# Patient Record
Sex: Female | Born: 1935 | Race: White | Hispanic: No | Smoking: Never smoker
Health system: Southern US, Community
[De-identification: ages and names within clinical notes are randomized; demographics above are authoritative.]

## PROBLEM LIST (undated history)

## (undated) DIAGNOSIS — K5792 Diverticulitis of intestine, part unspecified, without perforation or abscess without bleeding: Secondary | ICD-10-CM

## (undated) DIAGNOSIS — M1990 Unspecified osteoarthritis, unspecified site: Secondary | ICD-10-CM

## (undated) DIAGNOSIS — I447 Left bundle-branch block, unspecified: Secondary | ICD-10-CM

## (undated) DIAGNOSIS — J3089 Other allergic rhinitis: Secondary | ICD-10-CM

## (undated) DIAGNOSIS — I509 Heart failure, unspecified: Secondary | ICD-10-CM

## (undated) DIAGNOSIS — R569 Unspecified convulsions: Secondary | ICD-10-CM

## (undated) DIAGNOSIS — T4145XA Adverse effect of unspecified anesthetic, initial encounter: Secondary | ICD-10-CM

## (undated) DIAGNOSIS — R29898 Other symptoms and signs involving the musculoskeletal system: Secondary | ICD-10-CM

## (undated) DIAGNOSIS — R55 Syncope and collapse: Secondary | ICD-10-CM

## (undated) DIAGNOSIS — I10 Essential (primary) hypertension: Secondary | ICD-10-CM

## (undated) DIAGNOSIS — E7800 Pure hypercholesterolemia, unspecified: Secondary | ICD-10-CM

## (undated) DIAGNOSIS — T753XXA Motion sickness, initial encounter: Secondary | ICD-10-CM

## (undated) DIAGNOSIS — C801 Malignant (primary) neoplasm, unspecified: Secondary | ICD-10-CM

## (undated) DIAGNOSIS — T8859XA Other complications of anesthesia, initial encounter: Secondary | ICD-10-CM

## (undated) HISTORY — PX: BACK SURGERY: SHX140

## (undated) HISTORY — PX: ROTATOR CUFF REPAIR: SHX139

## (undated) HISTORY — PX: OTHER SURGICAL HISTORY: SHX169

## (undated) HISTORY — PX: CATARACT EXTRACTION W/ INTRAOCULAR LENS  IMPLANT, BILATERAL: SHX1307

---

## 2006-08-29 HISTORY — PX: HAMMERTOE RECONSTRUCTION WITH WEIL OSTEOTOMY: SHX5631

## 2008-04-26 HISTORY — PX: KNEE ARTHROCENTESIS: SUR44

## 2008-07-03 ENCOUNTER — Emergency Department (HOSPITAL_COMMUNITY): Admission: EM | Admit: 2008-07-03 | Discharge: 2008-07-03 | Payer: Self-pay | Admitting: Emergency Medicine

## 2013-06-24 DIAGNOSIS — R569 Unspecified convulsions: Secondary | ICD-10-CM

## 2013-06-24 HISTORY — DX: Unspecified convulsions: R56.9

## 2014-04-30 DIAGNOSIS — J3081 Allergic rhinitis due to animal (cat) (dog) hair and dander: Secondary | ICD-10-CM | POA: Insufficient documentation

## 2014-06-08 DIAGNOSIS — K5730 Diverticulosis of large intestine without perforation or abscess without bleeding: Secondary | ICD-10-CM | POA: Insufficient documentation

## 2014-08-27 DIAGNOSIS — Z96651 Presence of right artificial knee joint: Secondary | ICD-10-CM | POA: Insufficient documentation

## 2014-08-27 HISTORY — PX: JOINT REPLACEMENT: SHX530

## 2014-09-01 DIAGNOSIS — I447 Left bundle-branch block, unspecified: Secondary | ICD-10-CM | POA: Insufficient documentation

## 2014-12-10 ENCOUNTER — Encounter: Payer: Self-pay | Admitting: *Deleted

## 2014-12-13 ENCOUNTER — Ambulatory Visit: Payer: Medicare Other | Admitting: Anesthesiology

## 2014-12-15 ENCOUNTER — Encounter
Admission: RE | Disposition: A | Discharge status: Home / Self Care | Payer: Self-pay | Source: Ambulatory Visit | Attending: Unknown Physician Specialty

## 2014-12-15 ENCOUNTER — Ambulatory Visit
Admission: RE | Admit: 2014-12-15 | Discharge: 2014-12-15 | Disposition: A | Discharge status: Home / Self Care | Payer: Medicare Other | Source: Ambulatory Visit | Attending: Unknown Physician Specialty | Admitting: Unknown Physician Specialty

## 2014-12-15 DIAGNOSIS — M1990 Unspecified osteoarthritis, unspecified site: Secondary | ICD-10-CM | POA: Insufficient documentation

## 2014-12-15 DIAGNOSIS — M810 Age-related osteoporosis without current pathological fracture: Secondary | ICD-10-CM | POA: Insufficient documentation

## 2014-12-15 DIAGNOSIS — Z880 Allergy status to penicillin: Secondary | ICD-10-CM | POA: Insufficient documentation

## 2014-12-15 DIAGNOSIS — Z888 Allergy status to other drugs, medicaments and biological substances status: Secondary | ICD-10-CM | POA: Insufficient documentation

## 2014-12-15 DIAGNOSIS — Z91018 Allergy to other foods: Secondary | ICD-10-CM | POA: Diagnosis not present

## 2014-12-15 DIAGNOSIS — Z85820 Personal history of malignant melanoma of skin: Secondary | ICD-10-CM | POA: Diagnosis not present

## 2014-12-15 DIAGNOSIS — I10 Essential (primary) hypertension: Secondary | ICD-10-CM | POA: Insufficient documentation

## 2014-12-15 DIAGNOSIS — E785 Hyperlipidemia, unspecified: Secondary | ICD-10-CM | POA: Insufficient documentation

## 2014-12-15 DIAGNOSIS — Z881 Allergy status to other antibiotic agents status: Secondary | ICD-10-CM | POA: Diagnosis not present

## 2014-12-15 DIAGNOSIS — N830 Follicular cyst of ovary: Secondary | ICD-10-CM | POA: Diagnosis not present

## 2014-12-15 DIAGNOSIS — H409 Unspecified glaucoma: Secondary | ICD-10-CM | POA: Diagnosis not present

## 2014-12-15 DIAGNOSIS — L720 Epidermal cyst: Secondary | ICD-10-CM | POA: Diagnosis not present

## 2014-12-15 HISTORY — DX: Pure hypercholesterolemia, unspecified: E78.00

## 2014-12-15 HISTORY — DX: Other allergic rhinitis: J30.89

## 2014-12-15 HISTORY — DX: Left bundle-branch block, unspecified: I44.7

## 2014-12-15 HISTORY — DX: Syncope and collapse: R55

## 2014-12-15 HISTORY — DX: Adverse effect of unspecified anesthetic, initial encounter: T41.45XA

## 2014-12-15 HISTORY — DX: Motion sickness, initial encounter: T75.3XXA

## 2014-12-15 HISTORY — DX: Other complications of anesthesia, initial encounter: T88.59XA

## 2014-12-15 HISTORY — DX: Other symptoms and signs involving the musculoskeletal system: R29.898

## 2014-12-15 HISTORY — DX: Essential (primary) hypertension: I10

## 2014-12-15 HISTORY — DX: Unspecified convulsions: R56.9

## 2014-12-15 HISTORY — PX: MASS EXCISION: SHX2000

## 2014-12-15 HISTORY — DX: Unspecified osteoarthritis, unspecified site: M19.90

## 2014-12-15 SURGERY — MINOR EXCISION OF MASS
Anesthesia: LOCAL | Laterality: Left | Wound class: Clean

## 2014-12-15 MED ORDER — OXYCODONE HCL 5 MG PO TABS: 5.0000 mg | ORAL_TABLET | Freq: Once | ORAL | Status: DC | PRN

## 2014-12-15 MED ORDER — LACTATED RINGERS IV SOLN: 500.0000 mL | INTRAVENOUS | Status: DC

## 2014-12-15 MED ORDER — OXYCODONE-ACETAMINOPHEN 5-325 MG PO TABS: 1.0000 | ORAL_TABLET | ORAL | Status: DC | PRN

## 2014-12-15 MED ORDER — FENTANYL CITRATE (PF) 100 MCG/2ML IJ SOLN: 25.0000 ug | INTRAMUSCULAR | Status: DC | PRN

## 2014-12-15 MED ORDER — BACITRACIN 500 UNIT/GM EX OINT
TOPICAL_OINTMENT | CUTANEOUS | Status: DC | PRN
  Administered 2014-12-15: 1 via TOPICAL

## 2014-12-15 MED ORDER — OXYCODONE HCL 5 MG/5ML PO SOLN: 5.0000 mg | Freq: Once | ORAL | Status: DC | PRN

## 2014-12-15 MED ORDER — DEXAMETHASONE SODIUM PHOSPHATE 4 MG/ML IJ SOLN: 8.0000 mg | Freq: Once | INTRAMUSCULAR | Status: DC | PRN

## 2014-12-15 MED ORDER — LIDOCAINE-EPINEPHRINE 1 %-1:100000 IJ SOLN
INTRAMUSCULAR | Status: DC | PRN
  Administered 2014-12-15: 1 mL

## 2014-12-15 MED ORDER — LACTATED RINGERS IV SOLN: INTRAVENOUS | Status: DC

## 2014-12-15 MED ORDER — ACETAMINOPHEN 160 MG/5ML PO SOLN: 325.0000 mg | ORAL | Status: DC | PRN

## 2014-12-15 MED ORDER — ACETAMINOPHEN 325 MG PO TABS: 325.0000 mg | ORAL_TABLET | ORAL | Status: DC | PRN

## 2014-12-15 SURGICAL SUPPLY — 28 items
APPLICATOR COTTON TIP WD 3 STR (MISCELLANEOUS) IMPLANT
BLADE SURG 15 STRL LF DISP TIS (BLADE) IMPLANT
BLADE SURG 15 STRL SS (BLADE)
CANISTER SUCT 1200ML W/VALVE (MISCELLANEOUS) IMPLANT
CORD BIP STRL DISP 12FT (MISCELLANEOUS) ×2 IMPLANT
DRAPE HEAD BAR (DRAPES) ×2 IMPLANT
DRESSING TELFA 4X3 1S ST N-ADH (GAUZE/BANDAGES/DRESSINGS) IMPLANT
ELECT CAUTERY BLADE TIP 2.5 (TIP)
ELECT CAUTERY NEEDLE 2.0 MIC (NEEDLE) IMPLANT
ELECTRODE CAUTERY BLDE TIP 2.5 (TIP) IMPLANT
GAUZE SPONGE 4X4 12PLY STRL (GAUZE/BANDAGES/DRESSINGS) ×2 IMPLANT
GLOVE BIO SURGEON STRL SZ7.5 (GLOVE) ×4 IMPLANT
LIQUID BAND (GAUZE/BANDAGES/DRESSINGS) IMPLANT
NEEDLE HYPO 25GX1X1/2 BEV (NEEDLE) ×2 IMPLANT
NS IRRIG 500ML POUR BTL (IV SOLUTION) ×2 IMPLANT
PACK DRAPE NASAL/ENT (PACKS) ×2 IMPLANT
PAD GROUND ADULT SPLIT (MISCELLANEOUS) IMPLANT
PENCIL ELECTRO HAND CTR (MISCELLANEOUS) IMPLANT
SOL PREP PVP 2OZ (MISCELLANEOUS) ×2
SOLUTION PREP PVP 2OZ (MISCELLANEOUS) ×1 IMPLANT
STRAP BODY AND KNEE 60X3 (MISCELLANEOUS) ×2 IMPLANT
SUCTION FRAZIER TIP 10 FR DISP (SUCTIONS) IMPLANT
SUT PROLENE 5 0 P 3 (SUTURE) ×2 IMPLANT
SUT VIC AB 4-0 RB1 27 (SUTURE)
SUT VIC AB 4-0 RB1 27X BRD (SUTURE) IMPLANT
SUT VICRYL UND BR 6 0 P 3 (SUTURE) ×2 IMPLANT
SYRINGE 10CC LL (SYRINGE) ×2 IMPLANT
TOWEL OR 17X26 4PK STRL BLUE (TOWEL DISPOSABLE) ×2 IMPLANT

## 2014-12-15 NOTE — Progress Notes (Signed)
Wrote in dressing care instructions on AVS. Dressing may be removed in 1-2 days & then leave open to air. Keep clean & dry, no antibiotic ointment is required. Stitches will be removed at 1 week follow up appt. Follow up appt made for Wednesday 9/28 @ 1045. This all was relayed to pt & spouse, who both verbalize understanding.

## 2014-12-15 NOTE — Op Note (Signed)
12/15/2014  9:45 AM    [name], Maria Go  [id number]   Pre-Op Dx: NEOPLASM UNCERTAIN BEHAVIOR FACE  Post-op Dx: SAME  Proc: Excision of epidermal inclusion cyst left   Surg:  Beverly Gust T  Anes: Local  EBL:  Less than 5 cc  Comp:  None  Findings:  Approximately 1 x 1 cm inclusion cyst  Procedure: Joint was then found 40 area taken to the operating room placed supine position. A cystic mass was identified just lateral and overlying the zygoma on the left there was prepped sterilely a local anesthetic of 1% lidocaine with 1 100,000 units of epinephrine was used to inject around the cyst and incision line was marked in a natural skin crease with the area prepped and draped sterilely 15 blade was used to incise down to the subcutaneous tissues. Short sharp scissors were then used to excise around the cyst removing its entirety. 2 small bleeding points were cauterized using the microbipolar. The wounds and copious irrigated with saline with no active bleeding the incision was closed using a subcutaneous 6-0 Vicryl the skin was closed using 5-0 Prolene patient is a return anesthesia where she was awakened and taken recovery room stable condition.  Specimen: Inclusion cyst  Dispo:   Good  Plan:  Patient will be discharged home for follow-up in one week  MCQUEEN,CHAPMAN T  12/15/2014 9:45 AM

## 2014-12-15 NOTE — Anesthesia Preprocedure Evaluation (Deleted)
Anesthesia Evaluation  Patient identified by MRN, date of birth, ID band Patient awake    Reviewed: Allergy & Precautions, H&P , NPO status , Patient's Chart, lab work & pertinent test results, reviewed documented beta blocker date and time   History of Anesthesia Complications (+) history of anesthetic complications  Airway Mallampati: II  TM Distance: >3 FB Neck ROM: full    Dental no notable dental hx.    Pulmonary neg pulmonary ROS,    Pulmonary exam normal breath sounds clear to auscultation       Cardiovascular Exercise Tolerance: Good hypertension, negative cardio ROS   Rhythm:regular Rate:Normal     Neuro/Psych negative neurological ROS  negative psych ROS   GI/Hepatic negative GI ROS, Neg liver ROS,   Endo/Other  negative endocrine ROS  Renal/GU negative Renal ROS  negative genitourinary   Musculoskeletal   Abdominal   Peds  Hematology negative hematology ROS (+)   Anesthesia Other Findings   Reproductive/Obstetrics negative OB ROS                             Anesthesia Physical Anesthesia Plan  ASA: III  Anesthesia Plan:    Post-op Pain Management:    Induction:   Airway Management Planned:   Additional Equipment:   Intra-op Plan:   Post-operative Plan:   Informed Consent: I have reviewed the patients History and Physical, chart, labs and discussed the procedure including the risks, benefits and alternatives for the proposed anesthesia with the patient or authorized representative who has indicated his/her understanding and acceptance.     Plan Discussed with: CRNA  Anesthesia Plan Comments:         Anesthesia Quick Evaluation

## 2014-12-15 NOTE — H&P (Signed)
  H+P  Reviewed and will be scanned in later. No changes noted. 

## 2014-12-15 NOTE — OR Nursing (Signed)
Vitals at case start 0932 BP 176/83 RR 20 Temp 21C HR 73 Vitals at case end 0945 BP 163/38 RR 20 Temp 21C HR 71

## 2014-12-16 ENCOUNTER — Encounter: Payer: Self-pay | Admitting: Unknown Physician Specialty

## 2014-12-17 LAB — SURGICAL PATHOLOGY

## 2015-03-16 DIAGNOSIS — E782 Mixed hyperlipidemia: Secondary | ICD-10-CM | POA: Insufficient documentation

## 2015-03-16 DIAGNOSIS — I10 Essential (primary) hypertension: Secondary | ICD-10-CM | POA: Insufficient documentation

## 2015-03-24 DIAGNOSIS — I5022 Chronic systolic (congestive) heart failure: Secondary | ICD-10-CM | POA: Insufficient documentation

## 2015-06-25 HISTORY — PX: LUMBAR LAMINECTOMY: SHX95

## 2015-10-11 ENCOUNTER — Encounter: Payer: Self-pay | Admitting: Emergency Medicine

## 2015-10-11 ENCOUNTER — Emergency Department
Admission: EM | Admit: 2015-10-11 | Discharge: 2015-10-12 | Disposition: A | Discharge status: Home / Self Care | Payer: Medicare Other | Attending: Emergency Medicine | Admitting: Emergency Medicine

## 2015-10-11 ENCOUNTER — Emergency Department: Payer: Medicare Other

## 2015-10-11 DIAGNOSIS — M179 Osteoarthritis of knee, unspecified: Secondary | ICD-10-CM | POA: Diagnosis not present

## 2015-10-11 DIAGNOSIS — Z792 Long term (current) use of antibiotics: Secondary | ICD-10-CM | POA: Insufficient documentation

## 2015-10-11 DIAGNOSIS — Z8679 Personal history of other diseases of the circulatory system: Secondary | ICD-10-CM | POA: Diagnosis not present

## 2015-10-11 DIAGNOSIS — I110 Hypertensive heart disease with heart failure: Secondary | ICD-10-CM | POA: Insufficient documentation

## 2015-10-11 DIAGNOSIS — I509 Heart failure, unspecified: Secondary | ICD-10-CM | POA: Insufficient documentation

## 2015-10-11 DIAGNOSIS — Z79899 Other long term (current) drug therapy: Secondary | ICD-10-CM | POA: Diagnosis not present

## 2015-10-11 DIAGNOSIS — T3695XA Adverse effect of unspecified systemic antibiotic, initial encounter: Secondary | ICD-10-CM | POA: Insufficient documentation

## 2015-10-11 DIAGNOSIS — K5792 Diverticulitis of intestine, part unspecified, without perforation or abscess without bleeding: Secondary | ICD-10-CM | POA: Diagnosis not present

## 2015-10-11 DIAGNOSIS — R1032 Left lower quadrant pain: Secondary | ICD-10-CM | POA: Diagnosis present

## 2015-10-11 DIAGNOSIS — T7840XA Allergy, unspecified, initial encounter: Secondary | ICD-10-CM | POA: Diagnosis not present

## 2015-10-11 DIAGNOSIS — T50905A Adverse effect of unspecified drugs, medicaments and biological substances, initial encounter: Secondary | ICD-10-CM

## 2015-10-11 HISTORY — DX: Heart failure, unspecified: I50.9

## 2015-10-11 LAB — BASIC METABOLIC PANEL
ANION GAP: 10 (ref 5–15)
BUN: 11 mg/dL (ref 6–20)
CALCIUM: 9.2 mg/dL (ref 8.9–10.3)
CHLORIDE: 95 mmol/L — AB (ref 101–111)
CO2: 26 mmol/L (ref 22–32)
CREATININE: 0.57 mg/dL (ref 0.44–1.00)
GFR calc Af Amer: >60 mL/min (ref >60)
GFR calc non Af Amer: >60 mL/min (ref >60)
Glucose, Bld: 131 mg/dL — ABNORMAL HIGH (ref 65–99)
POTASSIUM: 3.3 mmol/L — AB (ref 3.5–5.1)
Sodium: 131 mmol/L — ABNORMAL LOW (ref 135–145)

## 2015-10-11 LAB — CBC
HEMATOCRIT: 37.7 % (ref 35.0–47.0)
HEMOGLOBIN: 13.1 g/dL (ref 12.0–16.0)
MCH: 31.3 pg (ref 26.0–34.0)
MCHC: 34.7 g/dL (ref 32.0–36.0)
MCV: 90 fL (ref 80.0–100.0)
Platelets: 302 10*3/uL (ref 150–440)
RBC: 4.19 MIL/uL (ref 3.80–5.20)
RDW: 13.1 % (ref 11.5–14.5)
WBC: 6.5 10*3/uL (ref 3.6–11.0)

## 2015-10-11 MED ORDER — DIATRIZOATE MEGLUMINE & SODIUM 66-10 % PO SOLN
15.0000 mL | Freq: Once | ORAL | Status: AC
  Administered 2015-10-11: 15 mL via ORAL

## 2015-10-11 MED ORDER — IOPAMIDOL (ISOVUE-300) INJECTION 61%
100.0000 mL | Freq: Once | INTRAVENOUS | Status: AC | PRN
  Administered 2015-10-11: 100 mL via INTRAVENOUS

## 2015-10-11 NOTE — Discharge Instructions (Signed)
We believe your symptoms are caused by diverticulitis.  Most of the time this condition (please read through the included information) can be cured with outpatient antibiotics.  Unfortunately you have allergies listed to all of the antibiotics typically used.  As we discussed, rather than causing more potential issues by giving U another medication, we feel you should follow-up with Ms. Jerelene Redden to determine additional treatment options.  We believe he should continue taking the metronidazole (Flagyl) at this time.  Return to the ED if your abdominal pain worsens or fails to improve, you develop bloody vomiting, bloody diarrhea, you are unable to tolerate fluids due to vomiting, fever greater than 101, or other symptoms that concern you.    Diverticulitis Diverticulitis is inflammation or infection of small pouches in your colon that form when you have a condition called diverticulosis. The pouches in your colon are called diverticula. Your colon, or large intestine, is where water is absorbed and stool is formed. Complications of diverticulitis can include:  Bleeding.  Severe infection.  Severe pain.  Perforation of your colon.  Obstruction of your colon. CAUSES  Diverticulitis is caused by bacteria. Diverticulitis happens when stool becomes trapped in diverticula. This allows bacteria to grow in the diverticula, which can lead to inflammation and infection. RISK FACTORS People with diverticulosis are at risk for diverticulitis. Eating a diet that does not include enough fiber from fruits and vegetables may make diverticulitis more likely to develop. SYMPTOMS  Symptoms of diverticulitis may include:  Abdominal pain and tenderness. The pain is normally located on the left side of the abdomen, but may occur in other areas.  Fever and chills.  Bloating.  Cramping.  Nausea.  Vomiting.  Constipation.  Diarrhea.  Blood in your stool. DIAGNOSIS  Your health care provider will ask  you about your medical history and do a physical exam. You may need to have tests done because many medical conditions can cause the same symptoms as diverticulitis. Tests may include:  Blood tests.  Urine tests.  Imaging tests of the abdomen, including X-rays and CT scans. When your condition is under control, your health care provider may recommend that you have a colonoscopy. A colonoscopy can show how severe your diverticula are and whether something else is causing your symptoms. TREATMENT  Most cases of diverticulitis are mild and can be treated at home. Treatment may include:  Taking over-the-counter pain medicines.  Following a clear liquid diet.  Taking antibiotic medicines by mouth for 7-10 days. More severe cases may be treated at a hospital. Treatment may include:  Not eating or drinking.  Taking prescription pain medicine.  Receiving antibiotic medicines through an IV tube.  Receiving fluids and nutrition through an IV tube.  Surgery. HOME CARE INSTRUCTIONS   Follow your health care provider's instructions carefully.  Follow a full liquid diet or other diet as directed by your health care provider. After your symptoms improve, your health care provider may tell you to change your diet. He or she may recommend you eat a high-fiber diet. Fruits and vegetables are good sources of fiber. Fiber makes it easier to pass stool.  Take fiber supplements or probiotics as directed by your health care provider.  Only take medicines as directed by your health care provider.  Keep all your follow-up appointments. SEEK MEDICAL CARE IF:   Your pain does not improve.  You have a hard time eating food.  Your bowel movements do not return to normal. SEEK IMMEDIATE MEDICAL CARE IF:  Your pain becomes worse.  Your symptoms do not get better.  Your symptoms suddenly get worse.  You have a fever.  You have repeated vomiting.  You have bloody or black, tarry  stools. MAKE SURE YOU:   Understand these instructions.  Will watch your condition.  Will get help right away if you are not doing well or get worse. Document Released: 12/20/2004 Document Revised: 03/17/2013 Document Reviewed: 02/04/2013 [facility] Patient Information 2015 [place], [place]. This information is not intended to replace advice given to you by your health care provider. Make sure you discuss any questions you have with your health care provider.

## 2015-10-11 NOTE — ED Notes (Signed)
Pt arrived to the ED accompanied by her daughter for an allergic reaction. Pt states that she just started a new medication and started to feel itchy all over with tingling on her lips. Pt is AOx4 anxious. Pt taken to a room.

## 2015-10-11 NOTE — ED Provider Notes (Signed)
[facility] Emergency Department Provider Note  ____________________________________________  Time seen: Approximately 8:14 PM  I have reviewed the triage vital signs and the nursing notes.   HISTORY  Chief Complaint Allergic Reaction    HPI Maria [name] is a 82 y.o. female with an extensive history of drug allergies who presents for evaluation of what is apparently acute onset drug reaction.She reports that she has a number of drug allergies but she has never had Bactrim/Septra in the past.  She has had a history of diverticulitis and has been having left lower quadrant abdominal pain for some time now.  She was being treated empirically for diverticulitis without any imaging being available.  She was originally on Levaquin and Flagyl, but reportedly her nurse practitioner at the GI clinic today and recommended that she try Bactrim to see if that would work.  She reports that about 15 minutes after taking Bactrim she felt flushing to her face, tingling on her lips, itching all over, all of which she associates with allergic reaction to taking medications.  She has had no difficulty breathing, no difficulty swallowing, no change in voice, and denies chest pain, shortness of breath, abdominal pain, nausea/vomiting/diarrhea.  She describes the symptoms as moderate.    Abd pain is currently mild, worse with palpation.  Has been steady for "quick awhile".  Nothing makes better, nothing makes worse.   Past Medical History  Diagnosis Date  . Complication of anesthesia     Pt reports "coded" during spinal injection during back surgery - 8 yrs ago - [place], [place]  . Vasovagal episode   . Seizures ([place]) 06/2013    reaction to exposure to allergan (dogs and cats)  . Left bundle branch block (LBBB)   . Hypertension   . Environmental and seasonal allergies   . Arthritis     fingers, knees  . Motion sickness     seasick  . Left arm weakness     positional  .  Hypercholesteremia   . CHF (congestive heart failure) ([place])     There are no active problems to display for this patient.   Past Surgical History  Procedure Laterality Date  . Rotator cuff repair Bilateral   . Cataract extraction w/ intraocular lens  implant, bilateral    . Back surgery      multiple  . Joint replacement      knee  . Mass excision Left 12/15/2014    Procedure: MINOR EXCISION OF MASS LEFT INCLUSION CYST;  Surgeon: Beverly Gust, MD;  Location: [place];  Service: ENT;  Laterality: Left;  NEEDS 2 NURSE    Current Outpatient Rx  Name  Route  Sig  Dispense  Refill  . acetaminophen (TYLENOL) 500 MG tablet   Oral   Take 500-1,000 mg by mouth 2 (two) times daily. 1000 mg every morning and 500 mg at bedtime         . clindamycin (CLEOCIN) 300 MG capsule   Oral   Take 600 mg by mouth once. 60 minutes prior to dental appointment         . latanoprost (XALATAN) 0.005 % ophthalmic solution   Both Eyes   Place 1 drop into both eyes at bedtime.          Marland Kitchen levofloxacin (LEVAQUIN) 750 MG tablet   Oral   Take 750 mg by mouth daily. For 7 days         . metroNIDAZOLE (FLAGYL) 500 MG tablet  Oral   Take 500 mg by mouth 3 (three) times daily. For 7 days         . ramipril (ALTACE) 10 MG capsule   Oral   Take 10 mg by mouth daily.         Marland Kitchen sulfamethoxazole-trimethoprim (BACTRIM DS,SEPTRA DS) 800-160 MG tablet   Oral   Take 1 tablet by mouth 2 (two) times daily. For 7 days         . timolol (TIMOPTIC) 0.5 % ophthalmic solution   Both Eyes   Place 1 drop into both eyes every morning.           Allergies Darvon; Metronidazole; Other; Penicillins; Erythromycin base; Macrobid; Spinach; Sulfamethoxazole-trimethoprim; Tape; and Tetracyclines & related  History reviewed. No pertinent family history.  Social History Social History  Substance Use Topics  . Smoking status: Never Smoker   . Smokeless tobacco: None  . Alcohol Use: No     Review of Systems Constitutional: No fever/chills Eyes: No visual changes. ENT: No sore throat. Cardiovascular: Denies chest pain. Respiratory: Denies shortness of breath. Gastrointestinal: +abdominal pain (essentially chronic).  No nausea, no vomiting.  No diarrhea.  No constipation. Genitourinary: Negative for dysuria. Musculoskeletal: Negative for back pain. Skin: Itching all over, some erythema,  Neurological: Tingling around mouth.  No focal weakness nor numbness.  10-point ROS otherwise negative.  ____________________________________________   PHYSICAL EXAM:  VITAL SIGNS: ED Triage Vitals  Enc Vitals Group     BP 10/11/15 1949 171/72 mmHg     Pulse Rate 10/11/15 1949 73     Resp 10/11/15 1949 18     Temp 10/11/15 1949 98.3 F (36.8 C)     Temp Source 10/11/15 1949 Oral     SpO2 10/11/15 1949 100 %     Weight 10/11/15 1949 128 lb (58.06 kg)     Height 10/11/15 1949 5\' 4"  (1.626 m)     Head Cir --      Peak Flow --      Pain Score 10/11/15 1952 0     Pain Loc --      Pain Edu? --      Excl. in [place]? --     Constitutional: Alert and oriented. Well appearing and in no acute distress. Eyes: Conjunctivae are normal. PERRL. EOMI. Head: Atraumatic. Nose: No congestion/rhinnorhea. Mouth/Throat: Mucous membranes are moist.  Oropharynx non-erythematous. Neck: No stridor.  No meningeal signs.   Cardiovascular: Normal rate, regular rhythm. Good peripheral circulation. Grossly normal heart sounds.   Respiratory: Normal respiratory effort.  No retractions. Lungs CTAB. Gastrointestinal: Soft and nontender. No distention.  Musculoskeletal: No lower extremity tenderness nor edema. No gross deformities of extremities. Neurologic:  Normal speech and language. No gross focal neurologic deficits are appreciated.  Skin:  Skin is warm, dry and intact. No rash noted.   ____________________________________________   LABS (all labs ordered are listed, but only abnormal results  are displayed)  Labs Reviewed  BASIC METABOLIC PANEL - Abnormal; Notable for the following:    Sodium 131 (*)    Potassium 3.3 (*)    Chloride 95 (*)    Glucose, Bld 131 (*)    All other components within normal limits  CBC   ____________________________________________  EKG  None ____________________________________________  RADIOLOGY   Ct Abdomen Pelvis W Contrast  10/11/2015  CLINICAL DATA:  Left lower quadrant abdominal pain EXAM: CT ABDOMEN AND PELVIS WITH CONTRAST TECHNIQUE: Multidetector CT imaging of the abdomen and pelvis was performed using  the standard protocol following bolus administration of intravenous contrast. CONTRAST:  178mL ISOVUE-300 IOPAMIDOL (ISOVUE-300) INJECTION 61% COMPARISON:  None. FINDINGS: Lower chest: No pulmonary nodules or masses identified within the lung bases. No pleural effusion or pneumothorax. Hepatobiliary: No focal liver lesions. No biliary dilatation. The gallbladder is normal. Pancreas: Normal.  No pancreatic ductal dilatation. Spleen: Small calcification at the splenic hilum.  Otherwise normal. Adrenals/Urinary Tract: The adrenal glands are normal. Large left renal cyst measures up to 3.7 cm. The kidneys are otherwise normal. No hydronephrosis or hydroureter. Stomach/Bowel: There is extensive rectosigmoid colonic diverticulosis with focal colonic wall thickening of the sigmoid colon along the left pelvic side wall (series 2, image 53) with surrounding inflammatory infiltration of the adjacent fat. There is trace pelvic fluid. No evidence of abscess formation. No free intraperitoneal air. No dilated small bowel. The remainder of the colon is normal. Stomach is unremarkable. The appendix is normal. Vascular/Lymphatic: There is aortic atherosclerosis. The inferior vena cava, portal vein, splenic vein and superior mesenteric vein are patent. The aorta, bilateral renal arteries, celiac axis and superior mesenteric artery are patent. The visualized iliac  vessels are normal. There are multiple subcentimeter retroperitoneal lymph nodes measuring up to 6 mm. Genitourinary:  The uterus is normal.  The ovaries are unremarkable. Other: Unremarkable extraperitoneal soft tissues. Musculoskeletal: Lumbar spondylosis greatest at L4-L5. No lytic or blastic osseous lesions. Left laminectomies at L3 and L4. IMPRESSION: 1. Sigmoid colonic wall thickening with associated surrounding inflammatory stranding, consistent with acute diverticulitis. No evidence of perforation or abscess formation. 2. Aortic atherosclerosis. Electronically Signed   By: Ulyses Jarred M.D.   On: 10/11/2015 21:52    ____________________________________________   PROCEDURES  Procedure(s) performed:   Procedures   ____________________________________________   INITIAL IMPRESSION / ASSESSMENT AND PLAN / ED COURSE  Pertinent labs & imaging results that were available during my care of the patient were reviewed by me and considered in my medical decision making (see chart for details).  The patient's CT scan confirmed acute diverticulitis.  I looked up with online resources and also spoke in person with Dr. Burt Knack and another one of the emergency medicine physician's about alternative treatment regimens, and she has listed allergies to all of them. (Specifically this includes ciprofloxacin, levofloxacin, penicillins which eliminates the possibility of Augmentin, and moxifloxacin.)  As a result, rather than cause more problems, I encouraged her to continue taking metronidazole and follow up with her nurse practitioner with gastroenterology first thing in the morning to determine the appropriate outpatient regimen.  She and her daughter agree with this plan.  I gave my usual and customary return precautions.      ____________________________________________  FINAL CLINICAL IMPRESSION(S) / ED DIAGNOSES  Final diagnoses:  Acute diverticulitis  Drug reaction, initial encounter      MEDICATIONS GIVEN DURING THIS VISIT:  Medications  diatrizoate meglumine-sodium (GASTROGRAFIN) 66-10 % solution 15 mL (15 mLs Oral Given 10/11/15 2101)  iopamidol (ISOVUE-300) 61 % injection 100 mL (100 mLs Intravenous Contrast Given 10/11/15 2130)     NEW OUTPATIENT MEDICATIONS STARTED DURING THIS VISIT:  Discharge Medication List as of 10/11/2015 11:58 PM        Note:  This document was prepared using Dragon voice recognition software and may include unintentional dictation errors.   Hinda Kehr, MD 10/12/15 [phone]

## 2015-10-17 DIAGNOSIS — K5732 Diverticulitis of large intestine without perforation or abscess without bleeding: Secondary | ICD-10-CM | POA: Insufficient documentation

## 2015-11-06 NOTE — Progress Notes (Signed)
11/07/2015 4:59 PM   Maria [name] [birth year] [id number]  Referring provider: Kirk Ruths, MD [address] [place], [place] [address]  Chief Complaint  Patient presents with  . Recurrent UTI    new patient referred by Denice Paradise NP    HPI: Patient is a 82 -year-old Caucasian female who presents today as a referral from Denice Paradise, NP, for gross hematuria.  Patient was found to have microscopic hematuria over the last year with 4-10 RBC's/hpf.  Patient does have a prior history of microscopic hematuria.    She does has a prior history of recurrent urinary tract infections, but she denies nephrolithiasis, trauma to the genitourinary tract and malignancies of the genitourinary tract.   She does not have a family medical history of nephrolithiasis (mother).  She does not have a family medical history malignancies of the genitourinary tract or hematuria.   Today, she is having symptoms of urgency and nocturia (intermittent) She denies frequency, dysuria, incontinence, hesitancy, intermittency, straining to urinate or a weak urinary stream.  Her UA today demonstrates 3-11 RBC's/hpf.  She not experiencing any suprapubic pain, abdominal pain or flank pain.  She denies any recent fevers, chills, nausea or vomiting.   She did have a CT abd/pelvis with contrast in 09/2015 for LLQ and was found to have diverticulitis and a large left renal cyst (3.7 cm)  She is not a smoker.   She is not exposed to secondhand smoke.  She has not worked with [employer].   Patient had pressured speech and had a difficult time staying on topic during the history taking.     PMH: Past Medical History:  Diagnosis Date  . Arthritis    fingers, knees  . CHF (congestive heart failure) ([place])   . Complication of anesthesia    Pt reports "coded" during spinal injection during back surgery - 8 yrs ago - [place], [place]  . Environmental and seasonal allergies     . Hypercholesteremia   . Hypertension   . Left arm weakness    positional  . Left bundle branch block (LBBB)   . Motion sickness    seasick  . Seizures ([place]) 06/2013   reaction to exposure to allergan (dogs and cats)  . Vasovagal episode     Surgical History: Past Surgical History:  Procedure Laterality Date  . BACK SURGERY     multiple  . CATARACT EXTRACTION W/ INTRAOCULAR LENS  IMPLANT, BILATERAL    . JOINT REPLACEMENT     knee  . MASS EXCISION Left 12/15/2014   Procedure: MINOR EXCISION OF MASS LEFT INCLUSION CYST;  Surgeon: Beverly Gust, MD;  Location: [facility];  Service: ENT;  Laterality: Left;  NEEDS 2 NURSE  . ROTATOR CUFF REPAIR Bilateral     Home Medications:    Medication List       Accurate as of 11/07/15 11:59 PM. Always use your most recent med list.          acetaminophen 500 MG tablet Commonly known as:  TYLENOL Take 500-1,000 mg by mouth 2 (two) times daily. 1000 mg every morning and 500 mg at bedtime   clindamycin 300 MG capsule Commonly known as:  CLEOCIN Take 600 mg by mouth once. 60 minutes prior to dental appointment   latanoprost 0.005 % ophthalmic solution Commonly known as:  XALATAN Place 1 drop into both eyes at bedtime.   levofloxacin 750 MG tablet Commonly known as:  LEVAQUIN  Take 750 mg by mouth daily. For 7 days   metroNIDAZOLE 500 MG tablet Commonly known as:  FLAGYL Take 500 mg by mouth 3 (three) times daily. For 7 days   PROBIOTIC-10 PO Take by mouth.   ramipril 10 MG capsule Commonly known as:  ALTACE Take 10 mg by mouth daily.   sulfamethoxazole-trimethoprim 800-160 MG tablet Commonly known as:  BACTRIM DS,SEPTRA DS Take 1 tablet by mouth 2 (two) times daily. For 7 days   timolol 0.5 % ophthalmic solution Commonly known as:  TIMOPTIC Place 1 drop into both eyes every morning.       Allergies:  Allergies  Allergen Reactions  . Penicillin G Hives    AGE 22 yo PCN allergy  . Sulfa Antibiotics  Other (See Comments) and Anaphylaxis  . Sulfamethoxazole-Trimethoprim Itching, Palpitations, Other (See Comments), Rash and Swelling    Shaking, redness, sneezing  . Articaine-Epinephrine Other (See Comments)  . Darvon [Propoxyphene] Other (See Comments)    Altered mental status  . Erythromycin Other (See Comments)  . Guaifenesin Other (See Comments)    Insomnia and anxiety  . Levofloxacin Other (See Comments)    750mg  dosage after 24 hours caused confusion, off-balanced CNS  . Metronidazole Nausea Only    Gi upset  . Other Other (See Comments)    septocaine caused numbness  . Penicillins Swelling  . Statins Other (See Comments)  . Tetracycline Other (See Comments)  . Erythromycin Base Rash    GI upset  . Macrobid [Nitrofurantoin] Rash  . Spinach Palpitations    Irregular heartbeat  . Tape Rash  . Tetracyclines & Related Rash    Family History: Family History  Problem Relation Age of Onset  . Kidney Stones Mother   . Kidney disease Neg Hx     Social History:  reports that she has never smoked. She does not have any smokeless tobacco history on file. She reports that she does not drink alcohol. Her drug history is not on file.  ROS: UROLOGY Frequent Urination?: No Hard to postpone urination?: Yes Burning/pain with urination?: No Get up at night to urinate?: Yes Leakage of urine?: No Urine stream starts and stops?: No Trouble starting stream?: No Do you have to strain to urinate?: No Blood in urine?: Yes Urinary tract infection?: No Sexually transmitted disease?: No Injury to kidneys or bladder?: No Painful intercourse?: No Weak stream?: No Currently pregnant?: No Vaginal bleeding?: No Last menstrual period?: n  Gastrointestinal Nausea?: Yes Vomiting?: No Indigestion/heartburn?: Yes Diarrhea?: Yes Constipation?: Yes  Constitutional Fever: Yes Night sweats?: No Weight loss?: No Fatigue?: Yes  Skin Skin rash/lesions?: No Itching?: No  Eyes Blurred  vision?: Yes Double vision?: No  Ears/Nose/Throat Sore throat?: No Sinus problems?: No  Hematologic/Lymphatic Swollen glands?: No Easy bruising?: Yes  Cardiovascular Leg swelling?: No Chest pain?: No  Respiratory Cough?: Yes Shortness of breath?: Yes  Endocrine Excessive thirst?: No  Musculoskeletal Back pain?: No Joint pain?: Yes  Neurological Headaches?: No Dizziness?: Yes  Psychologic Depression?: No Anxiety?: No  Physical Exam: BP (!) 166/81   Pulse 64   Temp 97.9 F (36.6 C) (Oral)   Ht 5' 2.5" (1.588 m)   Wt 123 lb 6.4 oz (56 kg)   BMI 22.21 kg/m   Constitutional: Well nourished. Alert and oriented, No acute distress. HEENT: [place] AT, moist mucus membranes. Trachea midline, no masses. Cardiovascular: No clubbing, cyanosis, or edema. Respiratory: Normal respiratory effort, no increased work of breathing. GI: Abdomen is soft, non tender, non  distended, no abdominal masses. Liver and spleen not palpable.  No hernias appreciated.  Stool sample for occult testing is not indicated.   GU: No CVA tenderness.  No bladder fullness or masses.   Skin: No rashes, bruises or suspicious lesions. Lymph: No cervical or inguinal adenopathy. Neurologic: Grossly intact, no focal deficits, moving all 4 extremities. Psychiatric: Normal mood and affect.  Laboratory Data: Lab Results  Component Value Date   WBC 6.5 10/11/2015   HGB 13.1 10/11/2015   HCT 37.7 10/11/2015   MCV 90.0 10/11/2015   PLT 302 10/11/2015    Lab Results  Component Value Date   CREATININE 0.57 10/11/2015    Urinalysis Significant for 3-11 RBC's/hpf.  See EPIC.  Pertinent Imaging: CLINICAL DATA:  Left lower quadrant abdominal pain  EXAM: CT ABDOMEN AND PELVIS WITH CONTRAST  TECHNIQUE: Multidetector CT imaging of the abdomen and pelvis was performed using the standard protocol following bolus administration of intravenous contrast.  CONTRAST:  125mL ISOVUE-300 IOPAMIDOL  (ISOVUE-300) INJECTION 61%  COMPARISON:  None.  FINDINGS: Lower chest: No pulmonary nodules or masses identified within the lung bases. No pleural effusion or pneumothorax.  Hepatobiliary: No focal liver lesions. No biliary dilatation. The gallbladder is normal.  Pancreas: Normal.  No pancreatic ductal dilatation.  Spleen: Small calcification at the splenic hilum.  Otherwise normal.  Adrenals/Urinary Tract: The adrenal glands are normal. Large left renal cyst measures up to 3.7 cm. The kidneys are otherwise normal. No hydronephrosis or hydroureter.  Stomach/Bowel: There is extensive rectosigmoid colonic diverticulosis with focal colonic wall thickening of the sigmoid colon along the left pelvic side wall (series 2, image 53) with surrounding inflammatory infiltration of the adjacent fat. There is trace pelvic fluid. No evidence of abscess formation. No free intraperitoneal air. No dilated small bowel. The remainder of the colon is normal. Stomach is unremarkable. The appendix is normal.  Vascular/Lymphatic: There is aortic atherosclerosis. The inferior vena cava, portal vein, splenic vein and superior mesenteric vein are patent. The aorta, bilateral renal arteries, celiac axis and superior mesenteric artery are patent. The visualized iliac vessels are normal. There are multiple subcentimeter retroperitoneal lymph nodes measuring up to 6 mm.  Genitourinary:  The uterus is normal.  The ovaries are unremarkable.  Other: Unremarkable extraperitoneal soft tissues.  Musculoskeletal: Lumbar spondylosis greatest at L4-L5. No lytic or blastic osseous lesions. Left laminectomies at L3 and L4.  IMPRESSION: 1. Sigmoid colonic wall thickening with associated surrounding inflammatory stranding, consistent with acute diverticulitis. No evidence of perforation or abscess formation. 2. Aortic atherosclerosis.   Electronically Signed   By: Ulyses Jarred M.D.   On:  10/11/2015 21:52   Assessment & Plan:    1. Gross hematuria  - explained to the patient that hematuria may be the result of urinary stones, infection and/or urological tumors  - patient has had a CT abd/pelvis w contrast which noted large left renal cyst measures up to 3.7 cm  - discussed with the possibility of missing renal/ureteral cancer without performing a CT Urogram  - offered cystoscopy with retrogrades or CT Urogram, but she would like to hold on CT Urogram or cystoscopy in the OR with retrogrades at this time  - schedule cystoscopy in the office  - UA  - urine culture  Return for cystoscopy for hematuria.  These notes generated with voice recognition software. I apologize for typographical errors.  Zara Council, [facility] [address], [place], [place] [address] [phone]

## 2015-11-07 ENCOUNTER — Encounter: Payer: Self-pay | Admitting: Urology

## 2015-11-07 ENCOUNTER — Ambulatory Visit (INDEPENDENT_AMBULATORY_CARE_PROVIDER_SITE_OTHER): Payer: Medicare Other | Admitting: Urology

## 2015-11-07 VITALS — BP 166/81 | HR 64 | Temp 97.9°F | Ht 62.5 in | Wt 123.4 lb

## 2015-11-07 DIAGNOSIS — R310 Gross hematuria: Secondary | ICD-10-CM

## 2015-11-07 LAB — URINALYSIS, COMPLETE
Bilirubin, UA: NEGATIVE
Glucose, UA: NEGATIVE
Ketones, UA: NEGATIVE
LEUKOCYTES UA: NEGATIVE
Nitrite, UA: NEGATIVE
PH UA: 7.5 (ref 5.0–7.5)
PROTEIN UA: NEGATIVE
Specific Gravity, UA: 1.01 (ref 1.005–1.030)
Urobilinogen, Ur: 0.2 mg/dL (ref 0.2–1.0)

## 2015-11-07 LAB — MICROSCOPIC EXAMINATION
BACTERIA UA: NONE SEEN
WBC, UA: NONE SEEN /hpf (ref 0–?)

## 2015-11-10 LAB — CULTURE, URINE COMPREHENSIVE

## 2015-12-08 ENCOUNTER — Ambulatory Visit: Payer: Medicare Other | Admitting: Family Medicine

## 2015-12-08 VITALS — BP 151/80 | HR 60 | Ht 62.5 in | Wt 125.3 lb

## 2015-12-08 DIAGNOSIS — R310 Gross hematuria: Secondary | ICD-10-CM

## 2015-12-08 LAB — URINALYSIS, COMPLETE
Bilirubin, UA: NEGATIVE
Glucose, UA: NEGATIVE
KETONES UA: NEGATIVE
Nitrite, UA: POSITIVE — AB
SPEC GRAV UA: 1.015 (ref 1.005–1.030)
Urobilinogen, Ur: 0.2 mg/dL (ref 0.2–1.0)
pH, UA: 6.5 (ref 5.0–7.5)

## 2015-12-08 LAB — MICROSCOPIC EXAMINATION: WBC, UA: >30 /hpf — AB (ref 0–?)

## 2015-12-08 NOTE — Progress Notes (Signed)
Patient present today with urinary frequency, burning, blood in urine. She complains of fever chills and urinay urgency. She has been on antibiotics in the last 30 days. She is not currently on abx, no Urological surgeries in the last 30days.

## 2015-12-10 ENCOUNTER — Encounter: Payer: Self-pay | Admitting: Emergency Medicine

## 2015-12-10 ENCOUNTER — Inpatient Hospital Stay
Admission: EM | Admit: 2015-12-10 | Discharge: 2015-12-13 | DRG: 872 | Disposition: A | Discharge status: Skilled Nursing Facility | Payer: Medicare Other | Attending: Internal Medicine | Admitting: Internal Medicine

## 2015-12-10 DIAGNOSIS — D649 Anemia, unspecified: Secondary | ICD-10-CM | POA: Diagnosis present

## 2015-12-10 DIAGNOSIS — N183 Chronic kidney disease, stage 3 (moderate): Secondary | ICD-10-CM | POA: Diagnosis present

## 2015-12-10 DIAGNOSIS — M1990 Unspecified osteoarthritis, unspecified site: Secondary | ICD-10-CM | POA: Diagnosis present

## 2015-12-10 DIAGNOSIS — N12 Tubulo-interstitial nephritis, not specified as acute or chronic: Secondary | ICD-10-CM | POA: Diagnosis present

## 2015-12-10 DIAGNOSIS — E871 Hypo-osmolality and hyponatremia: Secondary | ICD-10-CM | POA: Diagnosis present

## 2015-12-10 DIAGNOSIS — Z9842 Cataract extraction status, left eye: Secondary | ICD-10-CM | POA: Diagnosis not present

## 2015-12-10 DIAGNOSIS — H409 Unspecified glaucoma: Secondary | ICD-10-CM | POA: Diagnosis present

## 2015-12-10 DIAGNOSIS — I509 Heart failure, unspecified: Secondary | ICD-10-CM | POA: Diagnosis present

## 2015-12-10 DIAGNOSIS — Z79899 Other long term (current) drug therapy: Secondary | ICD-10-CM | POA: Diagnosis not present

## 2015-12-10 DIAGNOSIS — Z9889 Other specified postprocedural states: Secondary | ICD-10-CM

## 2015-12-10 DIAGNOSIS — Z888 Allergy status to other drugs, medicaments and biological substances status: Secondary | ICD-10-CM

## 2015-12-10 DIAGNOSIS — Z961 Presence of intraocular lens: Secondary | ICD-10-CM | POA: Diagnosis present

## 2015-12-10 DIAGNOSIS — E860 Dehydration: Secondary | ICD-10-CM | POA: Diagnosis present

## 2015-12-10 DIAGNOSIS — N179 Acute kidney failure, unspecified: Secondary | ICD-10-CM | POA: Diagnosis present

## 2015-12-10 DIAGNOSIS — N10 Acute tubulo-interstitial nephritis: Secondary | ICD-10-CM | POA: Diagnosis present

## 2015-12-10 DIAGNOSIS — Z9841 Cataract extraction status, right eye: Secondary | ICD-10-CM

## 2015-12-10 DIAGNOSIS — I248 Other forms of acute ischemic heart disease: Secondary | ICD-10-CM | POA: Diagnosis present

## 2015-12-10 DIAGNOSIS — B9620 Unspecified Escherichia coli [E. coli] as the cause of diseases classified elsewhere: Secondary | ICD-10-CM | POA: Diagnosis present

## 2015-12-10 DIAGNOSIS — A4151 Sepsis due to Escherichia coli [E. coli]: Secondary | ICD-10-CM | POA: Diagnosis present

## 2015-12-10 DIAGNOSIS — R7989 Other specified abnormal findings of blood chemistry: Secondary | ICD-10-CM

## 2015-12-10 DIAGNOSIS — I130 Hypertensive heart and chronic kidney disease with heart failure and stage 1 through stage 4 chronic kidney disease, or unspecified chronic kidney disease: Secondary | ICD-10-CM | POA: Diagnosis present

## 2015-12-10 DIAGNOSIS — R778 Other specified abnormalities of plasma proteins: Secondary | ICD-10-CM

## 2015-12-10 DIAGNOSIS — N200 Calculus of kidney: Secondary | ICD-10-CM

## 2015-12-10 DIAGNOSIS — Z841 Family history of disorders of kidney and ureter: Secondary | ICD-10-CM | POA: Diagnosis not present

## 2015-12-10 DIAGNOSIS — Z880 Allergy status to penicillin: Secondary | ICD-10-CM

## 2015-12-10 DIAGNOSIS — E876 Hypokalemia: Secondary | ICD-10-CM | POA: Diagnosis present

## 2015-12-10 DIAGNOSIS — Z882 Allergy status to sulfonamides status: Secondary | ICD-10-CM | POA: Diagnosis not present

## 2015-12-10 DIAGNOSIS — Z96659 Presence of unspecified artificial knee joint: Secondary | ICD-10-CM | POA: Diagnosis present

## 2015-12-10 DIAGNOSIS — N289 Disorder of kidney and ureter, unspecified: Secondary | ICD-10-CM

## 2015-12-10 LAB — CBC WITH DIFFERENTIAL/PLATELET
BASOS PCT: 0 %
Basophils Absolute: 0 10*3/uL (ref 0–0.1)
Eosinophils Absolute: 0 10*3/uL (ref 0–0.7)
Eosinophils Relative: 0 %
HEMATOCRIT: 37.5 % (ref 35.0–47.0)
HEMOGLOBIN: 13 g/dL (ref 12.0–16.0)
LYMPHS ABS: 0.2 10*3/uL — AB (ref 1.0–3.6)
LYMPHS PCT: 5 %
MCH: 31.2 pg (ref 26.0–34.0)
MCHC: 34.7 g/dL (ref 32.0–36.0)
MCV: 90 fL (ref 80.0–100.0)
MONOS PCT: 2 %
Monocytes Absolute: 0.1 10*3/uL — ABNORMAL LOW (ref 0.2–0.9)
NEUTROS ABS: 4.7 10*3/uL (ref 1.4–6.5)
NEUTROS PCT: 93 %
Platelets: 213 10*3/uL (ref 150–440)
RBC: 4.17 MIL/uL (ref 3.80–5.20)
RDW: 13.5 % (ref 11.5–14.5)
WBC: 5 10*3/uL (ref 3.6–11.0)

## 2015-12-10 LAB — COMPREHENSIVE METABOLIC PANEL
ALT: 24 U/L (ref 14–54)
AST: 42 U/L — AB (ref 15–41)
Albumin: 3.8 g/dL (ref 3.5–5.0)
Alkaline Phosphatase: 90 U/L (ref 38–126)
Anion gap: 10 (ref 5–15)
BILIRUBIN TOTAL: 1.4 mg/dL — AB (ref 0.3–1.2)
BUN: 20 mg/dL (ref 6–20)
CO2: 22 mmol/L (ref 22–32)
CREATININE: 1.21 mg/dL — AB (ref 0.44–1.00)
Calcium: 8.6 mg/dL — ABNORMAL LOW (ref 8.9–10.3)
Chloride: 96 mmol/L — ABNORMAL LOW (ref 101–111)
GFR, EST AFRICAN AMERICAN: 47 mL/min — AB (ref >60)
GFR, EST NON AFRICAN AMERICAN: 40 mL/min — AB (ref >60)
Glucose, Bld: 132 mg/dL — ABNORMAL HIGH (ref 65–99)
Potassium: 3.2 mmol/L — ABNORMAL LOW (ref 3.5–5.1)
Sodium: 128 mmol/L — ABNORMAL LOW (ref 135–145)
TOTAL PROTEIN: 6.9 g/dL (ref 6.5–8.1)

## 2015-12-10 LAB — URINALYSIS COMPLETE WITH MICROSCOPIC (ARMC ONLY)
Specific Gravity, Urine: 1.022 (ref 1.005–1.030)
Squamous Epithelial / LPF: NONE SEEN

## 2015-12-10 LAB — TROPONIN I
TROPONIN I: 0.12 ng/mL — AB (ref <0.03)
Troponin I: 0.06 ng/mL (ref <0.03)

## 2015-12-10 LAB — GLUCOSE, CAPILLARY: Glucose-Capillary: 133 mg/dL — ABNORMAL HIGH (ref 65–99)

## 2015-12-10 LAB — MAGNESIUM: MAGNESIUM: 1.5 mg/dL — AB (ref 1.7–2.4)

## 2015-12-10 LAB — LACTIC ACID, PLASMA
LACTIC ACID, VENOUS: 2.1 mmol/L — AB (ref 0.5–1.9)
Lactic Acid, Venous: 1.3 mmol/L (ref 0.5–1.9)

## 2015-12-10 LAB — PHOSPHORUS: Phosphorus: 2.1 mg/dL — ABNORMAL LOW (ref 2.5–4.6)

## 2015-12-10 MED ORDER — ASPIRIN EC 325 MG PO TBEC
325.0000 mg | DELAYED_RELEASE_TABLET | Freq: Once | ORAL | Status: DC
  Filled 2015-12-10: qty 1

## 2015-12-10 MED ORDER — SENNOSIDES-DOCUSATE SODIUM 8.6-50 MG PO TABS: 1.0000 | ORAL_TABLET | Freq: Every evening | ORAL | Status: DC | PRN

## 2015-12-10 MED ORDER — ONDANSETRON HCL 4 MG PO TABS
4.0000 mg | ORAL_TABLET | Freq: Four times a day (QID) | ORAL | Status: DC | PRN
  Administered 2015-12-11: 4 mg via ORAL
  Filled 2015-12-10: qty 1

## 2015-12-10 MED ORDER — LEVOFLOXACIN IN D5W 500 MG/100ML IV SOLN: 500.0000 mg | Freq: Once | INTRAVENOUS | Status: DC

## 2015-12-10 MED ORDER — NITROGLYCERIN 0.4 MG SL SUBL: 0.4000 mg | SUBLINGUAL_TABLET | SUBLINGUAL | Status: DC | PRN

## 2015-12-10 MED ORDER — LEVOFLOXACIN IN D5W 250 MG/50ML IV SOLN
250.0000 mg | Freq: Once | INTRAVENOUS | Status: AC
  Administered 2015-12-10: 250 mg via INTRAVENOUS
  Filled 2015-12-10: qty 50

## 2015-12-10 MED ORDER — POTASSIUM CHLORIDE CRYS ER 20 MEQ PO TBCR
20.0000 meq | EXTENDED_RELEASE_TABLET | Freq: Two times a day (BID) | ORAL | Status: DC
  Administered 2015-12-11: 20 meq via ORAL
  Filled 2015-12-10: qty 1

## 2015-12-10 MED ORDER — ACETAMINOPHEN 325 MG PO TABS
650.0000 mg | ORAL_TABLET | Freq: Four times a day (QID) | ORAL | Status: DC | PRN
Start: 2015-12-10 — End: 2015-12-13
  Administered 2015-12-11 – 2015-12-13 (×3): 650 mg via ORAL
  Filled 2015-12-10 (×3): qty 2

## 2015-12-10 MED ORDER — ACETAMINOPHEN 650 MG RE SUPP: 650.0000 mg | Freq: Four times a day (QID) | RECTAL | Status: DC | PRN

## 2015-12-10 MED ORDER — MAGNESIUM CITRATE PO SOLN
1.0000 | Freq: Once | ORAL | Status: DC | PRN
Start: 2015-12-10 — End: 2015-12-13

## 2015-12-10 MED ORDER — ENOXAPARIN SODIUM 30 MG/0.3ML ~~LOC~~ SOLN
30.0000 mg | Freq: Every day | SUBCUTANEOUS | Status: DC
  Administered 2015-12-11: 30 mg via SUBCUTANEOUS
  Filled 2015-12-10: qty 0.3

## 2015-12-10 MED ORDER — ACETAMINOPHEN 500 MG PO TABS: 500.0000 mg | ORAL_TABLET | Freq: Two times a day (BID) | ORAL | Status: DC

## 2015-12-10 MED ORDER — ONDANSETRON HCL 4 MG/2ML IJ SOLN
INTRAMUSCULAR | Status: AC
  Administered 2015-12-10: 4 mg via INTRAVENOUS
  Filled 2015-12-10: qty 2

## 2015-12-10 MED ORDER — ZOLPIDEM TARTRATE 5 MG PO TABS: 5.0000 mg | ORAL_TABLET | Freq: Every evening | ORAL | Status: DC | PRN

## 2015-12-10 MED ORDER — ONDANSETRON HCL 4 MG/2ML IJ SOLN
4.0000 mg | Freq: Four times a day (QID) | INTRAMUSCULAR | Status: DC | PRN
  Administered 2015-12-11: 4 mg via INTRAVENOUS
  Filled 2015-12-10: qty 2

## 2015-12-10 MED ORDER — SODIUM CHLORIDE 0.9% FLUSH
3.0000 mL | Freq: Two times a day (BID) | INTRAVENOUS | Status: DC
  Administered 2015-12-11 – 2015-12-13 (×5): 3 mL via INTRAVENOUS

## 2015-12-10 MED ORDER — METOPROLOL SUCCINATE ER 25 MG PO TB24
12.5000 mg | ORAL_TABLET | Freq: Every day | ORAL | Status: DC
  Administered 2015-12-11 – 2015-12-13 (×3): 12.5 mg via ORAL
  Filled 2015-12-10 (×3): qty 1

## 2015-12-10 MED ORDER — ACETAMINOPHEN 500 MG PO TABS
500.0000 mg | ORAL_TABLET | Freq: Every day | ORAL | Status: DC
  Administered 2015-12-11 – 2015-12-12 (×3): 500 mg via ORAL
  Filled 2015-12-10 (×3): qty 1

## 2015-12-10 MED ORDER — HYDROCODONE-ACETAMINOPHEN 5-325 MG PO TABS
1.0000 | ORAL_TABLET | ORAL | Status: DC | PRN
Start: 2015-12-10 — End: 2015-12-13

## 2015-12-10 MED ORDER — SODIUM CHLORIDE 0.9 % IV SOLN
INTRAVENOUS | Status: DC
  Administered 2015-12-10: via INTRAVENOUS

## 2015-12-10 MED ORDER — SODIUM CHLORIDE 0.9 % IV BOLUS (SEPSIS)
500.0000 mL | Freq: Once | INTRAVENOUS | Status: AC
  Administered 2015-12-10: 500 mL via INTRAVENOUS

## 2015-12-10 MED ORDER — ACETAMINOPHEN 500 MG PO TABS
1000.0000 mg | ORAL_TABLET | Freq: Every day | ORAL | Status: DC
  Administered 2015-12-11 – 2015-12-13 (×3): 1000 mg via ORAL
  Filled 2015-12-10 (×3): qty 2

## 2015-12-10 MED ORDER — BACID PO TABS
2.0000 | ORAL_TABLET | Freq: Three times a day (TID) | ORAL | Status: DC
  Administered 2015-12-11 (×2): 2 via ORAL
  Filled 2015-12-10 (×5): qty 2

## 2015-12-10 MED ORDER — ONDANSETRON HCL 4 MG/2ML IJ SOLN
4.0000 mg | Freq: Once | INTRAMUSCULAR | Status: AC
  Administered 2015-12-10: 4 mg via INTRAVENOUS

## 2015-12-10 MED ORDER — LEVOFLOXACIN 500 MG PO TABS
250.0000 mg | ORAL_TABLET | Freq: Every day | ORAL | Status: DC
  Administered 2015-12-11 – 2015-12-13 (×3): 250 mg via ORAL
  Filled 2015-12-10 (×3): qty 1

## 2015-12-10 MED ORDER — TIMOLOL MALEATE 0.5 % OP SOLN
1.0000 [drp] | Freq: Every day | OPHTHALMIC | Status: DC
  Administered 2015-12-11 – 2015-12-13 (×3): 1 [drp] via OPHTHALMIC
  Filled 2015-12-10: qty 5

## 2015-12-10 MED ORDER — BISACODYL 5 MG PO TBEC: 5.0000 mg | DELAYED_RELEASE_TABLET | Freq: Every day | ORAL | Status: DC | PRN

## 2015-12-10 MED ORDER — PROMETHAZINE HCL 25 MG/ML IJ SOLN
6.2500 mg | Freq: Once | INTRAMUSCULAR | Status: AC
  Administered 2015-12-10: 6.25 mg via INTRAVENOUS
  Filled 2015-12-10: qty 1

## 2015-12-10 MED ORDER — LATANOPROST 0.005 % OP SOLN
1.0000 [drp] | Freq: Every day | OPHTHALMIC | Status: DC
  Administered 2015-12-11 – 2015-12-12 (×2): 1 [drp] via OPHTHALMIC
  Filled 2015-12-10: qty 2.5

## 2015-12-10 NOTE — ED Provider Notes (Addendum)
[facility] Emergency Department Provider Note  ____________________________________________   I have reviewed the triage vital signs and the nursing notes.   HISTORY  Chief Complaint Abdominal Pain    HPI Maria [name] is a 82 y.o. female who presents today complaining of urinary tract infection. Patient states she's been having dysuria or urinary frequency and feeling generally poorly since Wednesday. She saw her urologist who did a urinalysis. She states that she felt that they were thinking that she had a urinary tract infection because of her multiple allergies and intolerances of medications they deferred giving antibiotics until the culture came back. Culture will not be back to early this week however today she began to be diaphoretic having increased lower pelvic discomfort increased dysuria, a little bit of flank pain and vomiting. She feels that her urinary tract infection is getting worse and for that reason she has come in. She denies any diarrhea. She states she does have fever and chills at home. She denies any chest pain or shortness of breath or cough.    Past Medical History:  Diagnosis Date  . Arthritis    fingers, knees  . CHF (congestive heart failure) ([place])   . Complication of anesthesia    Pt reports "coded" during spinal injection during back surgery - 8 yrs ago - [place], [place]  . Environmental and seasonal allergies   . Hypercholesteremia   . Hypertension   . Left arm weakness    positional  . Left bundle branch block (LBBB)   . Motion sickness    seasick  . Seizures ([place]) 06/2013   reaction to exposure to allergan (dogs and cats)  . Vasovagal episode     There are no active problems to display for this patient.   Past Surgical History:  Procedure Laterality Date  . BACK SURGERY     multiple  . CATARACT EXTRACTION W/ INTRAOCULAR LENS  IMPLANT, BILATERAL    . JOINT REPLACEMENT     knee  . MASS EXCISION Left 12/15/2014    Procedure: MINOR EXCISION OF MASS LEFT INCLUSION CYST;  Surgeon: Beverly Gust, MD;  Location: [facility];  Service: ENT;  Laterality: Left;  NEEDS 2 NURSE  . ROTATOR CUFF REPAIR Bilateral     Prior to Admission medications   Medication Sig Start Date End Date Taking? Authorizing Provider  acetaminophen (TYLENOL) 500 MG tablet Take 500-1,000 mg by mouth 2 (two) times daily. 1000 mg every morning and 500 mg at bedtime    Historical Provider, MD  clindamycin (CLEOCIN) 300 MG capsule Take 600 mg by mouth once. 60 minutes prior to dental appointment    Historical Provider, MD  latanoprost (XALATAN) 0.005 % ophthalmic solution Place 1 drop into both eyes at bedtime.     Historical Provider, MD  levofloxacin (LEVAQUIN) 750 MG tablet Take 750 mg by mouth daily. For 7 days 10/08/15   Historical Provider, MD  metroNIDAZOLE (FLAGYL) 500 MG tablet Take 500 mg by mouth 3 (three) times daily. For 7 days 10/11/15   Historical Provider, MD  Probiotic Product (PROBIOTIC-10 PO) Take by mouth.    Historical Provider, MD  ramipril (ALTACE) 10 MG capsule Take 10 mg by mouth daily.    Historical Provider, MD  sulfamethoxazole-trimethoprim (BACTRIM DS,SEPTRA DS) 800-160 MG tablet Take 1 tablet by mouth 2 (two) times daily. For 7 days 10/11/15   Historical Provider, MD  timolol (TIMOPTIC) 0.5 % ophthalmic solution Place 1 drop into both eyes every morning.  Historical Provider, MD    Allergies Penicillin g; Sulfa antibiotics; Sulfamethoxazole-trimethoprim; Articaine-epinephrine; Darvon [propoxyphene]; Erythromycin; Guaifenesin; Levofloxacin; Metronidazole; Other; Penicillins; Statins; Tetracycline; Erythromycin base; Macrobid [nitrofurantoin]; Spinach; Tape; and Tetracyclines & related  Family History  Problem Relation Age of Onset  . Kidney Stones Mother   . Kidney disease Neg Hx     Social History Social History  Substance Use Topics  . Smoking status: Never Smoker  . Smokeless tobacco: Not on  file  . Alcohol use No    Review of Systems Constitutional: Positive fever/chills Eyes: No visual changes. ENT: No sore throat. No stiff neck no neck pain Cardiovascular: Denies chest pain. Respiratory: Denies shortness of breath. Gastrointestinal:   Positive vomiting.  No diarrhea.  No constipation. Genitourinary: Positive for dysuria. Musculoskeletal: Negative lower extremity swelling Skin: Negative for rash. Neurological: Negative for severe headaches, focal weakness or numbness. 10-point ROS otherwise negative.  ____________________________________________   PHYSICAL EXAM:  VITAL SIGNS: ED Triage Vitals [12/10/15 1959]  Enc Vitals Group     BP 121/64     Pulse Rate (!) 103     Resp 17     Temp 98.9 F (37.2 C)     Temp Source Oral     SpO2 95 %     Weight 126 lb (57.2 kg)     Height 5' 2.5" (1.588 m)     Head Circumference      Peak Flow      Pain Score      Pain Loc      Pain Edu?      Excl. in [place]?     Constitutional: Alert and oriented. Well appearing and in no acute distress. Eyes: Conjunctivae are normal. PERRL. EOMI. Head: Atraumatic. Nose: No congestion/rhinnorhea. Mouth/Throat: Mucous membranes are moist.  Oropharynx non-erythematous. Neck: No stridor.   Nontender with no meningismus Cardiovascular: Normal rate, regular rhythm. Grossly normal heart sounds.  Good peripheral circulation. Respiratory: Normal respiratory effort.  No retractions. Lungs CTAB. Abdominal: Soft and Slight Suprapubic tenderness noted. No distention. No guarding no rebound Back:  There is no focal tenderness or step off.  there is no midline tenderness there are no lesions noted. there is Normal left CVA tenderness Musculoskeletal: No lower extremity tenderness, no upper extremity tenderness. No joint effusions, no DVT signs strong distal pulses no edema Neurologic:  Normal speech and language. No gross focal neurologic deficits are appreciated.  Skin:  Skin is warm, dry and  intact. No rash noted. Psychiatric: Mood and affect are normal. Speech and behavior are normal.  ____________________________________________   LABS (all labs ordered are listed, but only abnormal results are displayed)  Labs Reviewed  URINE CULTURE  CULTURE, BLOOD (ROUTINE X 2)  CULTURE, BLOOD (ROUTINE X 2)  COMPREHENSIVE METABOLIC PANEL  URINALYSIS COMPLETEWITH MICROSCOPIC ([facility] ONLY)  CBC WITH DIFFERENTIAL/PLATELET  TROPONIN I  LACTIC ACID, PLASMA  LACTIC ACID, PLASMA   ____________________________________________  EKG  I personally interpreted any EKGs ordered by me or triage Old left bundle bunch block noted sinus tachycardia rate 102 ____________________________________________  RADIOLOGY  I reviewed any imaging ordered by me or triage that were performed during my shift and, if possible, patient and/or family made aware of any abnormal findings. ____________________________________________   PROCEDURES  Procedure(s) performed: None  Procedures  Critical Care performed: None  ____________________________________________   INITIAL IMPRESSION / ASSESSMENT AND PLAN / ED COURSE  Pertinent labs & imaging results that were available during my care of the patient were reviewed by me  and considered in my medical decision making (see chart for details).  Patient presents to the complaining of urinary tract infection which is now progressed to fever and slight flank pain. Also vomiting. Given her multiple medical intolerances, her urologist was reluctant appropriately to start her on antibiotics but unfortunately she is getting more ill. She will require antibiotics given the symptoms depending on what we find that her blood work. She apparently was quite diaphoretic on the way in. For this reason we are doing a troponin and EKG although she does not have chest pain and what to ensure that were not missing something in this 82 year old woman. We are sending urinalysis  urine culture blood culture and we will likely have to empirically treat. Patient has had Levaquin with some confusion at high doses before were tolerated lower doses in the past. She had no anaphylaxis that medication.   ----------------------------------------- 8:13 PM on 12/10/2015 -----------------------------------------    Her preliminary report for her urinary culture shows not show any sensitivities but does show greater than 100,000 CFU use of Escherichia coli from the 14th. This is noted out the source. We will give her antibiotics pending culture sensitivities. There are no other urine cultures that given his guidance at this time. Patient has noted is not allergic to fluoroquinolones and we will start her on a low dose of Levaquin   ----------------------------------------- 8:36 PM on 12/10/2015 -----------------------------------------  Patient obviously has some issues with medication some of which seem to be significant others of which seemed to be less so. She is refusing 500 of Levaquin will accept 250 mg. We will start with that. I prefer to give her more hopefully this will be enough to cover her Escherichia coli. Her white count is not elevated she is afebrile here. This lessens my initial concern for possible bacteremia. Patient does have a borderline troponin here this is likely from strain. She has no chest pain or shortness of breath.  Clinical Course   ____________________________________________   FINAL CLINICAL IMPRESSION(S) / ED DIAGNOSES  Final diagnoses:  None      This chart was dictated using voice recognition software.  Despite best efforts to proofread,  errors can occur which can change meaning.      Schuyler Amor, MD 12/10/15 2014    Schuyler Amor, MD 12/10/15 2037    Schuyler Amor, MD 12/10/15 2107

## 2015-12-10 NOTE — H&P (Signed)
[place] @ [facility] Admission History and Physical Harvie Bridge, D.O.  ---------------------------------------------------------------------------------------------------------------------   PATIENT NAME: Maria [name] MR#: [id number] DATE OF BIRTH: 1931-03-20 DATE OF ADMISSION: 12/10/2015 PRIMARY CARE PHYSICIAN: Kirk Ruths., MD  REQUESTING/REFERRING PHYSICIAN: ED Dr. Burlene Arnt  CHIEF COMPLAINT: Chief Complaint  Patient presents with  . Abdominal Pain    HISTORY OF PRESENT ILLNESS: Please note the majority of the history is obtained from the patient's chart in the emergency department physician as the patient does not volunteer much information.  Maria [name] is a 82 y.o. female with a known history of hypertension, hyperlipidemia, CHF, seizures was in a usual state of health until 3 days ago when she reports the onset of urinary frequency associated with dysuria. Patient saw her urologist and was awaiting urine culture results prior to initiating antibiotics given her history of multiple drug allergies. In the meantime her symptoms worsen to include lower abdominal pain, bilateral flank pain, nausea, generalized weakness, fatigue, mild diaphoresis. She denies any chest pain, shortness of breath, palpitations, dizziness or lightheadedness, fevers, chills, vomiting. (Of note per emergency department notes she reported fevers chills, vomiting.)  Otherwise there has been no change in status. Patient has been taking medication as prescribed and there has been no recent change in medication or diet.  There has been no recent illness, travel or sick contacts.      PAST MEDICAL HISTORY: Past Medical History:  Diagnosis Date  . Arthritis    fingers, knees  . CHF (congestive heart failure) ([place])   . Complication of anesthesia    Pt reports "coded" during spinal injection during back surgery - 8 yrs ago - [place], [place]  . Environmental and seasonal allergies   .  Hypercholesteremia   . Hypertension   . Left arm weakness    positional  . Left bundle branch block (LBBB)   . Motion sickness    seasick  . Seizures ([place]) 06/2013   reaction to exposure to allergan (dogs and cats)  . Vasovagal episode       PAST SURGICAL HISTORY: Past Surgical History:  Procedure Laterality Date  . BACK SURGERY     multiple  . CATARACT EXTRACTION W/ INTRAOCULAR LENS  IMPLANT, BILATERAL    . JOINT REPLACEMENT     knee  . MASS EXCISION Left 12/15/2014   Procedure: MINOR EXCISION OF MASS LEFT INCLUSION CYST;  Surgeon: Beverly Gust, MD;  Location: [facility];  Service: ENT;  Laterality: Left;  NEEDS 2 NURSE  . ROTATOR CUFF REPAIR Bilateral       SOCIAL HISTORY: Social History  Substance Use Topics  . Smoking status: Never Smoker  . Smokeless tobacco: Never Used  . Alcohol use No      FAMILY HISTORY: Family History  Problem Relation Age of Onset  . Kidney Stones Mother   . Kidney disease Neg Hx      MEDICATIONS AT HOME: Prior to Admission medications   Medication Sig Start Date End Date Taking? Authorizing Provider  acetaminophen (TYLENOL) 500 MG tablet Take 500-1,000 mg by mouth 2 (two) times daily. 1000 mg every morning and 500 mg at bedtime   Yes Historical Provider, MD  latanoprost (XALATAN) 0.005 % ophthalmic solution Place 1 drop into both eyes at bedtime.    Yes Historical Provider, MD  ramipril (ALTACE) 10 MG capsule Take 10 mg by mouth daily.   Yes Historical Provider, MD  timolol (TIMOPTIC) 0.5 % ophthalmic solution Place 1 drop into both eyes  every morning.   Yes Historical Provider, MD      DRUG ALLERGIES: Allergies  Allergen Reactions  . Penicillin G Hives    AGE 82 yo PCN allergy  . Sulfa Antibiotics Other (See Comments) and Anaphylaxis  . Sulfamethoxazole-Trimethoprim Itching, Palpitations, Other (See Comments), Rash and Swelling    Shaking, redness, sneezing  . Articaine-Epinephrine Other (See Comments)  . Darvon  [Propoxyphene] Other (See Comments)    Altered mental status  . Erythromycin Other (See Comments)  . Guaifenesin Other (See Comments)    Insomnia and anxiety  . Levofloxacin Other (See Comments)    750mg  dosage after 24 hours caused confusion, off-balanced CNS  . Metronidazole Nausea Only    Gi upset  . Other Other (See Comments)    septocaine caused numbness  . Penicillins Swelling  . Statins Other (See Comments)  . Tetracycline Other (See Comments)  . Erythromycin Base Rash    GI upset  . Macrobid [Nitrofurantoin] Rash  . Spinach Palpitations    Irregular heartbeat  . Tape Rash  . Tetracyclines & Related Rash     REVIEW OF SYSTEMS: CONSTITUTIONAL: No fever/chills, Positive weakness, fatigue, diaphoresis. Negative weight gain/loss, headache EYES: No blurry or double vision. ENT: No tinnitus, postnasal drip, redness or soreness of the oropharynx. RESPIRATORY: No cough, wheeze, hemoptysis, dyspnea. CARDIOVASCULAR: No chest pain, orthopnea, palpitations, syncope. GASTROINTESTINAL: No nausea, vomiting, constipation, diarrhea, positive flank and abdominal pain, negative hematemesis, melena or hematochezia. GENITOURINARY: Positive frequency and dysuria. ( hematuria. ENDOCRINE: No polyuria or nocturia. No heat or cold intolerance. HEMATOLOGY: No anemia, bruising, bleeding. INTEGUMENTARY: No rashes, ulcers, lesions. MUSCULOSKELETAL: No arthritis, swelling, gout. NEUROLOGIC: No numbness, tingling, weakness or ataxia. No seizure-type activity. PSYCHIATRIC: No anxiety, depression, insomnia. (Of note per emergency department notes she reported fevers chills, vomiting.)  PHYSICAL EXAMINATION: VITAL SIGNS: Blood pressure (!) 98/48, pulse 87, temperature 98.1 F (36.7 C), temperature source Oral, resp. rate 18, height 5' 2.5" (1.588 m), weight 57.2 kg (126 lb), SpO2 95 %.  GENERAL: 82 y.o.-year-old pale white female patient, well-developed, well-nourished lying in the bed in no acute  distress. Patient is hard of hearing. She is fatigued but arousable. HEENT: Head atraumatic, normocephalic. Pupils equal, round, reactive to light and accommodation. No scleral icterus. Extraocular muscles intact. Nares are patent. Oropharynx is clear. Mucus membranes moist. NECK: Supple, full range of motion. No JVD, no bruit heard. No thyroid enlargement, no tenderness, no cervical lymphadenopathy. CHEST: Normal breath sounds bilaterally. No wheezing, rales, rhonchi or crackles. No use of accessory muscles of respiration.  No reproducible chest wall tenderness.  CARDIOVASCULAR: S1, S2 normal. No murmurs, rubs, or gallops. Cap refill <2 seconds. ABDOMEN: Soft, nontender, nondistended. No rebound, guarding, rigidity. Normoactive bowel sounds present in all four quadrants. No organomegaly or mass. No superpubic or CVA tenderness. EXTREMITIES: Full range of motion. No pedal edema, cyanosis, or clubbing. NEUROLOGIC: Cranial nerves II through XII are grossly intact with no focal sensorimotor deficit. Muscle strength 5/5 in all extremities. Sensation intact. Gait not checked. PSYCHIATRIC: The patient is alert and oriented x 3. Normal affect, mood, thought content. SKIN: Warm, dry, and intact without obvious rash, lesion, or ulcer.  LABORATORY PANEL:  CBC  Recent Labs Lab 12/10/15 2009  WBC 5.0  HGB 13.0  HCT 37.5  PLT 213   ----------------------------------------------------------------------------------------------------------------- Chemistries  Recent Labs Lab 12/10/15 2009  NA 128*  K 3.2*  CL 96*  CO2 22  GLUCOSE 132*  BUN 20  CREATININE 1.21*  CALCIUM 8.6*  AST 42*  ALT 24  ALKPHOS 90  BILITOT 1.4*   ------------------------------------------------------------------------------------------------------------------ Cardiac Enzymes  Recent Labs Lab 12/10/15 2009  TROPONINI 0.06*    ------------------------------------------------------------------------------------------------------------------  RADIOLOGY: No results found.  EKG: Sinus tachycardia at 102 bpm with left bundle branch block, old, nonspecific ST and T wave changes.  IMPRESSION AND PLAN:  This is a 82 y.o. female with a history of  hypertension, hyperlipidemia, CHF, seizures  now being admitted with: 1. Sepsis secondary to UTI-patient is agreeable only to take by mouth Levaquin at the dose of 250 mg. I explained her the need for IV antibiotics due to her vital signs and the source of her infection being identified as a urinary tract infection. She is concerned over her multiple drug allergies and would prefer to try only oral Levaquin at this time. We will admit for IV fluid hydration, by mouth Levaquin at 250 mg per patient wishes, blood and urine culture, pain control, antiemetics. 2. AKI secondary to dehydration gentle IV fluid hydration with normal saline 2. Elevated troponin we'll order aspirin, beta blocker and nitroglycerin. Trend troponin, check lipid panel and TSH. Request cardiology consultation for consideration of further interventional workup. 3. Electrolyte derangements including hyponatremia, hypokalemia, hypomagnesemia, hypophosphatemia-we'll replace accordingly 4. History of hypertension-continue ramipril. 5. History of glaucoma-continue eye drops   Diet/Nutrition: Heart Healthy, nothing by mouth after midnight Fluids: IVNS DVT Px: Lovenox, SCDs and early ambulation Code Status: Full  All the records are reviewed and case discussed with ED provider. Management plans discussed with the patient and/or family who express understanding and agree with plan of care.   TOTAL TIME TAKING CARE OF THIS PATIENT: 60 minutes.   Jakin Pavao D.O. on 12/10/2015 at 11:11 PM Between 7am to 6pm - Pager - [phone] After 6pm go to [email or web address] - Proofreader [facility]  Hospitalists Office [phone] CC: Primary care physician; Kirk Ruths., MD     Note: This dictation was prepared with Dragon dictation along with smaller phrase technology. Any transcriptional errors that result from this process are unintentional.

## 2015-12-10 NOTE — ED Notes (Signed)
Pt assisted to wheelchair upon arrival; c/o feeling dizzy and that she may pass out; pt also c/o chills but pt diaphoretic with jacket saturated with sweat; taken to treatment room 1 for evaluation by MD

## 2015-12-10 NOTE — ED Notes (Signed)
CRITICAL LAB: TROPONIN is 0.06 and LACTIC is 2.1, [facility], Dr. Burlene Arnt notified, orders recieved

## 2015-12-10 NOTE — ED Notes (Signed)
Pt attempted to give urine sample but was unable.  

## 2015-12-10 NOTE — ED Notes (Signed)
Caryl Pina, RN called for pt arrival

## 2015-12-11 ENCOUNTER — Inpatient Hospital Stay: Payer: Medicare Other

## 2015-12-11 ENCOUNTER — Other Ambulatory Visit: Payer: Self-pay | Admitting: Urology

## 2015-12-11 ENCOUNTER — Encounter: Payer: Self-pay | Admitting: Radiology

## 2015-12-11 LAB — LIPID PANEL
Cholesterol: 139 mg/dL (ref 0–200)
HDL: 52 mg/dL (ref >40)
LDL CALC: 77 mg/dL (ref 0–99)
TRIGLYCERIDES: 48 mg/dL (ref <150)
Total CHOL/HDL Ratio: 2.7 RATIO
VLDL: 10 mg/dL (ref 0–40)

## 2015-12-11 LAB — CBC
HEMATOCRIT: 34.7 % — AB (ref 35.0–47.0)
HEMOGLOBIN: 11.7 g/dL — AB (ref 12.0–16.0)
MCH: 30.8 pg (ref 26.0–34.0)
MCHC: 33.8 g/dL (ref 32.0–36.0)
MCV: 91.3 fL (ref 80.0–100.0)
Platelets: 174 10*3/uL (ref 150–440)
RBC: 3.8 MIL/uL (ref 3.80–5.20)
RDW: 13.6 % (ref 11.5–14.5)
WBC: 13 10*3/uL — ABNORMAL HIGH (ref 3.6–11.0)

## 2015-12-11 LAB — BASIC METABOLIC PANEL
ANION GAP: 7 (ref 5–15)
BUN: 22 mg/dL — ABNORMAL HIGH (ref 6–20)
CO2: 24 mmol/L (ref 22–32)
Calcium: 8.1 mg/dL — ABNORMAL LOW (ref 8.9–10.3)
Chloride: 97 mmol/L — ABNORMAL LOW (ref 101–111)
Creatinine, Ser: 1.22 mg/dL — ABNORMAL HIGH (ref 0.44–1.00)
GFR calc Af Amer: 46 mL/min — ABNORMAL LOW (ref >60)
GFR, EST NON AFRICAN AMERICAN: 40 mL/min — AB (ref >60)
GLUCOSE: 146 mg/dL — AB (ref 65–99)
POTASSIUM: 3.9 mmol/L (ref 3.5–5.1)
Sodium: 128 mmol/L — ABNORMAL LOW (ref 135–145)

## 2015-12-11 LAB — BLOOD CULTURE ID PANEL (REFLEXED)
ACINETOBACTER BAUMANNII: NOT DETECTED
CANDIDA KRUSEI: NOT DETECTED
CANDIDA PARAPSILOSIS: NOT DETECTED
CANDIDA TROPICALIS: NOT DETECTED
CARBAPENEM RESISTANCE: NOT DETECTED
Candida albicans: NOT DETECTED
Candida glabrata: NOT DETECTED
Enterobacter cloacae complex: NOT DETECTED
Enterobacteriaceae species: DETECTED — AB
Enterococcus species: NOT DETECTED
Escherichia coli: DETECTED — AB
Haemophilus influenzae: NOT DETECTED
KLEBSIELLA OXYTOCA: NOT DETECTED
KLEBSIELLA PNEUMONIAE: NOT DETECTED
LISTERIA MONOCYTOGENES: NOT DETECTED
Methicillin resistance: NOT DETECTED
Neisseria meningitidis: NOT DETECTED
PROTEUS SPECIES: NOT DETECTED
Pseudomonas aeruginosa: NOT DETECTED
SERRATIA MARCESCENS: NOT DETECTED
STAPHYLOCOCCUS SPECIES: NOT DETECTED
Staphylococcus aureus (BCID): NOT DETECTED
Streptococcus agalactiae: NOT DETECTED
Streptococcus pneumoniae: NOT DETECTED
Streptococcus pyogenes: NOT DETECTED
Streptococcus species: NOT DETECTED
Vancomycin resistance: NOT DETECTED

## 2015-12-11 LAB — TROPONIN I
TROPONIN I: 0.1 ng/mL — AB (ref <0.03)
TROPONIN I: 0.08 ng/mL — AB (ref <0.03)
Troponin I: 0.09 ng/mL (ref <0.03)

## 2015-12-11 LAB — HEPARIN LEVEL (UNFRACTIONATED): Heparin Unfractionated: 0.42 IU/mL (ref 0.30–0.70)

## 2015-12-11 LAB — SODIUM, URINE, RANDOM: SODIUM UR: 10 mmol/L

## 2015-12-11 LAB — CULTURE, URINE COMPREHENSIVE

## 2015-12-11 LAB — APTT: aPTT: 68 seconds — ABNORMAL HIGH (ref 24–36)

## 2015-12-11 LAB — PROTIME-INR
INR: 1.28
PROTHROMBIN TIME: 16.1 s — AB (ref 11.4–15.2)

## 2015-12-11 LAB — OSMOLALITY, URINE: Osmolality, Ur: 289 mOsm/kg — ABNORMAL LOW (ref 300–900)

## 2015-12-11 MED ORDER — MAGNESIUM SULFATE IN D5W 1-5 GM/100ML-% IV SOLN
1.0000 g | Freq: Once | INTRAVENOUS | Status: AC
  Administered 2015-12-11: 1 g via INTRAVENOUS
  Filled 2015-12-11: qty 100

## 2015-12-11 MED ORDER — NITROGLYCERIN 2 % TD OINT: 0.5000 [in_us] | TOPICAL_OINTMENT | Freq: Four times a day (QID) | TRANSDERMAL | Status: DC | PRN

## 2015-12-11 MED ORDER — HEPARIN (PORCINE) IN NACL 100-0.45 UNIT/ML-% IJ SOLN
700.0000 [IU]/h | INTRAMUSCULAR | Status: DC
  Administered 2015-12-11: 700 [IU]/h via INTRAVENOUS
  Filled 2015-12-11: qty 250

## 2015-12-11 MED ORDER — CIPROFLOXACIN HCL 250 MG PO TABS: 250.0000 mg | ORAL_TABLET | Freq: Two times a day (BID) | ORAL | 0 refills | Status: DC

## 2015-12-11 MED ORDER — POTASSIUM CHLORIDE IN NACL 20-0.9 MEQ/L-% IV SOLN
INTRAVENOUS | Status: DC
  Administered 2015-12-11 – 2015-12-12 (×2): via INTRAVENOUS
  Filled 2015-12-11 (×4): qty 1000

## 2015-12-11 MED ORDER — CLOPIDOGREL BISULFATE 75 MG PO TABS
300.0000 mg | ORAL_TABLET | Freq: Once | ORAL | Status: AC
  Administered 2015-12-11: 300 mg via ORAL
  Filled 2015-12-11: qty 4

## 2015-12-11 MED ORDER — K PHOS MONO-SOD PHOS DI & MONO 155-852-130 MG PO TABS
250.0000 mg | ORAL_TABLET | Freq: Two times a day (BID) | ORAL | Status: DC
  Administered 2015-12-11 – 2015-12-13 (×6): 250 mg via ORAL
  Filled 2015-12-11 (×7): qty 1

## 2015-12-11 MED ORDER — NITROGLYCERIN 2 % TD OINT
0.5000 [in_us] | TOPICAL_OINTMENT | Freq: Four times a day (QID) | TRANSDERMAL | Status: DC
  Filled 2015-12-11: qty 1

## 2015-12-11 NOTE — Progress Notes (Signed)
PHARMACY - PHYSICIAN COMMUNICATION CRITICAL VALUE ALERT - BLOOD CULTURE IDENTIFICATION (BCID)  Results for orders placed or performed during the hospital encounter of 12/10/15  Blood Culture ID Panel (Reflexed) (Collected: 12/10/2015  8:09 PM)  Result Value Ref Range   Enterococcus species NOT DETECTED NOT DETECTED   Vancomycin resistance NOT DETECTED NOT DETECTED   Listeria monocytogenes NOT DETECTED NOT DETECTED   Staphylococcus species NOT DETECTED NOT DETECTED   Staphylococcus aureus NOT DETECTED NOT DETECTED   Methicillin resistance NOT DETECTED NOT DETECTED   Streptococcus species NOT DETECTED NOT DETECTED   Streptococcus agalactiae NOT DETECTED NOT DETECTED   Streptococcus pneumoniae NOT DETECTED NOT DETECTED   Streptococcus pyogenes NOT DETECTED NOT DETECTED   Acinetobacter baumannii NOT DETECTED NOT DETECTED   Enterobacteriaceae species DETECTED (A) NOT DETECTED   Enterobacter cloacae complex NOT DETECTED NOT DETECTED   Escherichia coli DETECTED (A) NOT DETECTED   Klebsiella oxytoca NOT DETECTED NOT DETECTED   Klebsiella pneumoniae NOT DETECTED NOT DETECTED   Proteus species NOT DETECTED NOT DETECTED   Serratia marcescens NOT DETECTED NOT DETECTED   Carbapenem resistance NOT DETECTED NOT DETECTED   Haemophilus influenzae NOT DETECTED NOT DETECTED   Neisseria meningitidis NOT DETECTED NOT DETECTED   Pseudomonas aeruginosa NOT DETECTED NOT DETECTED   Candida albicans NOT DETECTED NOT DETECTED   Candida glabrata NOT DETECTED NOT DETECTED   Candida krusei NOT DETECTED NOT DETECTED   Candida parapsilosis NOT DETECTED NOT DETECTED   Candida tropicalis NOT DETECTED NOT DETECTED    Assessment/Plan MD was contacted regarding BCID revealing Ecoli and recommended switching to meropenem 1 g per [place]. Patient is currently on day 2 of levofloxacin 250 mg qd. Pt has documented outpatient UCx from 9/14 growing Ecoli sensitive to levofloxacin. Due to patient's clinical  improvement, MD wishes to continue levofloxacin therapy.  Darrow Bussing, PharmD Pharmacy Resident 12/11/2015 9:59 AM

## 2015-12-11 NOTE — Consult Note (Signed)
[name]  CARDIOLOGY CONSULT NOTE  Patient ID: Maria [name] MRN: [id number] DOB/AGE: Nov 02, 1930 82 y.o.  Admit date: 12/10/2015 Referring Physician Ether Griffins Primary Physician [facility] Primary Cardiologist  Reason for Consultation Borderline elevated troponin  HPI: 82 year old female referred for evaluation of borderline elevated troponin. The patient presents with a 3 day history of urinary frequency and dysuria. Admission labs were notable for elevated white count and borderline elevated troponin 0.12, followed by 0.10. Urinalysis revealed numerous white cells. Renal ultrasound today revealed mild left hydronephrosis with possible left nephrolithiasis.  Review of systems complete and found to be negative unless listed above     Past Medical History:  Diagnosis Date  . Arthritis    fingers, knees  . CHF (congestive heart failure) ([place])   . Complication of anesthesia    Pt reports "coded" during spinal injection during back surgery - 8 yrs ago - [place], [place]  . Environmental and seasonal allergies   . Hypercholesteremia   . Hypertension   . Left arm weakness    positional  . Left bundle branch block (LBBB)   . Motion sickness    seasick  . Seizures ([place]) 06/2013   reaction to exposure to allergan (dogs and cats)  . Vasovagal episode     Past Surgical History:  Procedure Laterality Date  . BACK SURGERY     multiple  . CATARACT EXTRACTION W/ INTRAOCULAR LENS  IMPLANT, BILATERAL    . JOINT REPLACEMENT     knee  . MASS EXCISION Left 12/15/2014   Procedure: MINOR EXCISION OF MASS LEFT INCLUSION CYST;  Surgeon: Beverly Gust, MD;  Location: [place];  Service: ENT;  Laterality: Left;  NEEDS 2 NURSE  . ROTATOR CUFF REPAIR Bilateral     Prescriptions Prior to Admission  Medication Sig Dispense Refill Last Dose  . acetaminophen (TYLENOL) 500 MG tablet Take 500-1,000 mg by mouth 2 (two) times daily. 1000 mg every morning and 500 mg at bedtime   Taking  .  latanoprost (XALATAN) 0.005 % ophthalmic solution Place 1 drop into both eyes at bedtime.    Taking  . ramipril (ALTACE) 10 MG capsule Take 10 mg by mouth daily.   Taking  . timolol (TIMOPTIC) 0.5 % ophthalmic solution Place 1 drop into both eyes every morning.   Taking   Social History   Social History  . Marital status: Married    Spouse name: N/A  . Number of children: N/A  . Years of education: N/A   Occupational History  . Not on file.   Social History Main Topics  . Smoking status: Never Smoker  . Smokeless tobacco: Never Used  . Alcohol use No  . Drug use: Unknown  . Sexual activity: Not on file   Other Topics Concern  . Not on file   Social History Narrative  . No narrative on file    Family History  Problem Relation Age of Onset  . Kidney Stones Mother   . Kidney disease Neg Hx       Review of systems complete and found to be negative unless listed above      PHYSICAL EXAM  General: Well developed, well nourished, in no acute distress HEENT:  Normocephalic and atramatic Neck:  No JVD.  Lungs: Clear bilaterally to auscultation and percussion. Heart: HRRR . Normal S1 and S2 without gallops or murmurs.  Abdomen: Bowel sounds are positive, abdomen soft and non-tender  Msk:  Back normal, normal gait. Normal strength and tone for  age. Extremities: No clubbing, cyanosis or edema.   Neuro: Alert and oriented X 3. Psych:  Good affect, responds appropriately  Labs:   Lab Results  Component Value Date   WBC 13.0 (H) 12/11/2015   HGB 11.7 (L) 12/11/2015   HCT 34.7 (L) 12/11/2015   MCV 91.3 12/11/2015   PLT 174 12/11/2015    Recent Labs Lab 12/10/15 2009 12/11/15 0504  NA 128* 128*  K 3.2* 3.9  CL 96* 97*  CO2 22 24  BUN 20 22*  CREATININE 1.21* 1.22*  CALCIUM 8.6* 8.1*  PROT 6.9  --   BILITOT 1.4*  --   ALKPHOS 90  --   ALT 24  --   AST 42*  --   GLUCOSE 132* 146*   Lab Results  Component Value Date   TROPONINI 0.10 (HH) 12/11/2015     Lab Results  Component Value Date   CHOL 139 12/11/2015   Lab Results  Component Value Date   HDL 52 12/11/2015   Lab Results  Component Value Date   LDLCALC 77 12/11/2015   Lab Results  Component Value Date   TRIG 48 12/11/2015   Lab Results  Component Value Date   CHOLHDL 2.7 12/11/2015   No results found for: LDLDIRECT    Radiology: US Renal  Result Date: 12/11/2015 CLINICAL DATA:  Patient with history of renal insufficiency. EXAM: RENAL / URINARY TRACT ULTRASOUND COMPLETE COMPARISON:  CT abdomen pelvis 10/11/2015 FINDINGS: Right Kidney: Length: 9.9 cm. Normal renal cortical thickness and echogenicity. No hydronephrosis. Too small to characterize hypoechoic lesion within the interpolar region of the right kidney. Left Kidney: Length: 11.2 cm. Normal renal cortical thickness and echogenicity. Mild hydronephrosis. Two possible adjacent echogenic foci within the superior pole of the left kidney, raising the possibility of nephrolithiasis. Large cyst off the inferior pole left kidney measuring approximately 6 cm. Bladder: Appears normal for degree of bladder distention. IMPRESSION: Mild left hydronephrosis. Possible left nephrolithiasis. Electronically Signed   By: Lovey Newcomer M.D.   On: 12/11/2015 08:59    EKG: Normal sinus rhythm with left bundle branch block  ASSESSMENT AND PLAN:   1. Borderline elevated troponin, in the absence of chest pain, with nondiagnostic ECG, in the setting of urosepsis, likely demand supply ischemia and not due to acute coronary syndrome  Recommendations  1. DC heparin drip 2. Defer further cardiac diagnostics at this time  Signed off for now, please call if any questions  Signed: Tsutomu Barfoot MD,PhD, [facility] 12/11/2015, 10:37 AM

## 2015-12-11 NOTE — Progress Notes (Signed)
Dr. Ara Kussmaul notified of elevated troponin 0.12. Awaiting orders.

## 2015-12-11 NOTE — Progress Notes (Signed)
ANTICOAGULATION CONSULT NOTE - Initial Consult  Pharmacy Consult for heparin Indication: chest pain/ACS  Allergies  Allergen Reactions  . Penicillin G Hives    AGE 82 yo PCN allergy  . Sulfa Antibiotics Other (See Comments) and Anaphylaxis  . Sulfamethoxazole-Trimethoprim Itching, Palpitations, Other (See Comments), Rash and Swelling    Shaking, redness, sneezing  . Articaine-Epinephrine Other (See Comments)  . Darvon [Propoxyphene] Other (See Comments)    Altered mental status  . Erythromycin Other (See Comments)  . Guaifenesin Other (See Comments)    Insomnia and anxiety  . Levofloxacin Other (See Comments)    750mg  dosage after 24 hours caused confusion, off-balanced CNS  . Metronidazole Nausea Only    Gi upset  . Other Other (See Comments)    septocaine caused numbness  . Penicillins Swelling  . Statins Other (See Comments)  . Tetracycline Other (See Comments)  . Erythromycin Base Rash    GI upset  . Macrobid [Nitrofurantoin] Rash  . Spinach Palpitations    Irregular heartbeat  . Tape Rash  . Tetracyclines & Related Rash    Patient Measurements: Height: 5' 2.5" (158.8 cm) Weight: 126 lb (57.2 kg) IBW/kg (Calculated) : 51.25 Heparin Dosing Weight: 57.2 kg  Vital Signs: Temp: 98.2 F (36.8 C) (09/16 2325) Temp Source: Oral (09/16 2325) BP: 105/73 (09/16 2325) Pulse Rate: 84 (09/16 2325)  Labs:  Recent Labs  12/10/15 2009 12/10/15 2307  HGB 13.0  --   HCT 37.5  --   PLT 213  --   CREATININE 1.21*  --   TROPONINI 0.06* 0.12*    Estimated Creatinine Clearance: 28.5 mL/min (by C-G formula based on SCr of 1.21 mg/dL (H)).   Medical History: Past Medical History:  Diagnosis Date  . Arthritis    fingers, knees  . CHF (congestive heart failure) ([place])   . Complication of anesthesia    Pt reports "coded" during spinal injection during back surgery - 8 yrs ago - [place], [place]  . Environmental and seasonal allergies   . Hypercholesteremia   .  Hypertension   . Left arm weakness    positional  . Left bundle branch block (LBBB)   . Motion sickness    seasick  . Seizures ([place]) 06/2013   reaction to exposure to allergan (dogs and cats)  . Vasovagal episode     Medications:  Infusions:  . sodium chloride 75 mL/hr at 12/10/15 2344  . heparin      Assessment: 85 yof cc abdominal pain with positive troponin. Pharmacy consulted to dose UFH for ACS. No AC according to PTA, but 30 mg enoxaparin subcutaneous x 1 for ppx given 12/11/15 at 0005 - will not bolus at this point. Goal of Therapy:  Heparin level 0.3-0.7 units/ml Monitor platelets by anticoagulation protocol: Yes   Plan:  Start heparin infusion at 700 units/hr Check anti-Xa level in 8 hours and daily while on heparin Continue to monitor H&H and platelets  Laural Benes, Pharm.D., BCPS Clinical Pharmacist 12/11/2015,1:04 AM

## 2015-12-11 NOTE — Progress Notes (Signed)
[place] [facility] [place] NAME: Maria [name]    MR#:  [id number]  DATE OF BIRTH:  Apr 01, 1930  SUBJECTIVE:  CHIEF COMPLAINT:   Chief Complaint  Patient presents with  . Abdominal Pain  The patient is a history of old Caucasian female with medical history significant for history of hypertension, hyperlipidemia, CHF, seizures, who comes to the hospital with 3 day history of urinary frequency, dysuria, lower abdominal pain, bilateral flank pain, nausea, diaphoresis, weakness. On arrival to the hospital, she was noted to be afebrile . Her labs revealed hyponatremia, renal insufficiency, elevated troponin, elevated lactic acid level, blood cultures showed Escherichia coli, urinary cultures are growing Escherichia coli from 2 days ago, almost pansensitive, patient is on levofloxacin, feels better today  Review of Systems  Constitutional: Negative for chills, fever and weight loss.  HENT: Negative for congestion.   Eyes: Negative for blurred vision and double vision.  Respiratory: Negative for cough, sputum production, shortness of breath and wheezing.   Cardiovascular: Negative for chest pain, palpitations, orthopnea, leg swelling and PND.  Gastrointestinal: Negative for abdominal pain, blood in stool, constipation, diarrhea, nausea and vomiting.  Genitourinary: Negative for dysuria, frequency, hematuria and urgency.  Musculoskeletal: Negative for falls.  Neurological: Negative for dizziness, tremors, focal weakness and headaches.  Endo/Heme/Allergies: Does not bruise/bleed easily.  Psychiatric/Behavioral: Negative for depression. The patient does not have insomnia.     VITAL SIGNS: Blood pressure (!) 106/47, pulse 67, temperature 97.8 F (36.6 C), temperature source Oral, resp. rate 18, height 5' 2.5" (1.588 m), weight 57.2 kg (126 lb), SpO2 97 %.  PHYSICAL EXAMINATION:   GENERAL:  82 y.o.-year-old patient lying in the bed with no acute distress.   EYES: Pupils equal, round, reactive to light and accommodation. No scleral icterus. Extraocular muscles intact.  HEENT: Head atraumatic, normocephalic. Oropharynx and nasopharynx clear.  NECK:  Supple, no jugular venous distention. No thyroid enlargement, no tenderness.  LUNGS: Normal breath sounds bilaterally, no wheezing, rales,rhonchi or crepitation. No use of accessory muscles of respiration.  CARDIOVASCULAR: S1, S2 normal. No murmurs, rubs, or gallops.  ABDOMEN: Soft, nontender, nondistended. Bowel sounds present. No organomegaly or mass.  EXTREMITIES: No pedal edema, cyanosis, or clubbing.  NEUROLOGIC: Cranial nerves II through XII are intact. Muscle strength 5/5 in all extremities. Sensation intact. Gait not checked.  PSYCHIATRIC: The patient is alert and oriented x 3.  SKIN: No obvious rash, lesion, or ulcer.   ORDERS/RESULTS REVIEWED:   CBC  Recent Labs Lab 12/10/15 2009 12/11/15 0504  WBC 5.0 13.0*  HGB 13.0 11.7*  HCT 37.5 34.7*  PLT 213 174  MCV 90.0 91.3  MCH 31.2 30.8  MCHC 34.7 33.8  RDW 13.5 13.6  LYMPHSABS 0.2*  --   MONOABS 0.1*  --   EOSABS 0.0  --   BASOSABS 0.0  --    ------------------------------------------------------------------------------------------------------------------  Chemistries   Recent Labs Lab 12/10/15 2009 12/10/15 2307 12/11/15 0504  NA 128*  --  128*  K 3.2*  --  3.9  CL 96*  --  97*  CO2 22  --  24  GLUCOSE 132*  --  146*  BUN 20  --  22*  CREATININE 1.21*  --  1.22*  CALCIUM 8.6*  --  8.1*  MG  --  1.5*  --   AST 42*  --   --   ALT 24  --   --   ALKPHOS 90  --   --  BILITOT 1.4*  --   --    ------------------------------------------------------------------------------------------------------------------ estimated creatinine clearance is 28.3 mL/min (by C-G formula based on SCr of 1.22 mg/dL (H)). ------------------------------------------------------------------------------------------------------------------ No  results for input(s): TSH, T4TOTAL, T3FREE, THYROIDAB in the last 72 hours.  Invalid input(s): FREET3  Cardiac Enzymes  Recent Labs Lab 12/10/15 2307 12/11/15 0504 12/11/15 1006  TROPONINI 0.12* 0.10* 0.08*   ------------------------------------------------------------------------------------------------------------------ Invalid input(s): POCBNP ---------------------------------------------------------------------------------------------------------------  RADIOLOGY: Ct Abdomen Pelvis Wo Contrast  Result Date: 12/11/2015 CLINICAL DATA:  LEFT side abdominal pain.  History diverticulitis. EXAM: CT ABDOMEN AND PELVIS WITHOUT CONTRAST TECHNIQUE: Multidetector CT imaging of the abdomen and pelvis was performed following the standard protocol without IV contrast. COMPARISON:  CT 07/18/ 2017 FINDINGS: Lower chest: Lung bases are clear. Hepatobiliary: No focal hepatic lesion. No biliary duct dilatation. Gallbladder is normal. Common bile duct is normal. Pancreas: Pancreas is normal. No ductal dilatation. No pancreatic inflammation. Spleen: Normal spleen Adrenals/urinary tract: Adrenal glands normal. There is new perinephric stranding on the LEFT compared to prior exam. Low-attenuation cortical lesions are unchanged. No clear evidence of calculus in the course of the LEFT ureter. Multiple vascular calcifications are noted along the course of the ureter. No bladder calculi. No RIGHT nephrolithiasis or ureterolithiasis. Bladder mildly thick-walled at 7 mm. Stomach/Bowel: Stomach, duodenum, small-bowel, appendix and cecum normal. The diverticula of the sigmoid colon descending colon without acute inflammation. Rectum normal. Vascular/Lymphatic: Abdominal aorta is normal caliber with atherosclerotic calcification. There is no retroperitoneal or periportal lymphadenopathy. No pelvic lymphadenopathy. Small retroperitoneal lymph nodes similar comparison exam. Exam Reproductive: Uterus and adnexa normal Other: No  free fluid. Musculoskeletal: No aggressive osseous lesion. IMPRESSION: 1. Interval perinephric stranding within the LEFT pararenal space. Recommend correlation with pyelonephritis / urinary tract infection. No obstructing calculus is identified. 2. Mildly thick-walled bladder is nonspecific. 3. Diverticulosis without evidence acute diverticulitis. Electronically Signed   By: Suzy Bouchard M.D.   On: 12/11/2015 11:45   US Renal  Result Date: 12/11/2015 CLINICAL DATA:  Patient with history of renal insufficiency. EXAM: RENAL / URINARY TRACT ULTRASOUND COMPLETE COMPARISON:  CT abdomen pelvis 10/11/2015 FINDINGS: Right Kidney: Length: 9.9 cm. Normal renal cortical thickness and echogenicity. No hydronephrosis. Too small to characterize hypoechoic lesion within the interpolar region of the right kidney. Left Kidney: Length: 11.2 cm. Normal renal cortical thickness and echogenicity. Mild hydronephrosis. Two possible adjacent echogenic foci within the superior pole of the left kidney, raising the possibility of nephrolithiasis. Large cyst off the inferior pole left kidney measuring approximately 6 cm. Bladder: Appears normal for degree of bladder distention. IMPRESSION: Mild left hydronephrosis. Possible left nephrolithiasis. Electronically Signed   By: Lovey Newcomer M.D.   On: 12/11/2015 08:59    EKG:  Orders placed or performed during the hospital encounter of 12/10/15  . ED EKG  . ED EKG  . EKG 12-Lead  . EKG 12-Lead    ASSESSMENT AND PLAN:  Active Problems:   AKI (acute kidney injury) ([place])  #1. Escherichia coli sepsis, continue patient on levofloxacin, clinically improving, sensitivities are pending, just antibiotics depending on the culture results. #2 Acute pyelonephritis, Escherichia coli, urine cultures from 12/08/2015, resistant to cephalothin, piperacillin, ampicillin, continue levofloxacin orally. Renal ultrasound revealed mild left hydronephrosis, possible left nephrolithiasis. CT of  abdomen and pelvis to rule out stones revealed perinephric stranding in left perirenal space, thick-walled bladder, no stones.   #3. Renal insufficiency, likely chronic, stable #4. Hyponatremia, get urinary osmolarity, urine sodium, continue IV fluids, follow sodium level in the morning #5.  Elevated troponin, likely demand ischemia, patient was seen by cardiologist, who felt that the heparin drip should be discontinued, no further cardiac diagnostics was recommended #6. Leukocytosis, follow with antibiotic therapy #7. Anemia, get Hemoccult Management plans discussed with the patient, family and they are in agreement.   DRUG ALLERGIES:  Allergies  Allergen Reactions  . Penicillin G Hives    AGE 49 yo PCN allergy  . Sulfa Antibiotics Other (See Comments) and Anaphylaxis  . Sulfamethoxazole-Trimethoprim Itching, Palpitations, Other (See Comments), Rash and Swelling    Shaking, redness, sneezing  . Articaine-Epinephrine Other (See Comments)  . Darvon [Propoxyphene] Other (See Comments)    Altered mental status  . Erythromycin Other (See Comments)  . Guaifenesin Other (See Comments)    Insomnia and anxiety  . Levofloxacin Other (See Comments)    750mg  dosage after 24 hours caused confusion, off-balanced CNS  . Metronidazole Nausea Only    Gi upset  . Other Other (See Comments)    septocaine caused numbness  . Penicillins Swelling  . Statins Other (See Comments)  . Tetracycline Other (See Comments)  . Erythromycin Base Rash    GI upset  . Macrobid [Nitrofurantoin] Rash  . Spinach Palpitations    Irregular heartbeat  . Tape Rash  . Tetracyclines & Related Rash    CODE STATUS:     Code Status Orders        Start     Ordered   12/10/15 2320  Full code  Continuous     12/10/15 2319    Code Status History    Date Active Date Inactive Code Status Order ID Comments User Context   This patient has a current code status but no historical code status.    Advance Directive  Documentation   Flowsheet Row Most Recent Value  Type of Advance Directive  Healthcare Power of Attorney, Living will  Pre-existing out of facility DNR order (yellow form or pink MOST form)  No data  "MOST" Form in Place?  No data      TOTAL TIME TAKING CARE OF THIS PATIENT: 40 minutes.    Theodoro Grist M.D on 12/11/2015 at 1:01 PM  Between 7am to 6pm - Pager - [phone]  After 6pm go to [email or web address] - password [email or web address] [facility]  Office  [phone]  CC: Primary care physician; Kirk Ruths., MD

## 2015-12-11 NOTE — Progress Notes (Signed)
Patient ID: Maria [name], female   DOB: 1931-01-10, 82 y.o.   MRN: [id number]   Called by nurse regarding second troponin elevated at 0.12 from 0.016. Vital signs stable and patient asymptomatic.  EKG ordered. I had already ordered aspirin, nitroglycerin and beta blocker. Will add Plavix, heparin and morphine. Patient reports allergy to statin. Cardiology consultation requested for a.m. for consideration of further interventional workup.

## 2015-12-11 NOTE — Progress Notes (Signed)
Received orders for elevated troponin 0.12. See MAR. Pt asymptomatic, VSS.

## 2015-12-12 ENCOUNTER — Telehealth: Payer: Self-pay

## 2015-12-12 LAB — BASIC METABOLIC PANEL
Anion gap: 8 (ref 5–15)
BUN: 23 mg/dL — AB (ref 6–20)
CALCIUM: 8.1 mg/dL — AB (ref 8.9–10.3)
CO2: 23 mmol/L (ref 22–32)
CREATININE: 1.03 mg/dL — AB (ref 0.44–1.00)
Chloride: 99 mmol/L — ABNORMAL LOW (ref 101–111)
GFR, EST AFRICAN AMERICAN: 57 mL/min — AB (ref >60)
GFR, EST NON AFRICAN AMERICAN: 49 mL/min — AB (ref >60)
GLUCOSE: 117 mg/dL — AB (ref 65–99)
Potassium: 4.1 mmol/L (ref 3.5–5.1)
Sodium: 130 mmol/L — ABNORMAL LOW (ref 135–145)

## 2015-12-12 LAB — CBC
HCT: 33.3 % — ABNORMAL LOW (ref 35.0–47.0)
Hemoglobin: 11.4 g/dL — ABNORMAL LOW (ref 12.0–16.0)
MCH: 31.2 pg (ref 26.0–34.0)
MCHC: 34.4 g/dL (ref 32.0–36.0)
MCV: 90.9 fL (ref 80.0–100.0)
PLATELETS: 154 10*3/uL (ref 150–440)
RBC: 3.66 MIL/uL — ABNORMAL LOW (ref 3.80–5.20)
RDW: 13.4 % (ref 11.5–14.5)
WBC: 11.6 10*3/uL — ABNORMAL HIGH (ref 3.6–11.0)

## 2015-12-12 MED ORDER — RISAQUAD PO CAPS
2.0000 | ORAL_CAPSULE | Freq: Three times a day (TID) | ORAL | Status: DC
  Filled 2015-12-12: qty 2

## 2015-12-12 MED ORDER — OXYCODONE-ACETAMINOPHEN 5-325 MG PO TABS: 1.0000 | ORAL_TABLET | Freq: Four times a day (QID) | ORAL | Status: DC | PRN

## 2015-12-12 NOTE — Care Management Important Message (Signed)
Important Message  Patient Details  Name: Maria [name] MRN: [id number] Date of Birth: Mar 07, 1931   Medicare Important Message Given:  Yes    Kasin Tonkinson A, RN 12/12/2015, 7:10 AM

## 2015-12-12 NOTE — Progress Notes (Signed)
[place] [facility] [place] NAME: Maria [name]    MR#:  [id number]  DATE OF BIRTH:  30-Nov-1930  SUBJECTIVE:  CHIEF COMPLAINT:   Chief Complaint  Patient presents with  . Abdominal Pain  The patient is a history of old Caucasian female with medical history significant for history of hypertension, hyperlipidemia, CHF, seizures, who comes to the hospital with 3 day history of urinary frequency, dysuria, lower abdominal pain, bilateral flank pain, nausea, diaphoresis, weakness. On arrival to the hospital, she was noted to be afebrile . Her labs revealed hyponatremia, renal insufficiency, elevated troponin, elevated lactic acid level, blood cultures showed Escherichia coli, urinary cultures are growing Escherichia coli from 2 days before admission, almost pansensitive, patient is on levofloxacin, feels better today. Still c/o generalized weakness. She had appointment scheduled with urology clinic for cystoscopy.  Review of Systems  Constitutional: Negative for chills, fever and weight loss.  HENT: Negative for congestion.   Eyes: Negative for blurred vision and double vision.  Respiratory: Negative for cough, sputum production, shortness of breath and wheezing.   Cardiovascular: Negative for chest pain, palpitations, orthopnea, leg swelling and PND.  Gastrointestinal: Negative for abdominal pain, blood in stool, constipation, diarrhea, nausea and vomiting.  Genitourinary: Negative for dysuria, frequency, hematuria and urgency.  Musculoskeletal: Negative for falls.  Neurological: Negative for dizziness, tremors, focal weakness and headaches.  Endo/Heme/Allergies: Does not bruise/bleed easily.  Psychiatric/Behavioral: Negative for depression. The patient does not have insomnia.     VITAL SIGNS: Blood pressure (!) 142/55, pulse 82, temperature 98.8 F (37.1 C), temperature source Axillary, resp. rate 17, height 5' 2.5" (1.588 m), weight 57.2 kg (126 lb),  SpO2 98 %.  PHYSICAL EXAMINATION:   GENERAL:  82 y.o.-year-old patient lying in the bed with no acute distress.  EYES: Pupils equal, round, reactive to light and accommodation. No scleral icterus. Extraocular muscles intact.  HEENT: Head atraumatic, normocephalic. Oropharynx and nasopharynx clear.  NECK:  Supple, no jugular venous distention. No thyroid enlargement, no tenderness.  LUNGS: Normal breath sounds bilaterally, no wheezing, rales,rhonchi or crepitation. No use of accessory muscles of respiration.  CARDIOVASCULAR: S1, S2 normal. No murmurs, rubs, or gallops.  ABDOMEN: Soft, nontender, nondistended. Bowel sounds present. No organomegaly or mass.  EXTREMITIES: No pedal edema, cyanosis, or clubbing.  NEUROLOGIC: Cranial nerves II through XII are intact. Muscle strength 5/5 in all extremities. Sensation intact. Gait not checked.  PSYCHIATRIC: The patient is alert and oriented x 3.  SKIN: No obvious rash, lesion, or ulcer.   ORDERS/RESULTS REVIEWED:   CBC  Recent Labs Lab 12/10/15 2009 12/11/15 0504 12/12/15 0512  WBC 5.0 13.0* 11.6*  HGB 13.0 11.7* 11.4*  HCT 37.5 34.7* 33.3*  PLT 213 174 154  MCV 90.0 91.3 90.9  MCH 31.2 30.8 31.2  MCHC 34.7 33.8 34.4  RDW 13.5 13.6 13.4  LYMPHSABS 0.2*  --   --   MONOABS 0.1*  --   --   EOSABS 0.0  --   --   BASOSABS 0.0  --   --    ------------------------------------------------------------------------------------------------------------------  Chemistries   Recent Labs Lab 12/10/15 2009 12/10/15 2307 12/11/15 0504 12/12/15 0512  NA 128*  --  128* 130*  K 3.2*  --  3.9 4.1  CL 96*  --  97* 99*  CO2 22  --  24 23  GLUCOSE 132*  --  146* 117*  BUN 20  --  22* 23*  CREATININE 1.21*  --  1.22* 1.03*  CALCIUM 8.6*  --  8.1* 8.1*  MG  --  1.5*  --   --   AST 42*  --   --   --   ALT 24  --   --   --   ALKPHOS 90  --   --   --   BILITOT 1.4*  --   --   --     ------------------------------------------------------------------------------------------------------------------ estimated creatinine clearance is 33.5 mL/min (by C-G formula based on SCr of 1.03 mg/dL (H)). ------------------------------------------------------------------------------------------------------------------ No results for input(s): TSH, T4TOTAL, T3FREE, THYROIDAB in the last 72 hours.  Invalid input(s): FREET3  Cardiac Enzymes  Recent Labs Lab 12/11/15 0504 12/11/15 1006 12/11/15 1631  TROPONINI 0.10* 0.08* 0.09*   ------------------------------------------------------------------------------------------------------------------ Invalid input(s): POCBNP ---------------------------------------------------------------------------------------------------------------  RADIOLOGY: Ct Abdomen Pelvis Wo Contrast  Result Date: 12/11/2015 CLINICAL DATA:  LEFT side abdominal pain.  History diverticulitis. EXAM: CT ABDOMEN AND PELVIS WITHOUT CONTRAST TECHNIQUE: Multidetector CT imaging of the abdomen and pelvis was performed following the standard protocol without IV contrast. COMPARISON:  CT 07/18/ 2017 FINDINGS: Lower chest: Lung bases are clear. Hepatobiliary: No focal hepatic lesion. No biliary duct dilatation. Gallbladder is normal. Common bile duct is normal. Pancreas: Pancreas is normal. No ductal dilatation. No pancreatic inflammation. Spleen: Normal spleen Adrenals/urinary tract: Adrenal glands normal. There is new perinephric stranding on the LEFT compared to prior exam. Low-attenuation cortical lesions are unchanged. No clear evidence of calculus in the course of the LEFT ureter. Multiple vascular calcifications are noted along the course of the ureter. No bladder calculi. No RIGHT nephrolithiasis or ureterolithiasis. Bladder mildly thick-walled at 7 mm. Stomach/Bowel: Stomach, duodenum, small-bowel, appendix and cecum normal. The diverticula of the sigmoid colon descending  colon without acute inflammation. Rectum normal. Vascular/Lymphatic: Abdominal aorta is normal caliber with atherosclerotic calcification. There is no retroperitoneal or periportal lymphadenopathy. No pelvic lymphadenopathy. Small retroperitoneal lymph nodes similar comparison exam. Exam Reproductive: Uterus and adnexa normal Other: No free fluid. Musculoskeletal: No aggressive osseous lesion. IMPRESSION: 1. Interval perinephric stranding within the LEFT pararenal space. Recommend correlation with pyelonephritis / urinary tract infection. No obstructing calculus is identified. 2. Mildly thick-walled bladder is nonspecific. 3. Diverticulosis without evidence acute diverticulitis. Electronically Signed   By: Suzy Bouchard M.D.   On: 12/11/2015 11:45   US Renal  Result Date: 12/11/2015 CLINICAL DATA:  Patient with history of renal insufficiency. EXAM: RENAL / URINARY TRACT ULTRASOUND COMPLETE COMPARISON:  CT abdomen pelvis 10/11/2015 FINDINGS: Right Kidney: Length: 9.9 cm. Normal renal cortical thickness and echogenicity. No hydronephrosis. Too small to characterize hypoechoic lesion within the interpolar region of the right kidney. Left Kidney: Length: 11.2 cm. Normal renal cortical thickness and echogenicity. Mild hydronephrosis. Two possible adjacent echogenic foci within the superior pole of the left kidney, raising the possibility of nephrolithiasis. Large cyst off the inferior pole left kidney measuring approximately 6 cm. Bladder: Appears normal for degree of bladder distention. IMPRESSION: Mild left hydronephrosis. Possible left nephrolithiasis. Electronically Signed   By: Lovey Newcomer M.D.   On: 12/11/2015 08:59    EKG:  Orders placed or performed during the hospital encounter of 12/10/15  . ED EKG  . ED EKG  . EKG 12-Lead  . EKG 12-Lead    ASSESSMENT AND PLAN:  Active Problems:   AKI (acute kidney injury) ([place])  #1. Escherichia coli sepsis, continue patient on levofloxacin, clinically  improving, sensitivities are pending from in-hospital cx, Adjust antibiotics depending on the culture results. E coli pansensitive as per cx  from clinic. #2 Acute pyelonephritis, Escherichia coli, urine cultures from 12/08/2015, resistant to cephalothin, piperacillin, ampicillin, continue levofloxacin orally. Renal ultrasound revealed mild left hydronephrosis, possible left nephrolithiasis. CT of abdomen and pelvis to rule out stones revealed perinephric stranding in left perirenal space, thick-walled bladder, no stones.    Pt have follow up appointment set up with urology clinic #3. Renal insufficiency, likely chronic, stable #4. Hyponatremia, get urinary osmolarity, urine sodium, continue IV fluids, follow sodium level in the morning #5. Elevated troponin, likely demand ischemia, patient was seen by cardiologist, who felt that the heparin drip should be discontinued, no further cardiac diagnostics was recommended #6. Leukocytosis, follow with antibiotic therapy #7. Anemia, get Hemoccult Management plans discussed with the patient, family and they are in agreement.   DRUG ALLERGIES:  Allergies  Allergen Reactions  . Penicillin G Hives    AGE 80 yo PCN allergy  . Sulfa Antibiotics Other (See Comments) and Anaphylaxis  . Sulfamethoxazole-Trimethoprim Itching, Palpitations, Other (See Comments), Rash and Swelling    Shaking, redness, sneezing  . Articaine-Epinephrine Other (See Comments)  . Darvon [Propoxyphene] Other (See Comments)    Altered mental status  . Erythromycin Other (See Comments)  . Guaifenesin Other (See Comments)    Insomnia and anxiety  . Levofloxacin Other (See Comments)    750mg  dosage after 24 hours caused confusion, off-balanced CNS  . Metronidazole Nausea Only    Gi upset  . Other Other (See Comments)    septocaine caused numbness  . Penicillins Swelling  . Statins Other (See Comments)  . Tetracycline Other (See Comments)  . Erythromycin Base Rash    GI upset   . Macrobid [Nitrofurantoin] Rash  . Spinach Palpitations    Irregular heartbeat  . Tape Rash  . Tetracyclines & Related Rash    CODE STATUS:     Code Status Orders        Start     Ordered   12/10/15 2320  Full code  Continuous     12/10/15 2319    Code Status History    Date Active Date Inactive Code Status Order ID Comments User Context   This patient has a current code status but no historical code status.    Advance Directive Documentation   Flowsheet Row Most Recent Value  Type of Advance Directive  Healthcare Power of Attorney, Living will  Pre-existing out of facility DNR order (yellow form or pink MOST form)  No data  "MOST" Form in Place?  No data      TOTAL TIME TAKING CARE OF THIS PATIENT: 40 minutes.  Pt's son was present in room.  Get PT eval to help d/c plan.  Vaughan Basta M.D on 12/12/2015 at 9:02 PM  Between 7am to 6pm - Pager - [phone]  After 6pm go to [email or web address] - password [email or web address] [facility]  Office  [phone]  CC: Primary care physician; Kirk Ruths., MD

## 2015-12-12 NOTE — Telephone Encounter (Signed)
Will call in a couple of days once pt gets out of hospital.

## 2015-12-12 NOTE — Telephone Encounter (Signed)
-----   Message from Nori Riis, PA-C sent at 12/11/2015 12:56 PM EDT ----- The prescription of Cipro to her pharmacy before realized that she had been admitted to the hospital. She is currently receiving Levaquin for which the organism is sensitive to in the hospital.  I suggest after she is discharged from the hospital, that we arrange a consultation with an allergist for allergy testing.  I also suggest that we have a trial of Intrarosa for her vaginal atrophy in an effort to reduce recurrent urinary tract infections.

## 2015-12-13 ENCOUNTER — Encounter
Admission: RE | Admit: 2015-12-13 | Discharge: 2015-12-13 | Disposition: A | Discharge status: Home / Self Care | Payer: Medicare Other | Source: Ambulatory Visit | Attending: Internal Medicine | Admitting: Internal Medicine

## 2015-12-13 LAB — CULTURE, BLOOD (ROUTINE X 2)

## 2015-12-13 LAB — GLUCOSE, CAPILLARY: Glucose-Capillary: 104 mg/dL — ABNORMAL HIGH (ref 65–99)

## 2015-12-13 LAB — URINE CULTURE

## 2015-12-13 MED ORDER — METOPROLOL SUCCINATE ER 25 MG PO TB24: 12.5000 mg | ORAL_TABLET | Freq: Every day | ORAL | 0 refills | Status: DC

## 2015-12-13 MED ORDER — BISACODYL 5 MG PO TBEC: 5.0000 mg | DELAYED_RELEASE_TABLET | Freq: Every day | ORAL | 0 refills | Status: DC | PRN

## 2015-12-13 MED ORDER — OXYCODONE-ACETAMINOPHEN 5-325 MG PO TABS: 1.0000 | ORAL_TABLET | Freq: Four times a day (QID) | ORAL | 0 refills | Status: DC | PRN

## 2015-12-13 MED ORDER — RISAQUAD PO CAPS: 2.0000 | ORAL_CAPSULE | Freq: Three times a day (TID) | ORAL | 0 refills | Status: DC

## 2015-12-13 MED ORDER — IBUPROFEN 200 MG PO TABS
200.0000 mg | ORAL_TABLET | Freq: Once | ORAL | Status: AC
  Administered 2015-12-13: 200 mg via ORAL
  Filled 2015-12-13: qty 1

## 2015-12-13 MED ORDER — LEVOFLOXACIN 250 MG PO TABS: 250.0000 mg | ORAL_TABLET | Freq: Every day | ORAL | 0 refills | Status: DC

## 2015-12-13 MED ORDER — NITROGLYCERIN 0.4 MG SL SUBL: 0.4000 mg | SUBLINGUAL_TABLET | SUBLINGUAL | 0 refills | Status: DC | PRN

## 2015-12-13 NOTE — Discharge Summary (Signed)
[facility] NAME: Maria [name]    MR#:  [id number]  DATE OF BIRTH:  [birth year]  DATE OF ADMISSION:  12/10/2015 ADMITTING PHYSICIAN: Ubaldo Glassing Hugelmeyer, DO  DATE OF DISCHARGE: No discharge date for patient encounter.  PRIMARY CARE PHYSICIAN: Kirk Ruths., MD    ADMISSION DIAGNOSIS:  Pyelonephritis [N12] Troponin I above reference range [R79.89]  DISCHARGE DIAGNOSIS:  Active Problems:   AKI (acute kidney injury) ([place])   Bacteremia    UTI  SECONDARY DIAGNOSIS:   Past Medical History:  Diagnosis Date  . Arthritis    fingers, knees  . CHF (congestive heart failure) ([place])   . Complication of anesthesia    Pt reports "coded" during spinal injection during back surgery - 8 yrs ago - [place], [place]  . Environmental and seasonal allergies   . Hypercholesteremia   . Hypertension   . Left arm weakness    positional  . Left bundle branch block (LBBB)   . Motion sickness    seasick  . Seizures ([place]) 06/2013   reaction to exposure to allergan (dogs and cats)  . Vasovagal episode     HOSPITAL COURSE:   #1. Escherichia coli bacteremia sepsis, continue patient on levofloxacin, clinically improving, sensitivities are reviewed from in-hospital cx, in both urine and blood cx- E coli are present. #2 Acute pyelonephritis, Escherichia coli, urine cultures from 12/08/2015, resistant to cephalothin, piperacillin, ampicillin, continue levofloxacin orally. Renal ultrasound revealed mild left hydronephrosis, possible left nephrolithiasis. CT of abdomen and pelvis to rule out stones revealed perinephric stranding in left perirenal space, thick-walled bladder, no stones.    Pt have follow up appointment set up with urology clinic #3. Renal insufficiency, likely chronic, stable #4. Hyponatremia, get urinary osmolarity, urine sodium, continue IV fluids, follow sodium level in the morning #5. Elevated troponin, likely demand ischemia,  patient was seen by cardiologist, who felt that the heparin drip should be discontinued, no further cardiac diagnostics was recommended #6. Leukocytosis, follow with antibiotic therapy #7. Anemia,   DISCHARGE CONDITIONS:   Stable.  CONSULTS OBTAINED:  Treatment Team:  Isaias Cowman, MD  DRUG ALLERGIES:   Allergies  Allergen Reactions  . Penicillin G Hives    AGE 82 yo PCN allergy  . Sulfa Antibiotics Other (See Comments) and Anaphylaxis  . Sulfamethoxazole-Trimethoprim Itching, Palpitations, Other (See Comments), Rash and Swelling    Shaking, redness, sneezing  . Articaine-Epinephrine Other (See Comments)  . Darvon [Propoxyphene] Other (See Comments)    Altered mental status  . Erythromycin Other (See Comments)  . Guaifenesin Other (See Comments)    Insomnia and anxiety  . Levofloxacin Other (See Comments)    750mg  dosage after 24 hours caused confusion, off-balanced CNS  . Metronidazole Nausea Only    Gi upset  . Other Other (See Comments)    septocaine caused numbness  . Penicillins Swelling  . Statins Other (See Comments)  . Tetracycline Other (See Comments)  . Erythromycin Base Rash    GI upset  . Macrobid [Nitrofurantoin] Rash  . Spinach Palpitations    Irregular heartbeat  . Tape Rash  . Tetracyclines & Related Rash    DISCHARGE MEDICATIONS:   Current Discharge Medication List    START taking these medications   Details  acidophilus (RISAQUAD) CAPS capsule Take 2 capsules by mouth 3 (three) times daily. Qty: 60 capsule, Refills: 0    bisacodyl (DULCOLAX) 5 MG EC tablet Take 1 tablet (5 mg total) by mouth daily  as needed for moderate constipation. Qty: 30 tablet, Refills: 0    levofloxacin (LEVAQUIN) 250 MG tablet Take 1 tablet (250 mg total) by mouth daily. Qty: 12 tablet, Refills: 0    metoprolol succinate (TOPROL-XL) 25 MG 24 hr tablet Take 0.5 tablets (12.5 mg total) by mouth daily. Qty: 30 tablet, Refills: 0    nitroGLYCERIN (NITROSTAT)  0.4 MG SL tablet Place 1 tablet (0.4 mg total) under the tongue every 5 (five) minutes as needed for chest pain. Qty: 30 tablet, Refills: 0    oxyCODONE-acetaminophen (PERCOCET/ROXICET) 5-325 MG tablet Take 1 tablet by mouth every 6 (six) hours as needed for severe pain. Qty: 30 tablet, Refills: 0      CONTINUE these medications which have NOT CHANGED   Details  acetaminophen (TYLENOL) 500 MG tablet Take 500-1,000 mg by mouth 2 (two) times daily. 1000 mg every morning and 500 mg at bedtime    latanoprost (XALATAN) 0.005 % ophthalmic solution Place 1 drop into both eyes at bedtime.     ramipril (ALTACE) 10 MG capsule Take 10 mg by mouth daily.    timolol (TIMOPTIC) 0.5 % ophthalmic solution Place 1 drop into both eyes every morning.      STOP taking these medications     ciprofloxacin (CIPRO) 250 MG tablet          DISCHARGE INSTRUCTIONS:    Follow with Urology clinic and GI clinic.  If you experience worsening of your admission symptoms, develop shortness of breath, life threatening emergency, suicidal or homicidal thoughts you must seek medical attention immediately by calling 911 or calling your MD immediately  if symptoms less severe.  You Must read complete instructions/literature along with all the possible adverse reactions/side effects for all the Medicines you take and that have been prescribed to you. Take any new Medicines after you have completely understood and accept all the possible adverse reactions/side effects.   Please note  You were cared for by a hospitalist during your hospital stay. If you have any questions about your discharge medications or the care you received while you were in the hospital after you are discharged, you can call the unit and asked to speak with the hospitalist on call if the hospitalist that took care of you is not available. Once you are discharged, your primary care physician will handle any further medical issues. Please note that NO  REFILLS for any discharge medications will be authorized once you are discharged, as it is imperative that you return to your primary care physician (or establish a relationship with a primary care physician if you do not have one) for your aftercare needs so that they can reassess your need for medications and monitor your lab values.    Today   CHIEF COMPLAINT:   Chief Complaint  Patient presents with  . Abdominal Pain    HISTORY OF PRESENT ILLNESS:  Maria [name]  is a 82 y.o. female with a known history of hypertension, hyperlipidemia, CHF, seizures was in a usual state of health until 3 days ago when she reports the onset of urinary frequency associated with dysuria. Patient saw her urologist and was awaiting urine culture results prior to initiating antibiotics given her history of multiple drug allergies. In the meantime her symptoms worsen to include lower abdominal pain, bilateral flank pain, nausea, generalized weakness, fatigue, mild diaphoresis. She denies any chest pain, shortness of breath, palpitations, dizziness or lightheadedness, fevers, chills, vomiting. (Of note per emergency department notes she reported fevers  chills, vomiting.)  Otherwise there has been no change in status. Patient has been taking medication as prescribed and there has been no recent change in medication or diet.  There has been no recent illness, travel or sick contacts.     VITAL SIGNS:  Blood pressure (!) 143/57, pulse 64, temperature 97.9 F (36.6 C), temperature source Oral, resp. rate 12, height 5' 2.5" (1.588 m), weight 57.2 kg (126 lb), SpO2 100 %.  I/O:   Intake/Output Summary (Last 24 hours) at 12/13/15 1524 Last data filed at 12/13/15 1355  Gross per 24 hour  Intake             1939 ml  Output             2000 ml  Net              -61 ml    PHYSICAL EXAMINATION:   GENERAL:  82 y.o.-year-old patient lying in the bed with no acute distress.  EYES: Pupils equal, round, reactive to  light and accommodation. No scleral icterus. Extraocular muscles intact.  HEENT: Head atraumatic, normocephalic. Oropharynx and nasopharynx clear.  NECK:  Supple, no jugular venous distention. No thyroid enlargement, no tenderness.  LUNGS: Normal breath sounds bilaterally, no wheezing, rales,rhonchi or crepitation. No use of accessory muscles of respiration.  CARDIOVASCULAR: S1, S2 normal. No murmurs, rubs, or gallops.  ABDOMEN: Soft, nontender, nondistended. Bowel sounds present. No organomegaly or mass.  EXTREMITIES: No pedal edema, cyanosis, or clubbing.  NEUROLOGIC: Cranial nerves II through XII are intact. Muscle strength 5/5 in all extremities. Sensation intact. Gait not checked.  PSYCHIATRIC: The patient is alert and oriented x 3.  SKIN: No obvious rash, lesion, or ulcer.   DATA REVIEW:   CBC  Recent Labs Lab 12/12/15 0512  WBC 11.6*  HGB 11.4*  HCT 33.3*  PLT 154    Chemistries   Recent Labs Lab 12/10/15 2009 12/10/15 2307  12/12/15 0512  NA 128*  --   < > 130*  K 3.2*  --   < > 4.1  CL 96*  --   < > 99*  CO2 22  --   < > 23  GLUCOSE 132*  --   < > 117*  BUN 20  --   < > 23*  CREATININE 1.21*  --   < > 1.03*  CALCIUM 8.6*  --   < > 8.1*  MG  --  1.5*  --   --   AST 42*  --   --   --   ALT 24  --   --   --   ALKPHOS 90  --   --   --   BILITOT 1.4*  --   --   --   < > = values in this interval not displayed.  Cardiac Enzymes  Recent Labs Lab 12/11/15 1631  TROPONINI 0.09*    Microbiology Results  Results for orders placed or performed during the hospital encounter of 12/10/15  Culture, blood (routine x 2)     Status: Abnormal   Collection Time: 12/10/15  8:09 PM  Result Value Ref Range Status   Specimen Description BLOOD RIGHT FOREARM  Final   Special Requests BOTTLES DRAWN AEROBIC AND ANAEROBIC 5CC  Final   Culture  Setup Time   Final    GRAM NEGATIVE RODS IN BOTH AEROBIC AND ANAEROBIC BOTTLES CRITICAL RESULT CALLED TO, READ BACK BY AND VERIFIED  WITH: HOLLY GILLIAN ON 12/11/15 AT 0911  [facility] Performed at [place] (A)  Final   Report Status 12/13/2015 FINAL  Final   Organism ID, Bacteria ESCHERICHIA COLI  Final      Susceptibility   Escherichia coli - MIC*    AMPICILLIN >=32 RESISTANT Resistant     CEFAZOLIN <=4 SENSITIVE Sensitive     CEFEPIME <=1 SENSITIVE Sensitive     CEFTAZIDIME <=1 SENSITIVE Sensitive     CEFTRIAXONE <=1 SENSITIVE Sensitive     CIPROFLOXACIN <=0.25 SENSITIVE Sensitive     GENTAMICIN <=1 SENSITIVE Sensitive     IMIPENEM <=0.25 SENSITIVE Sensitive     TRIMETH/SULFA <=20 SENSITIVE Sensitive     AMPICILLIN/SULBACTAM >=32 RESISTANT Resistant     PIP/TAZO <=4 SENSITIVE Sensitive     Extended ESBL NEGATIVE Sensitive     * ESCHERICHIA COLI  Blood Culture ID Panel (Reflexed)     Status: Abnormal   Collection Time: 12/10/15  8:09 PM  Result Value Ref Range Status   Enterococcus species NOT DETECTED NOT DETECTED Final   Vancomycin resistance NOT DETECTED NOT DETECTED Final   Listeria monocytogenes NOT DETECTED NOT DETECTED Final   Staphylococcus species NOT DETECTED NOT DETECTED Final   Staphylococcus aureus NOT DETECTED NOT DETECTED Final   Methicillin resistance NOT DETECTED NOT DETECTED Final   Streptococcus species NOT DETECTED NOT DETECTED Final   Streptococcus agalactiae NOT DETECTED NOT DETECTED Final   Streptococcus pneumoniae NOT DETECTED NOT DETECTED Final   Streptococcus pyogenes NOT DETECTED NOT DETECTED Final   Acinetobacter baumannii NOT DETECTED NOT DETECTED Final   Enterobacteriaceae species DETECTED (A) NOT DETECTED Final    Comment: CRITICAL RESULT CALLED TO, READ BACK BY AND VERIFIED WITH: HOLLY GILLIAN ON 12/11/15 AT 0911 [facility]    Enterobacter cloacae complex NOT DETECTED NOT DETECTED Final   Escherichia coli DETECTED (A) NOT DETECTED Final    Comment: CRITICAL RESULT CALLED TO, READ BACK BY AND VERIFIED WITH: HOLLY GILLIAN ON 12/11/15 AT 0911 [facility]     Klebsiella oxytoca NOT DETECTED NOT DETECTED Final   Klebsiella pneumoniae NOT DETECTED NOT DETECTED Final   Proteus species NOT DETECTED NOT DETECTED Final   Serratia marcescens NOT DETECTED NOT DETECTED Final   Carbapenem resistance NOT DETECTED NOT DETECTED Final   Haemophilus influenzae NOT DETECTED NOT DETECTED Final   Neisseria meningitidis NOT DETECTED NOT DETECTED Final   Pseudomonas aeruginosa NOT DETECTED NOT DETECTED Final   Candida albicans NOT DETECTED NOT DETECTED Final   Candida glabrata NOT DETECTED NOT DETECTED Final   Candida krusei NOT DETECTED NOT DETECTED Final   Candida parapsilosis NOT DETECTED NOT DETECTED Final   Candida tropicalis NOT DETECTED NOT DETECTED Final  Culture, blood (routine x 2)     Status: Abnormal   Collection Time: 12/10/15  8:33 PM  Result Value Ref Range Status   Specimen Description BLOOD RIGHT ANTECUBITAL  Final   Special Requests   Final    BOTTLES DRAWN AEROBIC AND ANAEROBIC 15CCAERO,8CCANA   Culture  Setup Time   Final    GRAM NEGATIVE RODS IN BOTH AEROBIC AND ANAEROBIC BOTTLES CRITICAL VALUE NOTED.  VALUE IS CONSISTENT WITH PREVIOUSLY REPORTED AND CALLED VALUE. [place]    Culture (A)  Final    ESCHERICHIA COLI SUSCEPTIBILITIES PERFORMED ON PREVIOUS CULTURE WITHIN THE LAST 5 DAYS. Performed at [facility]    Report Status 12/13/2015 FINAL  Final  Urine culture     Status: Abnormal   Collection Time: 12/10/15  9:33 PM  Result Value Ref Range Status   Specimen Description URINE, RANDOM  Final   Special Requests NONE  Final   Culture 60,000 COLONIES/mL ESCHERICHIA COLI (A)  Final   Report Status 12/13/2015 FINAL  Final   Organism ID, Bacteria ESCHERICHIA COLI (A)  Final      Susceptibility   Escherichia coli - MIC*    AMPICILLIN >=32 RESISTANT Resistant     CEFAZOLIN <=4 SENSITIVE Sensitive     CEFTRIAXONE <=1 SENSITIVE Sensitive     CIPROFLOXACIN <=0.25 SENSITIVE Sensitive     GENTAMICIN <=1 SENSITIVE Sensitive      IMIPENEM <=0.25 SENSITIVE Sensitive     NITROFURANTOIN <=16 SENSITIVE Sensitive     TRIMETH/SULFA <=20 SENSITIVE Sensitive     AMPICILLIN/SULBACTAM >=32 RESISTANT Resistant     PIP/TAZO <=4 SENSITIVE Sensitive     Extended ESBL NEGATIVE Sensitive     * 60,000 COLONIES/mL ESCHERICHIA COLI    RADIOLOGY:  No results found.  EKG:   Orders placed or performed during the hospital encounter of 12/10/15  . ED EKG  . ED EKG  . EKG 12-Lead  . EKG 12-Lead      Management plans discussed with the patient, family and they are in agreement.  CODE STATUS:     Code Status Orders        Start     Ordered   12/10/15 2320  Full code  Continuous     12/10/15 2319    Code Status History    Date Active Date Inactive Code Status Order ID Comments User Context   This patient has a current code status but no historical code status.    Advance Directive Documentation   Flowsheet Row Most Recent Value  Type of Advance Directive  Healthcare Power of Attorney, Living will  Pre-existing out of facility DNR order (yellow form or pink MOST form)  No data  "MOST" Form in Place?  No data      TOTAL TIME TAKING CARE OF THIS PATIENT: 35 minutes.    Vaughan Basta M.D on 12/13/2015 at 3:24 PM  Between 7am to 6pm - Pager - [phone]  After 6pm go to [email or web address] - password [email or web address] [facility]  Office  [phone]  CC: Primary care physician; Kirk Ruths., MD   Note: This dictation was prepared with Dragon dictation along with smaller phrase technology. Any transcriptional errors that result from this process are unintentional.

## 2015-12-13 NOTE — Progress Notes (Signed)
Pt prepared for d/c to SNF. IV d/c'd, central monitoring was d/c. Skin intact except as charted in most recent assessments. Vitals are stable. Report called to receiving facility. Pt to be transported by daughter to [place].  Angus Seller

## 2015-12-13 NOTE — Evaluation (Signed)
Physical Therapy Evaluation Patient Details Name: Maria [name] MRN: [id number] DOB: 1930/04/19 Today's Date: 12/13/2015   History of Present Illness  Pt. presented to ED with dizziness and diaphoresis, admitted for UTI and AKI, MI ruled out, troponin levels determined to be result of demand ischemia.   Clinical Impression  Pt. Awake, alert and oriented supine in bed upon arrival. Pt. Demonstrates decreased B UE strength grossly 4/5 with AROM R<L due to hx. Orthopedic surgeries in R shoulder. Pt. Demonstrates decreased B LE strength grossly 4+/5 symmetrical. She transferred from supine to sit, using assist of B UE from bedrails, HOB elevated, and min A for positioning at EOB. Pt. Performed multiple attempts to stand without B UE support from RW and was unable to stand, required min A and  B UE from RW to transfer to standing from EOB. Pt. Demonstrated very small step length, step to pattern, no arm swing, and very slow cadence when attempting gait without AD for approx 6ft.  With use of RW for B UE support pt. Performed approx. 169ft. Of ambulation with improved cadence, step through pattern, and equal step length. Recommend continued use of RW for all ambulation at this time. Would benefit from skilled PT to address above deficits and promote optimal return to PLOF Recommend SNF placement upon d/c to further address skilled PT needs.     Follow Up Recommendations SNF    Equipment Recommendations  Rolling walker with 5" wheels    Recommendations for Other Services       Precautions / Restrictions Precautions Precautions: Fall Restrictions Weight Bearing Restrictions: No      Mobility  Bed Mobility Overal bed mobility: Needs Assistance Bed Mobility: Supine to Sit     Supine to sit: Min assist     General bed mobility comments: Pt. requires B UE assist from bedrails, HOB elevated. requires assist to position at EOB.   Transfers Overall transfer level: Needs assistance Equipment  used: Rolling walker (2 wheeled) Transfers: Sit to/from Stand Sit to Stand: Min assist         General transfer comment: Pt. unable to push up from seated surface to standing with multiple attempts., requires B UE support from RW.   Ambulation/Gait Ambulation/Gait assistance: Min guard Ambulation Distance (Feet): 5 Feet         General Gait Details: Pt. subjectively reports feeling unsteady, she did take a few short steps demonstrating step to pattern, only able to take a few steps before requesting use of AD.  Stairs            Wheelchair Mobility    Modified Rankin (Stroke Patients Only)       Balance Overall balance assessment: Needs assistance Sitting-balance support: Feet supported;Bilateral upper extremity supported Sitting balance-Leahy Scale: Good     Standing balance support: Bilateral upper extremity supported Standing balance-Leahy Scale: Good                               Pertinent Vitals/Pain Pain Assessment: No/denies pain    Home Living Family/patient expects to be discharged to:: Assisted living (Pt. lives in cottage independently at assisted living facility) Living Arrangements: Alone   Type of Home: Independent living facility       Home Layout: One level        Prior Function Level of Independence: Independent         Comments: Pt. would ambulate to dining hall (2 blocks away)  for meals without use of AD. she reports hx. walking about 1 mile daily for exercise.      Hand Dominance        Extremity/Trunk Assessment   Upper Extremity Assessment:  (B UE strength grossly 4/5 symmetrical. AROM limited R<L due to hx. orthopedic surgeries )           Lower Extremity Assessment:  (B LE strength grossly 4+/5 symmetrically )         Communication   Communication: No difficulties  Cognition Arousal/Alertness: Awake/alert Behavior During Therapy: WFL for tasks assessed/performed Overall Cognitive Status:  Within Functional Limits for tasks assessed                      General Comments      Exercises Other Exercises Other Exercises: Pt. performed gait with RW x159ft. CGA, she demonstrated improved cadence, step length, step through pattern and confidence when ambulating with RW.    Assessment/Plan    PT Assessment Patient needs continued PT services  PT Problem List Decreased balance;Decreased range of motion;Decreased strength;Decreased mobility;Decreased knowledge of use of DME          PT Treatment Interventions DME instruction;Gait training;Stair training;Functional mobility training;Therapeutic exercise;Therapeutic activities;Balance training;Patient/family education    PT Goals (Current goals can be found in the Care Plan section)  Acute Rehab PT Goals Patient Stated Goal: Pt. would like to feel stronger before returning home alone PT Goal Formulation: With patient/family Time For Goal Achievement: 12/27/15 Potential to Achieve Goals: Good    Frequency Min 2X/week   Barriers to discharge Decreased caregiver support      Co-evaluation               End of Session Equipment Utilized During Treatment: Gait belt Activity Tolerance: Patient tolerated treatment well Patient left: in chair;with call bell/phone within reach;with family/visitor present;with chair alarm set (Pt. daughter present) Nurse Communication: Mobility status         Time: [id number] PT Time Calculation (min) (ACUTE ONLY): 24 min   Charges:         PT G CodesMelanie Crazier Dec 22, 2015, 4:37 PM

## 2015-12-13 NOTE — Discharge Instructions (Signed)
Follow with Urology and GI clinic in 2-3 weeks.

## 2015-12-13 NOTE — Clinical Social Work Note (Signed)
Clinical Social Work Assessment  Patient Details  Name: Maria [name] MRN: [id number] Date of Birth: 1931-03-07  Date of referral:  12/13/15               Reason for consult:  Facility Placement                Permission sought to share information with:  Facility Sport and exercise psychologist, Family Supports Permission granted to share information::  Yes, Verbal Permission Granted  Name::        Agency::     Relationship::     Contact Information:     Housing/Transportation Living arrangements for the past 2 months:  [place] of Information:  Patient, Adult Children Patient Interpreter Needed:  None Criminal Activity/Legal Involvement Pertinent to Current Situation/Hospitalization:  No - Comment as needed Significant Relationships:  Adult Children Lives with:  Self Do you feel safe going back to the place where you live?  Yes Need for family participation in patient care:  No (Coment)  Care giving concerns:  Patient resides alone at [facility] independent living.   Social Worker assessment / plan:  Patient ready for discharge and has requested PT as she wishes to go to rehab at [facility]. PT assessed patient and due to inability to get up from a chair on her own, are recommending STR. [facility] is able to take patient this afternoon. Discharge information sent to [facility]. Joelene Millin at [facility] is aware. Patient's daughter to transport.  Employment status:  Retired Forensic scientist:  Medicare PT Recommendations:  [place] / Referral to community resources:  [place]  Patient/Family's Response to care:  Patient pleased with being able to go to rehab.  Patient/Family's Understanding of and Emotional Response to Diagnosis, Current Treatment, and Prognosis:  Patient and daughter are eager to begin rehab.   Emotional Assessment Appearance:  Appears stated age Attitude/Demeanor/Rapport:   (pleasant and  cooperative) Affect (typically observed):  Pleasant, Calm, Appropriate Orientation:  Oriented to Self, Oriented to Place, Oriented to  Time, Oriented to Situation Alcohol / Substance use:  Not Applicable Psych involvement (Current and /or in the community):  No (Comment)  Discharge Needs  Concerns to be addressed:  Care Coordination Readmission within the last 30 days:  No Current discharge risk:  None Barriers to Discharge:  No Barriers Identified   Shela Leff, LCSW 12/13/2015, 3:48 PM

## 2015-12-13 NOTE — NC FL2 (Signed)
[place] LEVEL OF CARE SCREENING TOOL     IDENTIFICATION  Patient Name: Maria [name] Birthdate: Oct 07, 1930 Sex: female Admission Date (Current Location): 12/10/2015  [place] and [place] Number:  Engineering geologist and Address:  [facility], [address] [place], [place] [address]      Provider Number: [id number]  Attending Physician Name and Address:  Vaughan Basta, MD  Relative Name and Phone Number:       Current Level of Care: Hospital Recommended Level of Care: [place] Prior Approval Number:    Date Approved/Denied:   PASRR Number: [id number]  Discharge Plan: SNF    Current Diagnoses: Patient Active Problem List   Diagnosis Date Noted  . AKI (acute kidney injury) ([place]) 12/10/2015    Orientation RESPIRATION BLADDER Height & Weight     Self, Time, Situation, Place  Normal Continent Weight: 126 lb (57.2 kg) Height:  5' 2.5" (158.8 cm)  BEHAVIORAL SYMPTOMS/MOOD NEUROLOGICAL BOWEL NUTRITION STATUS   (none)  (none) Continent Diet (heart healthy)  AMBULATORY STATUS COMMUNICATION OF NEEDS Skin   Limited Assist Verbally Normal                       Personal Care Assistance Level of Assistance  Bathing, Feeding, Dressing Bathing Assistance: Independent Feeding assistance: Independent Dressing Assistance: Independent     Functional Limitations Info   (no issues)          SPECIAL CARE FACTORS FREQUENCY  PT (By licensed PT)                    Contractures Contractures Info: Not present    Additional Factors Info                  Current Medications (12/13/2015):  This is the current hospital active medication list Current Facility-Administered Medications  Medication Dose Route Frequency Provider Last Rate Last Dose  . acetaminophen (TYLENOL) tablet 650 mg  650 mg Oral Q6H PRN Alexis Hugelmeyer, DO   650 mg at 12/13/15 1428   Or  . acetaminophen (TYLENOL) suppository  650 mg  650 mg Rectal Q6H PRN Alexis Hugelmeyer, DO      . acetaminophen (TYLENOL) tablet 1,000 mg  1,000 mg Oral Q0600 Alexis Hugelmeyer, DO   1,000 mg at 12/13/15 [id number]   And  . acetaminophen (TYLENOL) tablet 500 mg  500 mg Oral QHS Alexis Hugelmeyer, DO   500 mg at 12/12/15 2130  . acidophilus (RISAQUAD) capsule 2 capsule  2 capsule Oral TID Vaughan Basta, MD      . aspirin EC tablet 325 mg  325 mg Oral Once [employer], DO      . bisacodyl (DULCOLAX) EC tablet 5 mg  5 mg Oral Daily PRN [employer], DO      . HYDROcodone-acetaminophen (NORCO/VICODIN) 5-325 MG per tablet 1-2 tablet  1-2 tablet Oral Q4H PRN Alexis Hugelmeyer, DO      . latanoprost (XALATAN) 0.005 % ophthalmic solution 1 drop  1 drop Both Eyes QHS Alexis Hugelmeyer, DO   1 drop at 12/12/15 2131  . levofloxacin (LEVAQUIN) tablet 250 mg  250 mg Oral Daily Alexis Hugelmeyer, DO   250 mg at 12/13/15 1007  . magnesium citrate solution 1 Bottle  1 Bottle Oral Once PRN Alexis Hugelmeyer, DO      . metoprolol succinate (TOPROL-XL) 24 hr tablet 12.5 mg  12.5 mg Oral Daily Alexis Hugelmeyer, DO   12.5  mg at 12/13/15 1007  . nitroGLYCERIN (NITROGLYN) 2 % ointment 0.5 inch  0.5 inch Topical Q6H PRN Lance Coon, MD      . nitroGLYCERIN (NITROSTAT) SL tablet 0.4 mg  0.4 mg Sublingual Q5 min PRN Alexis Hugelmeyer, DO      . ondansetron (ZOFRAN) tablet 4 mg  4 mg Oral Q6H PRN Alexis Hugelmeyer, DO   4 mg at 12/11/15 1330   Or  . ondansetron (ZOFRAN) injection 4 mg  4 mg Intravenous Q6H PRN Alexis Hugelmeyer, DO   4 mg at 12/11/15 1934  . oxyCODONE-acetaminophen (PERCOCET/ROXICET) 5-325 MG per tablet 1 tablet  1 tablet Oral Q6H PRN Vaughan Basta, MD      . phosphorus (K PHOS NEUTRAL) tablet 250 mg  250 mg Oral BID Alexis Hugelmeyer, DO   250 mg at 12/13/15 1027  . senna-docusate (Senokot-S) tablet 1 tablet  1 tablet Oral QHS PRN Alexis Hugelmeyer, DO      . sodium chloride flush (NS) 0.9 % injection 3 mL  3 mL  Intravenous Q12H Alexis Hugelmeyer, DO   3 mL at 12/13/15 1015  . timolol (TIMOPTIC) 0.5 % ophthalmic solution 1 drop  1 drop Both Eyes Daily Alexis Hugelmeyer, DO   1 drop at 12/13/15 0744  . zolpidem (AMBIEN) tablet 5 mg  5 mg Oral QHS PRN Alexis Hugelmeyer, DO         Discharge Medications: Please see discharge summary for a list of discharge medications.  Relevant Imaging Results:  Relevant Lab Results:   Additional Information    Shela Leff, LCSW

## 2015-12-13 NOTE — Clinical Social Work Placement (Signed)
   CLINICAL SOCIAL WORK PLACEMENT  NOTE  Date:  12/13/2015  Patient Details  Name: Maria [name] MRN: [id number] Date of Birth: 1931/03/04  Clinical Social Work is seeking post-discharge placement for this patient at the [place] level of care (*CSW will initial, date and re-position this form in  chart as items are completed):  Yes   Patient/family provided with [place] Work Department's list of facilities offering this level of care within the geographic area requested by the patient (or if unable, by the patient's family).  Yes   Patient/family informed of their freedom to choose among providers that offer the needed level of care, that participate in Medicare, Medicaid or managed care program needed by the patient, have an available bed and are willing to accept the patient.  Yes   Patient/family informed of [facility]'s ownership interest in [facility] and [facility], as well as of the fact that they are under no obligation to receive care at these facilities.  PASRR submitted to EDS on 12/13/15     PASRR number received on 12/13/15     Existing PASRR number confirmed on       FL2 transmitted to all facilities in geographic area requested by pt/family on       FL2 transmitted to all facilities within larger geographic area on       Patient informed that his/her managed care company has contracts with or will negotiate with certain facilities, including the following:        Yes   Patient/family informed of bed offers received.  Patient chooses bed at  [facility])     Physician recommends and patient chooses bed at  [facility])    Patient to be transferred to  [facility]) on 12/13/15.  Patient to be transferred to facility by  (daughter)     Patient family notified on 12/13/15 of transfer.  Name of family member notified:   (daughter)     PHYSICIAN       Additional Comment:     _______________________________________________ Shela Leff, LCSW 12/13/2015, 3:51 PM

## 2015-12-15 ENCOUNTER — Other Ambulatory Visit: Payer: Medicare Other | Admitting: Urology

## 2015-12-15 LAB — COMPREHENSIVE METABOLIC PANEL
ALK PHOS: 100 U/L (ref 38–126)
ALT: 16 U/L (ref 14–54)
ANION GAP: 8 (ref 5–15)
AST: 16 U/L (ref 15–41)
Albumin: 2.7 g/dL — ABNORMAL LOW (ref 3.5–5.0)
BUN: 10 mg/dL (ref 6–20)
CALCIUM: 8.6 mg/dL — AB (ref 8.9–10.3)
CHLORIDE: 100 mmol/L — AB (ref 101–111)
CO2: 26 mmol/L (ref 22–32)
Creatinine, Ser: 0.87 mg/dL (ref 0.44–1.00)
GFR calc non Af Amer: 60 mL/min — ABNORMAL LOW (ref >60)
Glucose, Bld: 84 mg/dL (ref 65–99)
Potassium: 3.5 mmol/L (ref 3.5–5.1)
SODIUM: 134 mmol/L — AB (ref 135–145)
Total Bilirubin: 0.6 mg/dL (ref 0.3–1.2)
Total Protein: 6.1 g/dL — ABNORMAL LOW (ref 6.5–8.1)

## 2015-12-15 LAB — CBC WITH DIFFERENTIAL/PLATELET
Basophils Absolute: 0 10*3/uL (ref 0–0.1)
Basophils Relative: 1 %
EOS ABS: 0.2 10*3/uL (ref 0–0.7)
EOS PCT: 2 %
HCT: 36.9 % (ref 35.0–47.0)
Hemoglobin: 12.5 g/dL (ref 12.0–16.0)
LYMPHS ABS: 1.2 10*3/uL (ref 1.0–3.6)
Lymphocytes Relative: 12 %
MCH: 30.6 pg (ref 26.0–34.0)
MCHC: 33.9 g/dL (ref 32.0–36.0)
MCV: 90.3 fL (ref 80.0–100.0)
MONO ABS: 1.4 10*3/uL — AB (ref 0.2–0.9)
MONOS PCT: 14 %
Neutro Abs: 7.2 10*3/uL — ABNORMAL HIGH (ref 1.4–6.5)
Neutrophils Relative %: 71 %
PLATELETS: 244 10*3/uL (ref 150–440)
RBC: 4.09 MIL/uL (ref 3.80–5.20)
RDW: 13.8 % (ref 11.5–14.5)
WBC: 10.1 10*3/uL (ref 3.6–11.0)

## 2015-12-19 NOTE — Telephone Encounter (Signed)
Is she receiving antibiotics at this time?  It's possible she may have formed an abscess in her kidney.  I suggest that if she is still spiking fevers, she needs to return to the ED.

## 2015-12-19 NOTE — Telephone Encounter (Signed)
Spoke with pt in reference to spiking fevers and taking abx. Pt stated that she is not aware of any of her medical status since being admitted to rehab. Pt stated she will have A.M. Nurse call tomorrow with more information.

## 2015-12-19 NOTE — Telephone Encounter (Signed)
-----   Message from Nori Riis, PA-C sent at 12/11/2015 12:56 PM EDT ----- The prescription of Cipro to her pharmacy before realized that she had been admitted to the hospital. She is currently receiving Levaquin for which the organism is sensitive to in the hospital.  I suggest after she is discharged from the hospital, that we arrange a consultation with an allergist for allergy testing.  I also suggest that we have a trial of Intrarosa for her vaginal atrophy in an effort to reduce recurrent urinary tract infections.

## 2015-12-19 NOTE — Telephone Encounter (Signed)
Spoke with pt in reference to allergy testing. Pt stated that she has an allergist in [facility], Dr. Burnett Kanaris with [facility] [phone]. Pt also stated that she was d/c from the hospital to a rehab facility due to her inability to care for herself at home. Pt also stated that every night right after dinner she spikes a 103+ fever and has since being d/c from hospital. Pt stated "I feel so bad. We really just need to get to the bottom of this!" Please advise.

## 2015-12-22 ENCOUNTER — Other Ambulatory Visit
Admission: RE | Admit: 2015-12-22 | Discharge: 2015-12-22 | Disposition: A | Discharge status: Home / Self Care | Payer: Medicare Other | Source: Skilled Nursing Facility | Attending: Internal Medicine | Admitting: Internal Medicine

## 2015-12-22 DIAGNOSIS — A4151 Sepsis due to Escherichia coli [E. coli]: Secondary | ICD-10-CM | POA: Insufficient documentation

## 2015-12-22 LAB — CBC WITH DIFFERENTIAL/PLATELET
Basophils Absolute: 0.1 10*3/uL (ref 0–0.1)
Basophils Relative: 1 %
EOS ABS: 0.1 10*3/uL (ref 0–0.7)
EOS PCT: 1 %
HCT: 41.1 % (ref 35.0–47.0)
Hemoglobin: 13.5 g/dL (ref 12.0–16.0)
LYMPHS ABS: 1.4 10*3/uL (ref 1.0–3.6)
Lymphocytes Relative: 13 %
MCH: 29.8 pg (ref 26.0–34.0)
MCHC: 32.8 g/dL (ref 32.0–36.0)
MCV: 90.8 fL (ref 80.0–100.0)
MONO ABS: 0.9 10*3/uL (ref 0.2–0.9)
Monocytes Relative: 9 %
Neutro Abs: 7.7 10*3/uL — ABNORMAL HIGH (ref 1.4–6.5)
Neutrophils Relative %: 76 %
PLATELETS: 511 10*3/uL — AB (ref 150–440)
RBC: 4.52 MIL/uL (ref 3.80–5.20)
RDW: 13.8 % (ref 11.5–14.5)
WBC: 10.1 10*3/uL (ref 3.6–11.0)

## 2015-12-22 LAB — BASIC METABOLIC PANEL
Anion gap: 10 (ref 5–15)
BUN: 21 mg/dL — AB (ref 6–20)
CALCIUM: 9.2 mg/dL (ref 8.9–10.3)
CO2: 26 mmol/L (ref 22–32)
CREATININE: 0.89 mg/dL (ref 0.44–1.00)
Chloride: 100 mmol/L — ABNORMAL LOW (ref 101–111)
GFR calc Af Amer: >60 mL/min (ref >60)
GFR, EST NON AFRICAN AMERICAN: 58 mL/min — AB (ref >60)
GLUCOSE: 95 mg/dL (ref 65–99)
Potassium: 3.7 mmol/L (ref 3.5–5.1)
SODIUM: 136 mmol/L (ref 135–145)

## 2015-12-23 ENCOUNTER — Non-Acute Institutional Stay (SKILLED_NURSING_FACILITY): Payer: Medicare Other | Admitting: Gerontology

## 2015-12-23 DIAGNOSIS — N10 Acute tubulo-interstitial nephritis: Secondary | ICD-10-CM | POA: Diagnosis not present

## 2015-12-23 DIAGNOSIS — A4151 Sepsis due to Escherichia coli [E. coli]: Secondary | ICD-10-CM

## 2015-12-25 ENCOUNTER — Encounter: Admission: RE | Admit: 2015-12-25 | Payer: Self-pay | Source: Ambulatory Visit | Admitting: Internal Medicine

## 2015-12-26 NOTE — Progress Notes (Signed)
Location:      Place of Service:  SNF (31)  Provider: Toni Arthurs, NP-C  PCP: Kirk Ruths., MD Patient Care Team: Kirk Ruths, MD as PCP - General (Internal Medicine)  Extended Emergency Contact Information Primary Emergency Contact: [place] of [place] Phone: [phone] Relation: Daughter  Code Status: DO NOT RESUSCITATE Goals of care:  Advanced Directive information Advanced Directives 12/12/2015  Does patient have an advance directive? -  Type of Advance Directive -  Does patient want to make changes to advanced directive? -  Copy of advanced directive(s) in chart? Yes  Would patient like information on creating an advanced directive? -     Allergies  Allergen Reactions  . Penicillin G Hives    AGE 70 yo PCN allergy  . Sulfa Antibiotics Other (See Comments) and Anaphylaxis  . Sulfamethoxazole-Trimethoprim Itching, Palpitations, Other (See Comments), Rash and Swelling    Shaking, redness, sneezing  . Articaine-Epinephrine Other (See Comments)  . Darvon [Propoxyphene] Other (See Comments)    Altered mental status  . Erythromycin Other (See Comments)  . Guaifenesin Other (See Comments)    Insomnia and anxiety  . Levofloxacin Other (See Comments)    750mg  dosage after 24 hours caused confusion, off-balanced CNS  . Metronidazole Nausea Only    Gi upset  . Other Other (See Comments)    septocaine caused numbness  . Penicillins Swelling  . Statins Other (See Comments)  . Tetracycline Other (See Comments)  . Erythromycin Base Rash    GI upset  . Macrobid [Nitrofurantoin] Rash  . Spinach Palpitations    Irregular heartbeat  . Tape Rash  . Tetracyclines & Related Rash    Chief Complaint  Patient presents with  . Discharge Note    HPI:  82 y.o. female been seen today for discharge evaluation. Patient was admitted to facility for rehabilitation following hospitalization for sepsis and pyelonephritis patient  completed or so of antibiotics while here. Patient progressed well PT and feels she is ready to go home. Vital signs stable no other complaints.      Past Medical History:  Diagnosis Date  . Arthritis    fingers, knees  . CHF (congestive heart failure) ([place])   . Complication of anesthesia    Pt reports "coded" during spinal injection during back surgery - 8 yrs ago - [place], [place]  . Environmental and seasonal allergies   . Hypercholesteremia   . Hypertension   . Left arm weakness    positional  . Left bundle branch block (LBBB)   . Motion sickness    seasick  . Seizures ([place]) 06/2013   reaction to exposure to allergan (dogs and cats)  . Vasovagal episode     Past Surgical History:  Procedure Laterality Date  . BACK SURGERY     multiple  . CATARACT EXTRACTION W/ INTRAOCULAR LENS  IMPLANT, BILATERAL    . JOINT REPLACEMENT     knee  . MASS EXCISION Left 12/15/2014   Procedure: MINOR EXCISION OF MASS LEFT INCLUSION CYST;  Surgeon: Beverly Gust, MD;  Location: [facility];  Service: ENT;  Laterality: Left;  NEEDS 2 NURSE  . ROTATOR CUFF REPAIR Bilateral       reports that she has never smoked. She has never used smokeless tobacco. She reports that she does not drink alcohol. Her drug history is not on file. Social History   Social History  . Marital status: Widowed    Spouse name: N/A  .  Number of children: N/A  . Years of education: N/A   Occupational History  . Not on file.   Social History Main Topics  . Smoking status: Never Smoker  . Smokeless tobacco: Never Used  . Alcohol use No  . Drug use: Unknown  . Sexual activity: Not on file   Other Topics Concern  . Not on file   Social History Narrative  . No narrative on file   Functional Status Survey:    Allergies  Allergen Reactions  . Penicillin G Hives    AGE 75 yo PCN allergy  . Sulfa Antibiotics Other (See Comments) and Anaphylaxis  . Sulfamethoxazole-Trimethoprim Itching, Palpitations,  Other (See Comments), Rash and Swelling    Shaking, redness, sneezing  . Articaine-Epinephrine Other (See Comments)  . Darvon [Propoxyphene] Other (See Comments)    Altered mental status  . Erythromycin Other (See Comments)  . Guaifenesin Other (See Comments)    Insomnia and anxiety  . Levofloxacin Other (See Comments)    750mg  dosage after 24 hours caused confusion, off-balanced CNS  . Metronidazole Nausea Only    Gi upset  . Other Other (See Comments)    septocaine caused numbness  . Penicillins Swelling  . Statins Other (See Comments)  . Tetracycline Other (See Comments)  . Erythromycin Base Rash    GI upset  . Macrobid [Nitrofurantoin] Rash  . Spinach Palpitations    Irregular heartbeat  . Tape Rash  . Tetracyclines & Related Rash    Pertinent  Health Maintenance Due  Topic Date Due  . DEXA SCAN  05/12/1997  . PNA vac Low Risk Adult (1 of 2 - PCV13) 05/12/1997  . INFLUENZA VACCINE  10/25/2015    Medications:   Medication List       Accurate as of 12/23/15 11:59 PM. Always use your most recent med list.          acetaminophen 500 MG tablet Commonly known as:  TYLENOL Take 500-1,000 mg by mouth 2 (two) times daily. 1000 mg every morning and 500 mg at bedtime   acidophilus Caps capsule Take 2 capsules by mouth 3 (three) times daily.   bisacodyl 5 MG EC tablet Commonly known as:  DULCOLAX Take 1 tablet (5 mg total) by mouth daily as needed for moderate constipation.   latanoprost 0.005 % ophthalmic solution Commonly known as:  XALATAN Place 1 drop into both eyes at bedtime.   levofloxacin 250 MG tablet Commonly known as:  LEVAQUIN Take 1 tablet (250 mg total) by mouth daily.   metoprolol succinate 25 MG 24 hr tablet Commonly known as:  TOPROL-XL Take 0.5 tablets (12.5 mg total) by mouth daily.   nitroGLYCERIN 0.4 MG SL tablet Commonly known as:  NITROSTAT Place 1 tablet (0.4 mg total) under the tongue every 5 (five) minutes as needed for chest  pain.   oxyCODONE-acetaminophen 5-325 MG tablet Commonly known as:  PERCOCET/ROXICET Take 1 tablet by mouth every 6 (six) hours as needed for severe pain.   ramipril 10 MG capsule Commonly known as:  ALTACE Take 10 mg by mouth daily.   timolol 0.5 % ophthalmic solution Commonly known as:  TIMOPTIC Place 1 drop into both eyes every morning.       Review of Systems  Constitutional: Negative for activity change, appetite change, chills, diaphoresis and fever.  HENT: Negative for congestion, sneezing, sore throat, trouble swallowing and voice change.   Eyes: Negative for pain, redness and visual disturbance.  Respiratory: Negative for apnea, cough,  choking, chest tightness, shortness of breath and wheezing.   Cardiovascular: Negative for chest pain, palpitations and leg swelling.  Gastrointestinal: Negative for abdominal distention, abdominal pain, constipation, diarrhea and nausea.  Genitourinary: Negative for difficulty urinating, dysuria, frequency and urgency.  Musculoskeletal: Negative for back pain, gait problem and myalgias. Arthralgias: typical arthritis.  Skin: Negative for color change, pallor, rash and wound.  Neurological: Negative for dizziness, tremors, syncope, speech difficulty, weakness, numbness and headaches.  Psychiatric/Behavioral: Negative for agitation and behavioral problems.  All other systems reviewed and are negative.   Vitals:   12/23/15 1040  BP: 128/70  Pulse: 70  Resp: 18  Temp: 97.8 F (36.6 C)  SpO2: 100%  Weight: 118 lb 11.2 oz (53.8 kg)   Body mass index is 21.36 kg/m. Physical Exam  Constitutional: She is oriented to person, place, and time. Vital signs are normal. She appears well-developed and well-nourished. She is active and cooperative. She does not appear ill. No distress.  HENT:  Head: Normocephalic and atraumatic.  Mouth/Throat: Uvula is midline, oropharynx is clear and moist and mucous membranes are normal. Mucous membranes are  not pale, not dry and not cyanotic.  Eyes: Conjunctivae, EOM and lids are normal. Pupils are equal, round, and reactive to light.  Neck: Trachea normal, normal range of motion and full passive range of motion without pain. Neck supple. No JVD present. No tracheal deviation, no edema and no erythema present. No thyromegaly present.  Cardiovascular: Normal rate, regular rhythm, normal heart sounds, intact distal pulses and normal pulses.  Exam reveals no gallop, no distant heart sounds and no friction rub.   No murmur heard. Pulmonary/Chest: Effort normal and breath sounds normal. No accessory muscle usage. No respiratory distress. She has no wheezes. She has no rales. She exhibits no tenderness.  Abdominal: Normal appearance and bowel sounds are normal. She exhibits no distension and no ascites. There is no tenderness.  Musculoskeletal: Normal range of motion. She exhibits no edema or tenderness.  Expected osteoarthritis, stiffness  Neurological: She is alert and oriented to person, place, and time. She has normal strength.  Skin: Skin is warm, dry and intact. No rash noted. She is not diaphoretic. No cyanosis or erythema. No pallor. Nails show no clubbing.  Psychiatric: She has a normal mood and affect. Her speech is normal and behavior is normal. Judgment and thought content normal. Cognition and memory are normal.  Nursing note and vitals reviewed.   Labs reviewed: Basic Metabolic Panel:  Recent Labs  12/10/15 2307  12/12/15 0512 12/15/15 0625 12/22/15 1130  NA  --   < > 130* 134* 136  K  --   < > 4.1 3.5 3.7  CL  --   < > 99* 100* 100*  CO2  --   < > 23 26 26   GLUCOSE  --   < > 117* 84 95  BUN  --   < > 23* 10 21*  CREATININE  --   < > 1.03* 0.87 0.89  CALCIUM  --   < > 8.1* 8.6* 9.2  MG 1.5*  --   --   --   --   PHOS 2.1*  --   --   --   --   < > = values in this interval not displayed. Liver Function Tests:  Recent Labs  12/10/15 2009 12/15/15 0625  AST 42* 16  ALT 24  16  ALKPHOS 90 100  BILITOT 1.4* 0.6  PROT 6.9 6.1*  ALBUMIN  3.8 2.7*   No results for input(s): LIPASE, AMYLASE in the last 8760 hours. No results for input(s): AMMONIA in the last 8760 hours. CBC:  Recent Labs  12/10/15 2009  12/12/15 0512 12/15/15 0625 12/22/15 1130  WBC 5.0  < > 11.6* 10.1 10.1  NEUTROABS 4.7  --   --  7.2* 7.7*  HGB 13.0  < > 11.4* 12.5 13.5  HCT 37.5  < > 33.3* 36.9 41.1  MCV 90.0  < > 90.9 90.3 90.8  PLT 213  < > 154 244 511*  < > = values in this interval not displayed. Cardiac Enzymes:  Recent Labs  12/11/15 0504 12/11/15 1006 12/11/15 1631  TROPONINI 0.10* 0.08* 0.09*   BNP: Invalid input(s): POCBNP CBG:  Recent Labs  12/10/15 2012 12/13/15 1534  GLUCAP 133* 104*    Procedures and Imaging Studies During Stay: Ct Abdomen Pelvis Wo Contrast  Result Date: 12/11/2015 CLINICAL DATA:  LEFT side abdominal pain.  History diverticulitis. EXAM: CT ABDOMEN AND PELVIS WITHOUT CONTRAST TECHNIQUE: Multidetector CT imaging of the abdomen and pelvis was performed following the standard protocol without IV contrast. COMPARISON:  CT 07/18/ 2017 FINDINGS: Lower chest: Lung bases are clear. Hepatobiliary: No focal hepatic lesion. No biliary duct dilatation. Gallbladder is normal. Common bile duct is normal. Pancreas: Pancreas is normal. No ductal dilatation. No pancreatic inflammation. Spleen: Normal spleen Adrenals/urinary tract: Adrenal glands normal. There is new perinephric stranding on the LEFT compared to prior exam. Low-attenuation cortical lesions are unchanged. No clear evidence of calculus in the course of the LEFT ureter. Multiple vascular calcifications are noted along the course of the ureter. No bladder calculi. No RIGHT nephrolithiasis or ureterolithiasis. Bladder mildly thick-walled at 7 mm. Stomach/Bowel: Stomach, duodenum, small-bowel, appendix and cecum normal. The diverticula of the sigmoid colon descending colon without acute inflammation.  Rectum normal. Vascular/Lymphatic: Abdominal aorta is normal caliber with atherosclerotic calcification. There is no retroperitoneal or periportal lymphadenopathy. No pelvic lymphadenopathy. Small retroperitoneal lymph nodes similar comparison exam. Exam Reproductive: Uterus and adnexa normal Other: No free fluid. Musculoskeletal: No aggressive osseous lesion. IMPRESSION: 1. Interval perinephric stranding within the LEFT pararenal space. Recommend correlation with pyelonephritis / urinary tract infection. No obstructing calculus is identified. 2. Mildly thick-walled bladder is nonspecific. 3. Diverticulosis without evidence acute diverticulitis. Electronically Signed   By: Suzy Bouchard M.D.   On: 12/11/2015 11:45   US Renal  Result Date: 12/11/2015 CLINICAL DATA:  Patient with history of renal insufficiency. EXAM: RENAL / URINARY TRACT ULTRASOUND COMPLETE COMPARISON:  CT abdomen pelvis 10/11/2015 FINDINGS: Right Kidney: Length: 9.9 cm. Normal renal cortical thickness and echogenicity. No hydronephrosis. Too small to characterize hypoechoic lesion within the interpolar region of the right kidney. Left Kidney: Length: 11.2 cm. Normal renal cortical thickness and echogenicity. Mild hydronephrosis. Two possible adjacent echogenic foci within the superior pole of the left kidney, raising the possibility of nephrolithiasis. Large cyst off the inferior pole left kidney measuring approximately 6 cm. Bladder: Appears normal for degree of bladder distention. IMPRESSION: Mild left hydronephrosis. Possible left nephrolithiasis. Electronically Signed   By: Lovey Newcomer M.D.   On: 12/11/2015 08:59    Assessment/Plan:   1. Acute pyelonephritis  Resolved  Discussed with patient the option of patient discussing the use of estrogen cream vaginally for UTI prevention  2. Sepsis due to Escherichia coli [facility])  Per patient request, checked CBC to evaluate WBC, sure patient is no longer septic   Patient is being  discharged with the following home  health services:    Patient is being discharged with the following durable medical equipment:    Patient has been advised to f/u with their PCP in 1-2 weeks to bring them up to date on their rehab stay.  Social services at facility was responsible for arranging this appointment.  Pt was provided with a 30 day supply of prescriptions for medications and refills must be obtained from their PCP.  For controlled substances, a more limited supply may be provided adequate until PCP appointment only.  Future labs/tests needed:    Family/ staff Communication:   Total Time:  Documentation:  Face to Face:  Family/Phone:  Vikki Ports, NP-C [facility] [address], [place] [address] Cell Phone (Mon-Fri 8am-5pm):  [phone] On Call:  [phone] & follow prompts after 5pm & weekends Office Phone:  [phone] Office Fax:  [phone]

## 2016-01-11 ENCOUNTER — Ambulatory Visit (INDEPENDENT_AMBULATORY_CARE_PROVIDER_SITE_OTHER): Payer: Medicare Other | Admitting: Urology

## 2016-01-11 VITALS — BP 167/84 | HR 72 | Ht 62.0 in | Wt 121.0 lb

## 2016-01-11 DIAGNOSIS — N390 Urinary tract infection, site not specified: Secondary | ICD-10-CM

## 2016-01-11 DIAGNOSIS — R310 Gross hematuria: Secondary | ICD-10-CM

## 2016-01-11 DIAGNOSIS — N362 Urethral caruncle: Secondary | ICD-10-CM

## 2016-01-11 LAB — URINALYSIS, COMPLETE
BILIRUBIN UA: NEGATIVE
Glucose, UA: NEGATIVE
Ketones, UA: NEGATIVE
Nitrite, UA: NEGATIVE
PH UA: 6 (ref 5.0–7.5)
PROTEIN UA: NEGATIVE
SPEC GRAV UA: 1.01 (ref 1.005–1.030)
Urobilinogen, Ur: 0.2 mg/dL (ref 0.2–1.0)

## 2016-01-11 LAB — MICROSCOPIC EXAMINATION: WBC, UA: >30 /hpf — AB (ref 0–?)

## 2016-01-11 MED ORDER — LIDOCAINE HCL 2 % EX GEL
1.0000 "application " | Freq: Once | CUTANEOUS | Status: AC
  Administered 2016-01-11: 1 via URETHRAL

## 2016-01-11 MED ORDER — CIPROFLOXACIN HCL 500 MG PO TABS
500.0000 mg | ORAL_TABLET | Freq: Once | ORAL | Status: AC
  Administered 2016-01-11: 500 mg via ORAL

## 2016-01-11 MED ORDER — ESTROGENS, CONJUGATED 0.625 MG/GM VA CREA: 1.0000 | TOPICAL_CREAM | Freq: Every day | VAGINAL | 12 refills | Status: DC

## 2016-01-11 NOTE — Progress Notes (Signed)
   01/11/16  CC:  Chief Complaint  Patient presents with  . Cysto    HPI: 82 year old female who presents today for cystoscopy to complete her workup for an episode of gross hematuria as well as microscopic hematuria.  She does have a personal history of recurrent urinary tract infections but otherwise no remarkable GU history.  Most recently, she was admitted on 12/10/2015 with Escherichia coli bacteremia.  Previous imaging including CT abdomen pelvis with contrast on 7/17 for left lower quadrant pain found to have diverticulitis and a simple left renal cyst, otherwise no GU pathology. She has no delayed imaging available.     She is a nonsmoker and no secondhand smoking exposure. No chemical exposure.  Blood pressure (!) 167/84, pulse 72, height 5\' 2"  (1.575 m), weight 121 lb (54.9 kg). NED. A&Ox3.   No respiratory distress   Abd soft, NT, ND Normal external genitalia with patent urethral meatus  Cystoscopy Procedure Note  Patient identification was confirmed, informed consent was obtained, and patient was prepped using Betadine solution.  Lidocaine jelly was administered per urethral meatus.    Preoperative abx where received prior to procedure.    Procedure: - Flexible cystoscope introduced, without any difficulty.   - Thorough search of the bladder revealed:    normal urethral meatus with small urethral caruncle noted    normal urothelium    no stones    no ulcers     no tumors    no urethral polyps    no trabeculation  - Ureteral orifices were normal in position and appearance.  Post-Procedure: - Patient tolerated the procedure well  Assessment/ Plan:  1. Microscopic hematuria Status post negative workup including cystoscopy today and multiple recent negative scans No upper tract delayed imaging although low risk given nonsmoking history  2.  Recurrent UTIs Lengthy discussion today about UTI prevention including hygiene issues I have recommended topical  estrogen cream to be applied small pea-sized amt per urethra Monday/Wednesday/Friday daily at bedtime Also recommend daily probiotic as well as Georgann Housekeeper tabs twice a day Signs and symptoms of UTI were reviewed- advised to call our office if she develops any symptoms for lab/nurse visit Pyuria noted today, suspect possible chronic colonization  UCx today, may consider treating today if + culture given recent instrumentation  3. Urethral caruncle/atrophic vaginitis Recommend topical estrogen cream as above  Hollice Espy, MD

## 2016-01-16 LAB — CULTURE, URINE COMPREHENSIVE

## 2016-03-14 HISTORY — PX: MELANOMA EXCISION: SHX5266

## 2016-04-02 ENCOUNTER — Other Ambulatory Visit: Payer: Self-pay

## 2016-04-02 ENCOUNTER — Other Ambulatory Visit: Payer: Self-pay | Admitting: Nurse Practitioner

## 2016-04-02 ENCOUNTER — Other Ambulatory Visit: Admission: RE | Admit: 2016-04-02 | Payer: Medicare Other | Source: Ambulatory Visit | Admitting: Nurse Practitioner

## 2016-04-02 ENCOUNTER — Ambulatory Visit
Admission: RE | Admit: 2016-04-02 | Discharge: 2016-04-02 | Disposition: A | Discharge status: Home / Self Care | Payer: Medicare Other | Source: Ambulatory Visit | Attending: Nurse Practitioner | Admitting: Nurse Practitioner

## 2016-04-02 ENCOUNTER — Inpatient Hospital Stay
Admission: AD | Admit: 2016-04-02 | Discharge: 2016-04-05 | DRG: 392 | Disposition: A | Discharge status: Home / Self Care | Payer: Medicare Other | Source: Ambulatory Visit | Attending: Internal Medicine | Admitting: Internal Medicine

## 2016-04-02 DIAGNOSIS — R1032 Left lower quadrant pain: Secondary | ICD-10-CM

## 2016-04-02 DIAGNOSIS — Z79890 Hormone replacement therapy (postmenopausal): Secondary | ICD-10-CM

## 2016-04-02 DIAGNOSIS — K5720 Diverticulitis of large intestine with perforation and abscess without bleeding: Secondary | ICD-10-CM | POA: Diagnosis present

## 2016-04-02 DIAGNOSIS — E7800 Pure hypercholesterolemia, unspecified: Secondary | ICD-10-CM | POA: Diagnosis present

## 2016-04-02 DIAGNOSIS — Z87440 Personal history of urinary (tract) infections: Secondary | ICD-10-CM | POA: Diagnosis not present

## 2016-04-02 DIAGNOSIS — Z66 Do not resuscitate: Secondary | ICD-10-CM | POA: Diagnosis present

## 2016-04-02 DIAGNOSIS — Z79899 Other long term (current) drug therapy: Secondary | ICD-10-CM | POA: Diagnosis not present

## 2016-04-02 DIAGNOSIS — I447 Left bundle-branch block, unspecified: Secondary | ICD-10-CM | POA: Diagnosis present

## 2016-04-02 DIAGNOSIS — I10 Essential (primary) hypertension: Secondary | ICD-10-CM | POA: Diagnosis present

## 2016-04-02 DIAGNOSIS — H409 Unspecified glaucoma: Secondary | ICD-10-CM | POA: Diagnosis present

## 2016-04-02 DIAGNOSIS — R509 Fever, unspecified: Secondary | ICD-10-CM

## 2016-04-02 LAB — BASIC METABOLIC PANEL
Anion gap: 6 (ref 5–15)
BUN: 13 mg/dL (ref 6–20)
CO2: 27 mmol/L (ref 22–32)
Calcium: 9.2 mg/dL (ref 8.9–10.3)
Chloride: 105 mmol/L (ref 101–111)
Creatinine, Ser: 0.71 mg/dL (ref 0.44–1.00)
GFR calc Af Amer: >60 mL/min (ref >60)
GFR calc non Af Amer: >60 mL/min (ref >60)
Glucose, Bld: 94 mg/dL (ref 65–99)
Potassium: 4.1 mmol/L (ref 3.5–5.1)
Sodium: 138 mmol/L (ref 135–145)

## 2016-04-02 LAB — CBC WITH DIFFERENTIAL/PLATELET
Basophils Absolute: 0.1 10*3/uL (ref 0–0.1)
Basophils Relative: 1 %
Eosinophils Absolute: 0.2 10*3/uL (ref 0–0.7)
Eosinophils Relative: 2 %
HCT: 37.6 % (ref 35.0–47.0)
Hemoglobin: 12.7 g/dL (ref 12.0–16.0)
Lymphocytes Relative: 12 %
Lymphs Abs: 1 10*3/uL (ref 1.0–3.6)
MCH: 29.9 pg (ref 26.0–34.0)
MCHC: 33.7 g/dL (ref 32.0–36.0)
MCV: 88.8 fL (ref 80.0–100.0)
Monocytes Absolute: 0.7 10*3/uL (ref 0.2–0.9)
Monocytes Relative: 8 %
Neutro Abs: 6.5 10*3/uL (ref 1.4–6.5)
Neutrophils Relative %: 77 %
Platelets: 272 10*3/uL (ref 150–440)
RBC: 4.23 MIL/uL (ref 3.80–5.20)
RDW: 13.3 % (ref 11.5–14.5)
WBC: 8.4 10*3/uL (ref 3.6–11.0)

## 2016-04-02 LAB — URINALYSIS, COMPLETE (UACMP) WITH MICROSCOPIC
BACTERIA UA: NONE SEEN
Bilirubin Urine: NEGATIVE
Glucose, UA: NEGATIVE mg/dL
KETONES UR: 5 mg/dL — AB
LEUKOCYTES UA: NEGATIVE
Nitrite: NEGATIVE
PROTEIN: NEGATIVE mg/dL
Specific Gravity, Urine: 1.017 (ref 1.005–1.030)
pH: 6 (ref 5.0–8.0)

## 2016-04-02 MED ORDER — RAMIPRIL 10 MG PO CAPS
10.0000 mg | ORAL_CAPSULE | Freq: Two times a day (BID) | ORAL | Status: DC
  Administered 2016-04-02 – 2016-04-05 (×6): 10 mg via ORAL
  Filled 2016-04-02 (×7): qty 1

## 2016-04-02 MED ORDER — METRONIDAZOLE IN NACL 5-0.79 MG/ML-% IV SOLN
500.0000 mg | Freq: Three times a day (TID) | INTRAVENOUS | Status: DC
  Administered 2016-04-02 – 2016-04-05 (×9): 500 mg via INTRAVENOUS
  Filled 2016-04-02 (×11): qty 100

## 2016-04-02 MED ORDER — IOPAMIDOL (ISOVUE-300) INJECTION 61%
100.0000 mL | Freq: Once | INTRAVENOUS | Status: AC | PRN
  Administered 2016-04-02: 100 mL via INTRAVENOUS

## 2016-04-02 MED ORDER — ACETAMINOPHEN 325 MG PO TABS
650.0000 mg | ORAL_TABLET | Freq: Four times a day (QID) | ORAL | Status: DC | PRN
  Administered 2016-04-03 (×3): 650 mg via ORAL
  Filled 2016-04-02 (×3): qty 2

## 2016-04-02 MED ORDER — LEVOFLOXACIN IN D5W 250 MG/50ML IV SOLN
250.0000 mg | INTRAVENOUS | Status: DC
  Administered 2016-04-03 – 2016-04-04 (×2): 250 mg via INTRAVENOUS
  Filled 2016-04-02 (×3): qty 50

## 2016-04-02 MED ORDER — MORPHINE SULFATE (PF) 4 MG/ML IV SOLN: 1.0000 mg | INTRAVENOUS | Status: DC | PRN

## 2016-04-02 MED ORDER — OXYCODONE HCL 5 MG PO TABS: 5.0000 mg | ORAL_TABLET | ORAL | Status: DC | PRN

## 2016-04-02 MED ORDER — ACETAMINOPHEN 500 MG PO TABS: 1000.0000 mg | ORAL_TABLET | Freq: Every evening | ORAL | Status: DC | PRN

## 2016-04-02 MED ORDER — ACETAMINOPHEN 650 MG RE SUPP: 650.0000 mg | Freq: Four times a day (QID) | RECTAL | Status: DC | PRN

## 2016-04-02 MED ORDER — SODIUM CHLORIDE 0.9 % IV SOLN
INTRAVENOUS | Status: DC
Start: 2016-04-02 — End: 2016-04-05
  Administered 2016-04-02 – 2016-04-04 (×2): via INTRAVENOUS

## 2016-04-02 MED ORDER — LEVOFLOXACIN IN D5W 500 MG/100ML IV SOLN
500.0000 mg | Freq: Once | INTRAVENOUS | Status: AC
  Administered 2016-04-02: 500 mg via INTRAVENOUS
  Filled 2016-04-02: qty 100

## 2016-04-02 MED ORDER — TIMOLOL MALEATE 0.5 % OP SOLN
1.0000 [drp] | OPHTHALMIC | Status: DC
  Administered 2016-04-03 – 2016-04-05 (×3): 1 [drp] via OPHTHALMIC
  Filled 2016-04-02 (×2): qty 5

## 2016-04-02 MED ORDER — METOPROLOL SUCCINATE ER 25 MG PO TB24
12.5000 mg | ORAL_TABLET | Freq: Every day | ORAL | Status: DC
  Administered 2016-04-03 – 2016-04-05 (×3): 12.5 mg via ORAL
  Filled 2016-04-02 (×3): qty 1

## 2016-04-02 MED ORDER — LATANOPROST 0.005 % OP SOLN
1.0000 [drp] | Freq: Every day | OPHTHALMIC | Status: DC
  Administered 2016-04-02 – 2016-04-04 (×3): 1 [drp] via OPHTHALMIC
  Filled 2016-04-02: qty 2.5

## 2016-04-02 NOTE — H&P (Signed)
[facility] [place] NAME: Maria [name]    MR#:  [id number]  DATE OF BIRTH:  Oct 05, 1930  DATE OF ADMISSION:  04/02/2016  PRIMARY CARE PHYSICIAN: Kirk Ruths., MD   REQUESTING/REFERRING PHYSICIAN: Dawson Bills  CHIEF COMPLAINT:  Since into the hospital as a direct admission for diverticular abscess  HISTORY OF PRESENT ILLNESS:  Maria [name]  is a 83 y.o. female with a known history of Diverticulitis in the past. She presents as a direct admission today after not feeling well on Friday having sweats 2 episodes on Saturday. Sunday she did not feel well and went to the walk-in clinic and had high blood pressure. She noticed some blood in the urine. She's also had some nausea vomiting over the last couple days abdominal pain 2 out of 10 intensity worse with movement but nothing else made it better. She had blood work and a CAT scan that showed diverticulitis and was sent in as a direct admission by Dawson Bills gastroenterology.  PAST MEDICAL HISTORY:   Past Medical History:  Diagnosis Date  . Arthritis    fingers, knees  . CHF (congestive heart failure) ([place])   . Complication of anesthesia    Pt reports "coded" during spinal injection during back surgery - 8 yrs ago - [place], [place]  . Environmental and seasonal allergies   . Hypercholesteremia   . Hypertension   . Left arm weakness    positional  . Left bundle branch block (LBBB)   . Motion sickness    seasick  . Seizures ([place]) 06/2013   reaction to exposure to allergan (dogs and cats)  . Vasovagal episode     PAST SURGICAL HISTORY:   Past Surgical History:  Procedure Laterality Date  . BACK SURGERY     multiple  . CATARACT EXTRACTION W/ INTRAOCULAR LENS  IMPLANT, BILATERAL    . JOINT REPLACEMENT     knee  . MASS EXCISION Left 12/15/2014   Procedure: MINOR EXCISION OF MASS LEFT INCLUSION CYST;  Surgeon: Beverly Gust, MD;  Location: [facility];  Service: ENT;   Laterality: Left;  NEEDS 2 NURSE  . ROTATOR CUFF REPAIR Bilateral     SOCIAL HISTORY:   Social History  Substance Use Topics  . Smoking status: Never Smoker  . Smokeless tobacco: Never Used  . Alcohol use No    FAMILY HISTORY:   Family History  Problem Relation Age of Onset  . Kidney Stones Mother   . Kidney disease Neg Hx     DRUG ALLERGIES:   Allergies  Allergen Reactions  . Penicillin G Hives    AGE 69 yo PCN allergy  . Sulfa Antibiotics Other (See Comments) and Anaphylaxis  . Sulfamethoxazole-Trimethoprim Itching, Palpitations, Other (See Comments), Rash and Swelling    Shaking, redness, sneezing  . Articaine-Epinephrine Other (See Comments)  . Darvon [Propoxyphene] Other (See Comments)    Altered mental status  . Erythromycin Other (See Comments)  . Guaifenesin Other (See Comments)    Insomnia and anxiety  . Levofloxacin Other (See Comments)    750mg  dosage after 24 hours caused confusion, off-balanced CNS  . Metronidazole Nausea Only    Gi upset  . Other Other (See Comments)    septocaine caused numbness  . Penicillins Swelling  . Statins Other (See Comments)  . Tetracycline Other (See Comments)  . Erythromycin Base Rash    GI upset  . Macrobid [Nitrofurantoin] Rash  . Spinach Palpitations  Irregular heartbeat  . Tape Rash  . Tetracyclines & Related Rash    REVIEW OF SYSTEMS:  CONSTITUTIONAL: No fever. Positive for chills and sweats. No fatigue or weakness.  EYES: No blurred or double vision. Has glaucoma.  EARS, NOSE, AND THROAT: No tinnitus or ear pain. No sore throat. Positive for runny nose. RESPIRATORY: Positive for cough. No shortness of breath, wheezing or hemoptysis.  CARDIOVASCULAR: No chest pain, orthopnea, edema.  GASTROINTESTINAL: Positive for nausea, vomiting, and abdominal pain. No blood in bowel movements. Thought she had constipation. GENITOURINARY: No dysuria, hematuria.  ENDOCRINE: No polyuria, nocturia,  HEMATOLOGY: No anemia,  easy bruising or bleeding SKIN: No rash or lesion. MUSCULOSKELETAL: No joint pain or arthritis.   NEUROLOGIC: No tingling, numbness, weakness.  PSYCHIATRY: No anxiety or depression.   MEDICATIONS AT HOME:   Prior to Admission medications   Medication Sig Start Date End Date Taking? Authorizing Provider  acetaminophen (TYLENOL) 500 MG tablet Take 1,000 mg by mouth at bedtime as needed for moderate pain.    Yes Historical Provider, MD  conjugated estrogens (PREMARIN) vaginal cream Place 1 Applicatorful vaginally daily. Use pea sized amount M-W-Fr before bedtime 01/11/16  Yes Hollice Espy, MD  latanoprost (XALATAN) 0.005 % ophthalmic solution Place 1 drop into both eyes at bedtime.    Yes Historical Provider, MD  metoprolol succinate (TOPROL-XL) 25 MG 24 hr tablet Take 0.5 tablets (12.5 mg total) by mouth daily. 12/14/15  Yes Vaughan Basta, MD  ramipril (ALTACE) 10 MG capsule Take 10 mg by mouth 2 (two) times daily.    Yes Historical Provider, MD  timolol (TIMOPTIC) 0.5 % ophthalmic solution Place 1 drop into both eyes every morning.   Yes Historical Provider, MD   Medication reconciliation still undergoing    VITAL SIGNS:  Blood pressure (!) 166/67, pulse 63, temperature 98 F (36.7 C), temperature source Oral.  PHYSICAL EXAMINATION:  GENERAL:  83 y.o.-year-old patient lying in the bed with no acute distress.  EYES: Pupils equal, round, reactive to light and accommodation. No scleral icterus. Extraocular muscles intact.  HEENT: Head atraumatic, normocephalic. Oropharynx and nasopharynx clear.  NECK:  Supple, no jugular venous distention. No thyroid enlargement, no tenderness.  LUNGS: Normal breath sounds bilaterally, no wheezing, rales,rhonchi or crepitation. No use of accessory muscles of respiration.  CARDIOVASCULAR: S1, S2 normal. No murmurs, rubs, or gallops.  ABDOMEN: Soft, Left-sided abdominal pain, positive for distention. Bowel sounds present. No organomegaly or mass.   EXTREMITIES: No pedal edema, cyanosis, or clubbing.  NEUROLOGIC: Cranial nerves II through XII are intact. Muscle strength 5/5 in all extremities. Sensation intact. Gait not checked.  PSYCHIATRIC: The patient is alert and oriented x 3.  SKIN: No rash, lesion, or ulcer.   LABORATORY PANEL:   CBC  Recent Labs Lab 04/02/16 0952  WBC 8.4  HGB 12.7  HCT 37.6  PLT 272   ------------------------------------------------------------------------------------------------------------------  Chemistries   Recent Labs Lab 04/02/16 0952  NA 138  K 4.1  CL 105  CO2 27  GLUCOSE 94  BUN 13  CREATININE 0.71  CALCIUM 9.2   ------------------------------------------------------------------------------------------------------------------   RADIOLOGY:  Ct Abdomen Pelvis W Contrast  Result Date: 04/02/2016 CLINICAL DATA:  Left lower quadrant abdominal pain. History of diverticulitis, sepsis, urinary tract infection and back surgery. EXAM: CT ABDOMEN AND PELVIS WITH CONTRAST TECHNIQUE: Multidetector CT imaging of the abdomen and pelvis was performed using the standard protocol following bolus administration of intravenous contrast. CONTRAST:  110mL ISOVUE-300 IOPAMIDOL (ISOVUE-300) INJECTION 61% COMPARISON:  Noncontrast  CT 12/11/2015. Contrast-enhanced CT 10/11/2015. FINDINGS: Lower chest: Clear lung bases. No significant pleural or pericardial effusion. Hepatobiliary: The liver is normal in density without focal abnormality. No evidence of gallstones, gallbladder wall thickening or biliary dilatation. Pancreas: Unremarkable. No pancreatic ductal dilatation or surrounding inflammatory changes. Spleen: Normal in size without focal abnormality. Adrenals/Urinary Tract: Both adrenal glands appear normal. Bilateral renal cysts are grossly stable, largest in the lower pole of the left kidney, measuring 4.8 cm on image 34. The perinephric soft tissue stranding and mild collecting system dilatation on the left  noted previously have resolved. There is no evidence urinary tract calculus or hydronephrosis. The bladder appears unremarkable. Stomach/Bowel: The stomach, small bowel, appendix and proximal colon demonstrate no significant findings. There are progressive diverticular changes throughout the descending and sigmoid colon associated with new long segment wall thickening of the sigmoid colon. There is a complex fluid collection superior to the sigmoid colon measuring 1.9 x 2.6 cm on image 55 (and 2.4 cm on coronal image 49), consistent with a peridiverticular abscess. No other extraluminal fluid collections are identified. There is no extravasated enteric contrast, bowel obstruction or free air. Vascular/Lymphatic: Prominent retroperitoneal lymph nodes are similar to previous studies, likely reactive. Diffuse aortic and branch vessel atherosclerosis. No evidence of venous occlusion. Reproductive: The peridiverticular abscess associated with sigmoid colon is situated between the uterus and left ovary. There is increased heterogeneity of the uterine fundus, but no gas in the endometrial canal. No adnexal mass. Other: No evidence of abdominal wall mass or hernia. No ascites. Musculoskeletal: No acute osseous findings. There are degenerative and postsurgical changes throughout the lumbar spine. IMPRESSION: 1. Recurrent sigmoid diverticulitis with associated peridiverticular abscess. This abuts the uterus, although there is no definite fistula. The bladder appears stable. 2. No evidence of bowel obstruction or other extraluminal fluid collection. 3. Atherosclerosis, as before. 4. These results will be called to the ordering clinician or representative by the Radiologist Assistant, and communication documented in the PACS or zVision Dashboard. Electronically Signed   By: Richardean Sale M.D.   On: 04/02/2016 11:47    EKG:   ordered  IMPRESSION AND PLAN:   1. Acute diverticulitis with abscess. Case discussed with  general surgery to see the patient. The patient has multiple allergies and I will give IV Levaquin and Flagyl. General surgery to decide on surgery versus interventional radiology procedure versus antibiotics. 2. Essential hypertension continue metoprolol and ramipril 3. History of systolic congestive heart failure. No signs of heart failure on this stay so far. 4. Frequent urinary tract infections I will get a urinalysis and urine culture 5. Glaucoma continue eyedrops 6. History of left bundle branch block    All the records are reviewed. Management plans discussed with the patient, and she is in agreement.  CODE STATUS: DO NOT RESUSCITATE  TOTAL TIME TAKING CARE OF THIS PATIENT: 50 minutes.    Loletha Grayer M.D on 04/02/2016 at 2:34 PM  Between 7am to 6pm - Pager - [phone]  After 6pm call admission pager [phone]  [facility]  [phone]  CC: Primary care physician; Kirk Ruths., MD

## 2016-04-02 NOTE — Consult Note (Signed)
Patient ID: Maria [name], female   DOB: 1931/03/16, 83 y.o.   MRN: [id number]  HPI Maria [name] is a 83 y.o. female w hx of CHF NYHA II, LBBB, HTN and recurrent UTIs that presented to [facility] office for LLQ pain for about 1 week. Pt reports her pain is mainly LLQ and pelvis, sharp, moderate in intensity. Associated Night sweats . Some decrease appetite and nausea. + BM. THis is her third episode of diverticulitis requiring hospitalization w parenteral A/bs . She is very active and independent. Used to be a Archivist. Part of the w/u included a Ct A/P that I have personally reviewed: Is evidence of sigmoid diverticulitis with a small 2 cm abscess. There is no evidence of free air or free perforation. WBC and creat nml HPI  Past Medical History:  Diagnosis Date  . Arthritis    fingers, knees  . CHF (congestive heart failure) ([place])   . Complication of anesthesia    Pt reports "coded" during spinal injection during back surgery - 8 yrs ago - [place], [place]  . Environmental and seasonal allergies   . Hypercholesteremia   . Hypertension   . Left arm weakness    positional  . Left bundle branch block (LBBB)   . Motion sickness    seasick  . Seizures ([place]) 06/2013   reaction to exposure to allergan (dogs and cats)  . Vasovagal episode     Past Surgical History:  Procedure Laterality Date  . BACK SURGERY     multiple  . CATARACT EXTRACTION W/ INTRAOCULAR LENS  IMPLANT, BILATERAL    . JOINT REPLACEMENT     knee  . MASS EXCISION Left 12/15/2014   Procedure: MINOR EXCISION OF MASS LEFT INCLUSION CYST;  Surgeon: Beverly Gust, MD;  Location: [facility];  Service: ENT;  Laterality: Left;  NEEDS 2 NURSE  . ROTATOR CUFF REPAIR Bilateral     Family History  Problem Relation Age of Onset  . Kidney Stones Mother   . Kidney disease Neg Hx     Social History Social History  Substance Use Topics  . Smoking status: Never Smoker  . Smokeless tobacco: Never  Used  . Alcohol use No    Allergies  Allergen Reactions  . Penicillin G Hives    AGE 63 yo PCN allergy  . Sulfa Antibiotics Other (See Comments) and Anaphylaxis  . Sulfamethoxazole-Trimethoprim Itching, Palpitations, Other (See Comments), Rash and Swelling    Shaking, redness, sneezing  . Articaine-Epinephrine Other (See Comments)  . Darvon [Propoxyphene] Other (See Comments)    Altered mental status  . Erythromycin Other (See Comments)  . Guaifenesin Other (See Comments)    Insomnia and anxiety  . Levofloxacin Other (See Comments)    750mg  dosage after 24 hours caused confusion, off-balanced CNS  . Metronidazole Nausea Only    Gi upset  . Other Other (See Comments)    septocaine caused numbness  . Penicillins Swelling  . Statins Other (See Comments)  . Tetracycline Other (See Comments)  . Erythromycin Base Rash    GI upset  . Macrobid [Nitrofurantoin] Rash  . Spinach Palpitations    Irregular heartbeat  . Tape Rash  . Tetracyclines & Related Rash    Current Facility-Administered Medications  Medication Dose Route Frequency Provider Last Rate Last Dose  . 0.9 %  sodium chloride infusion   Intravenous Continuous Loletha Grayer, MD      . acetaminophen (TYLENOL) tablet 650 mg  650 mg  Oral Q6H PRN Loletha Grayer, MD       Or  . acetaminophen (TYLENOL) suppository 650 mg  650 mg Rectal Q6H PRN Loletha Grayer, MD      . latanoprost (XALATAN) 0.005 % ophthalmic solution 1 drop  1 drop Both Eyes QHS Loletha Grayer, MD      . Derrill Memo ON 04/03/2016] Levofloxacin (LEVAQUIN) IVPB 250 mg  250 mg Intravenous Q24H Loletha Grayer, MD      . levofloxacin [facility]) IVPB 500 mg  500 mg Intravenous Once Loletha Grayer, MD      . Derrill Memo ON 04/03/2016] metoprolol succinate (TOPROL-XL) 24 hr tablet 12.5 mg  12.5 mg Oral Daily Loletha Grayer, MD      . metroNIDAZOLE (FLAGYL) IVPB 500 mg  500 mg Intravenous Q8H Loletha Grayer, MD      . morphine 4 MG/ML injection 1 mg  1 mg Intravenous Q4H  PRN Loletha Grayer, MD      . oxyCODONE (Oxy IR/ROXICODONE) immediate release tablet 5 mg  5 mg Oral Q4H PRN Loletha Grayer, MD      . ramipril (ALTACE) capsule 10 mg  10 mg Oral BID Loletha Grayer, MD      . Derrill Memo ON 04/03/2016] timolol (TIMOPTIC) 0.5 % ophthalmic solution 1 drop  1 drop Both Eyes [id number] Loletha Grayer, MD         Review of Systems A 10 point review of systems was asked and was negative except for the information on the HPI  Physical Exam Blood pressure (!) 166/67, pulse 63, temperature 98 F (36.7 C), temperature source Oral. CONSTITUTIONAL: NAD, non toxic EYES: Pupils are equal, round, and reactive to light, Sclera are non-icteric. EARS, NOSE, MOUTH AND THROAT: The oropharynx is clear. The oral mucosa is pink and moist. Hearing is intact to voice. LYMPH NODES:  Lymph nodes in the neck are normal. RESPIRATORY:  Lungs are clear. There is normal respiratory effort, with equal breath sounds bilaterally, and without pathologic use of accessory muscles. CARDIOVASCULAR: Heart is regular without murmurs, gallops, or rubs. GI: The abdomen is soft, There are normal bowel sounds in all quadrants. TTP on LLQ no rebound tenderness, no peritonitis. GU: Rectal deferred.   MUSCULOSKELETAL: Normal muscle strength and tone. No cyanosis or edema.   SKIN: Turgor is good and there are no pathologic skin lesions or ulcers. NEUROLOGIC: Motor and sensation is grossly normal. Cranial nerves are grossly intact. PSYCH:  Oriented to person, place and time. Affect is normal.  Data Reviewed  I have personally reviewed the patient's imaging, laboratory findings and medical records.    Assessment/Plan Obligated diverticulitis with diverticular abscess. Because of the location of the abscess is difficult to access it percutaneously. Also given the size and more likely this will respond to antibiotic therapy alone. For now I recommend medical therapy in the form of broad-spectrum antibiotics,  short period of  nothing by mouth and clinical exams. Discussed with the patient in detail about my thought process. I also discussed with her the potential outcomes from her disease including and there were scattered process that she will become septic and required a Hartman's procedure versus improve with antibiotics alone. I do think that at some point in time and if she wishes to think about it she will benefit from a sigmoid colectomy to prevent the recurrent episodes of diverticulitis. As it counseling provided. We'll continue to follow along with you. No need for emergent surgical intervention at this time  Caroleen Hamman, MD [facility]  04/02/2016, 4:17 PM

## 2016-04-02 NOTE — Progress Notes (Signed)
Pt came to floor at 1312. VSS. Pt was oriented to room and safety plan. Phone in reach.

## 2016-04-02 NOTE — Plan of Care (Signed)
Problem: Fluid Volume: Goal: Ability to maintain a balanced intake and output will improve Outcome: Progressing Tolerating liquid diet, IV filuds

## 2016-04-03 LAB — BASIC METABOLIC PANEL
Anion gap: 8 (ref 5–15)
BUN: 11 mg/dL (ref 6–20)
CO2: 23 mmol/L (ref 22–32)
CREATININE: 0.55 mg/dL (ref 0.44–1.00)
Calcium: 8.4 mg/dL — ABNORMAL LOW (ref 8.9–10.3)
Chloride: 104 mmol/L (ref 101–111)
Glucose, Bld: 97 mg/dL (ref 65–99)
POTASSIUM: 3.6 mmol/L (ref 3.5–5.1)
SODIUM: 135 mmol/L (ref 135–145)

## 2016-04-03 LAB — CBC
HCT: 33 % — ABNORMAL LOW (ref 35.0–47.0)
Hemoglobin: 11.2 g/dL — ABNORMAL LOW (ref 12.0–16.0)
MCH: 30 pg (ref 26.0–34.0)
MCHC: 34 g/dL (ref 32.0–36.0)
MCV: 88.3 fL (ref 80.0–100.0)
Platelets: 244 10*3/uL (ref 150–440)
RBC: 3.74 MIL/uL — AB (ref 3.80–5.20)
RDW: 13 % (ref 11.5–14.5)
WBC: 6.4 10*3/uL (ref 3.6–11.0)

## 2016-04-03 LAB — URINE CULTURE: Culture: <10000 — AB

## 2016-04-03 NOTE — Progress Notes (Signed)
CC: Diverticular abscess Subjective: Pain has improved significantly. Tolerating clear liquid diet. No nausea vomiting  Objective: Vital signs in last 24 hours: Temp:  [97.7 F (36.5 C)-99.1 F (37.3 C)] 97.7 F (36.5 C) (01/09 1225) Pulse Rate:  [52-69] 58 (01/09 1225) Resp:  [14-19] 18 (01/09 1225) BP: (111-142)/(43-100) 142/61 (01/09 1225) SpO2:  [97 %-100 %] 100 % (01/09 1225) Last BM Date: 04/01/16  Intake/Output from previous day: 01/08 0701 - 01/09 0700 In: 744 [I.V.:644; IV Piggyback:100] Out: 1425 [Urine:1425] Intake/Output this shift: Total I/O In: 650 [P.O.:600; IV Piggyback:50] Out: 2400 [Urine:2400]  Physical exam: Elderly female in NAd Abd: soft, NT, no peritonitis Ext: no edema, well perfused  Lab Results: CBC   Recent Labs  04/02/16 0952 04/03/16 0452  WBC 8.4 6.4  HGB 12.7 11.2*  HCT 37.6 33.0*  PLT 272 244   BMET  Recent Labs  04/02/16 0952 04/03/16 0452  NA 138 135  K 4.1 3.6  CL 105 104  CO2 27 23  GLUCOSE 94 97  BUN 13 11  CREATININE 0.71 0.55  CALCIUM 9.2 8.4*   PT/INR No results for input(s): LABPROT, INR in the last 72 hours. ABG No results for input(s): PHART, HCO3 in the last 72 hours.  Invalid input(s): PCO2, PO2  Studies/Results: Ct Abdomen Pelvis W Contrast  Result Date: 04/02/2016 CLINICAL DATA:  Left lower quadrant abdominal pain. History of diverticulitis, sepsis, urinary tract infection and back surgery. EXAM: CT ABDOMEN AND PELVIS WITH CONTRAST TECHNIQUE: Multidetector CT imaging of the abdomen and pelvis was performed using the standard protocol following bolus administration of intravenous contrast. CONTRAST:  156mL ISOVUE-300 IOPAMIDOL (ISOVUE-300) INJECTION 61% COMPARISON:  Noncontrast CT 12/11/2015. Contrast-enhanced CT 10/11/2015. FINDINGS: Lower chest: Clear lung bases. No significant pleural or pericardial effusion. Hepatobiliary: The liver is normal in density without focal abnormality. No evidence of  gallstones, gallbladder wall thickening or biliary dilatation. Pancreas: Unremarkable. No pancreatic ductal dilatation or surrounding inflammatory changes. Spleen: Normal in size without focal abnormality. Adrenals/Urinary Tract: Both adrenal glands appear normal. Bilateral renal cysts are grossly stable, largest in the lower pole of the left kidney, measuring 4.8 cm on image 34. The perinephric soft tissue stranding and mild collecting system dilatation on the left noted previously have resolved. There is no evidence urinary tract calculus or hydronephrosis. The bladder appears unremarkable. Stomach/Bowel: The stomach, small bowel, appendix and proximal colon demonstrate no significant findings. There are progressive diverticular changes throughout the descending and sigmoid colon associated with new long segment wall thickening of the sigmoid colon. There is a complex fluid collection superior to the sigmoid colon measuring 1.9 x 2.6 cm on image 55 (and 2.4 cm on coronal image 49), consistent with a peridiverticular abscess. No other extraluminal fluid collections are identified. There is no extravasated enteric contrast, bowel obstruction or free air. Vascular/Lymphatic: Prominent retroperitoneal lymph nodes are similar to previous studies, likely reactive. Diffuse aortic and branch vessel atherosclerosis. No evidence of venous occlusion. Reproductive: The peridiverticular abscess associated with sigmoid colon is situated between the uterus and left ovary. There is increased heterogeneity of the uterine fundus, but no gas in the endometrial canal. No adnexal mass. Other: No evidence of abdominal wall mass or hernia. No ascites. Musculoskeletal: No acute osseous findings. There are degenerative and postsurgical changes throughout the lumbar spine. IMPRESSION: 1. Recurrent sigmoid diverticulitis with associated peridiverticular abscess. This abuts the uterus, although there is no definite fistula. The bladder  appears stable. 2. No evidence of bowel obstruction or other  extraluminal fluid collection. 3. Atherosclerosis, as before. 4. These results will be called to the ordering clinician or representative by the Radiologist Assistant, and communication documented in the PACS or zVision Dashboard. Electronically Signed   By: Richardean Sale M.D.   On: 04/02/2016 11:47    Anti-infectives: Anti-infectives    Start     Dose/Rate Route Frequency Ordered Stop   04/03/16 1400  Levofloxacin (LEVAQUIN) IVPB 250 mg     250 mg 50 mL/hr over 60 Minutes Intravenous Every 24 hours 04/02/16 1351     04/02/16 1430  levofloxacin (LEVAQUIN) IVPB 500 mg     500 mg 100 mL/hr over 60 Minutes Intravenous  Once 04/02/16 1351 04/02/16 1938   04/02/16 1400  metroNIDAZOLE (FLAGYL) IVPB 500 mg     500 mg 100 mL/hr over 60 Minutes Intravenous Every 8 hours 04/02/16 1351        Assessment/Plan: Diverticular abscess responding to A/ B rx No need for immediate surgical intervention. Recommendation continuation of the antibiotics and make incision to by mouth antibiotics tomorrow  Caroleen Hamman, MD, [facility]  04/03/2016

## 2016-04-03 NOTE — Progress Notes (Signed)
[place] [facility] [place] NAME: Maria [name]    MR#:  [id number]  DATE OF BIRTH:  12-30-1930  SUBJECTIVE:  CHIEF COMPLAINT:  No chief complaint on file. Minimal abd pain, feeling somewhat better than yesterday.  Very concerned considering recurrent episodes.  Requesting GI Dr. Vira Agar evaluation.  While here REVIEW OF SYSTEMS:  Review of Systems  Constitutional: Negative for chills, fever and weight loss.  HENT: Negative for nosebleeds and sore throat.   Eyes: Negative for blurred vision.  Respiratory: Negative for cough, shortness of breath and wheezing.   Cardiovascular: Negative for chest pain, orthopnea, leg swelling and PND.  Gastrointestinal: Positive for abdominal pain. Negative for constipation, diarrhea, heartburn, nausea and vomiting.  Genitourinary: Negative for dysuria and urgency.  Musculoskeletal: Negative for back pain.  Skin: Negative for rash.  Neurological: Negative for dizziness, speech change, focal weakness and headaches.  Endo/Heme/Allergies: Does not bruise/bleed easily.  Psychiatric/Behavioral: Negative for depression.    DRUG ALLERGIES:   Allergies  Allergen Reactions  . Penicillin G Hives    AGE 83 yo PCN allergy  . Sulfa Antibiotics Other (See Comments) and Anaphylaxis  . Sulfamethoxazole-Trimethoprim Itching, Palpitations, Other (See Comments), Rash and Swelling    Shaking, redness, sneezing  . Articaine-Epinephrine Other (See Comments)  . Darvon [Propoxyphene] Other (See Comments)    Altered mental status  . Erythromycin Other (See Comments)  . Guaifenesin Other (See Comments)    Insomnia and anxiety  . Levofloxacin Other (See Comments)    750mg  dosage after 24 hours caused confusion, off-balanced CNS  . Metronidazole Nausea Only    Gi upset  . Other Other (See Comments)    septocaine caused numbness  . Penicillins Swelling  . Statins Other (See Comments)  . Tetracycline Other (See Comments)  .  Erythromycin Base Rash    GI upset  . Macrobid [Nitrofurantoin] Rash  . Spinach Palpitations    Irregular heartbeat  . Tape Rash  . Tetracyclines & Related Rash   VITALS:  Blood pressure (!) 142/61, pulse (!) 58, temperature 97.7 F (36.5 C), temperature source Oral, resp. rate 18, height 5\' 2"  (1.575 m), weight 59.1 kg (130 lb 4.8 oz), SpO2 100 %. PHYSICAL EXAMINATION:  Physical Exam  Constitutional: She is oriented to person, place, and time and well-developed, well-nourished, and in no distress.  HENT:  Head: Normocephalic and atraumatic.  Eyes: Conjunctivae and EOM are normal. Pupils are equal, round, and reactive to light.  Neck: Normal range of motion. Neck supple. No tracheal deviation present. No thyromegaly present.  Cardiovascular: Normal rate, regular rhythm and normal heart sounds.   Pulmonary/Chest: Effort normal and breath sounds normal. No respiratory distress. She has no wheezes. She exhibits no tenderness.  Abdominal: Soft. Bowel sounds are normal. She exhibits no distension. There is tenderness in the left lower quadrant.  Musculoskeletal: Normal range of motion.  Neurological: She is alert and oriented to person, place, and time. No cranial nerve deficit.  Skin: Skin is warm and dry. No rash noted.  Psychiatric: Mood and affect normal.   LABORATORY PANEL:   CBC  Recent Labs Lab 04/03/16 0452  WBC 6.4  HGB 11.2*  HCT 33.0*  PLT 244   ------------------------------------------------------------------------------------------------------------------ Chemistries   Recent Labs Lab 04/03/16 0452  NA 135  K 3.6  CL 104  CO2 23  GLUCOSE 97  BUN 11  CREATININE 0.55  CALCIUM 8.4*   RADIOLOGY:  No results found. ASSESSMENT AND PLAN:  1. Acute diverticulitis with abscess - continue IV Levaquin and Flagyl. - General surgery Recommends conservative management. - Patient request GI evaluation by Dr. Vira Agar as he knows her well from office.  We will  place a consult for them 2. Essential hypertension continue metoprolol and ramipril 3. History of systolic congestive heart failure. No signs of heart failure on this stay so far. 4. Frequent urinary tract infections: UA looks normal.  There is no UTI at this time 5. Glaucoma continue eyedrops 6. History of chronic left bundle branch block     All the records are reviewed and case discussed with Care Management/Social Worker. Management plans discussed with the patient, family and they are in agreement.  CODE STATUS: FULL CODE  TOTAL TIME TAKING CARE OF THIS PATIENT: 35 minutes.   More than 50% of the time was spent in counseling/coordination of care: YES  POSSIBLE D/C IN 1-2 DAYS, DEPENDING ON CLINICAL CONDITION.  And surgical/gi evaluation   Max Sane M.D on 04/03/2016 at 4:39 PM  Between 7am to 6pm - Pager - [phone]  After 6pm go to [email or web address] - Proofreader  [facility]  Office  [phone]  CC: Primary care physician; Kirk Ruths., MD  Note: This dictation was prepared with Dragon dictation along with smaller phrase technology. Any transcriptional errors that result from this process are unintentional.

## 2016-04-04 NOTE — Progress Notes (Signed)
[facility] [place] NAME: Loribeth Gillen    MR#:  [id number]  DATE OF BIRTH:  1931-02-08  SUBJECTIVE:  CHIEF COMPLAINT:  No chief complaint on file. still having pain, requests to keep her on CLD and go slow as she is afraid to get worse. REVIEW OF SYSTEMS:  Review of Systems  Constitutional: Negative for chills, fever and weight loss.  HENT: Negative for nosebleeds and sore throat.   Eyes: Negative for blurred vision.  Respiratory: Negative for cough, shortness of breath and wheezing.   Cardiovascular: Negative for chest pain, orthopnea, leg swelling and PND.  Gastrointestinal: Positive for abdominal pain. Negative for constipation, diarrhea, heartburn, nausea and vomiting.  Genitourinary: Negative for dysuria and urgency.  Musculoskeletal: Negative for back pain.  Skin: Negative for rash.  Neurological: Negative for dizziness, speech change, focal weakness and headaches.  Endo/Heme/Allergies: Does not bruise/bleed easily.  Psychiatric/Behavioral: Negative for depression.   DRUG ALLERGIES:   Allergies  Allergen Reactions  . Penicillin G Hives    AGE 83 yo PCN allergy  . Sulfa Antibiotics Other (See Comments) and Anaphylaxis  . Sulfamethoxazole-Trimethoprim Itching, Palpitations, Other (See Comments), Rash and Swelling    Shaking, redness, sneezing  . Articaine-Epinephrine Other (See Comments)  . Darvon [Propoxyphene] Other (See Comments)    Altered mental status  . Erythromycin Other (See Comments)  . Guaifenesin Other (See Comments)    Insomnia and anxiety  . Levofloxacin Other (See Comments)    750mg  dosage after 24 hours caused confusion, off-balanced CNS  . Metronidazole Nausea Only    Gi upset  . Other Other (See Comments)    septocaine caused numbness  . Penicillins Swelling  . Statins Other (See Comments)  . Tetracycline Other (See Comments)  . Erythromycin Base Rash    GI upset  . Macrobid [Nitrofurantoin] Rash  .  Spinach Palpitations    Irregular heartbeat  . Tape Rash  . Tetracyclines & Related Rash   VITALS:  Blood pressure 136/74, pulse (!) 55, temperature 98.4 F (36.9 C), temperature source Oral, resp. rate 17, height 5\' 2"  (1.575 m), weight 59.1 kg (130 lb 4.8 oz), SpO2 99 %. PHYSICAL EXAMINATION:  Physical Exam  Constitutional: She is oriented to person, place, and time and well-developed, well-nourished, and in no distress.  HENT:  Head: Normocephalic and atraumatic.  Eyes: Conjunctivae and EOM are normal. Pupils are equal, round, and reactive to light.  Neck: Normal range of motion. Neck supple. No tracheal deviation present. No thyromegaly present.  Cardiovascular: Normal rate, regular rhythm and normal heart sounds.   Pulmonary/Chest: Effort normal and breath sounds normal. No respiratory distress. She has no wheezes. She exhibits no tenderness.  Abdominal: Soft. Bowel sounds are normal. She exhibits no distension. There is tenderness in the left lower quadrant.  Musculoskeletal: Normal range of motion.  Neurological: She is alert and oriented to person, place, and time. No cranial nerve deficit.  Skin: Skin is warm and dry. No rash noted.  Psychiatric: Mood and affect normal.   LABORATORY PANEL:   CBC  Recent Labs Lab 04/03/16 0452  WBC 6.4  HGB 11.2*  HCT 33.0*  PLT 244   ------------------------------------------------------------------------------------------------------------------ Chemistries   Recent Labs Lab 04/03/16 0452  NA 135  K 3.6  CL 104  CO2 23  GLUCOSE 97  BUN 11  CREATININE 0.55  CALCIUM 8.4*   RADIOLOGY:  No results found. ASSESSMENT AND PLAN:   1. Acute diverticulitis with abscess -  continue IV Levaquin and Flagyl. - General surgery Recommends conservative management. Continue CLD - may consider advancing to FLD - Appreciate GI input. 2. Essential hypertension continue metoprolol and ramipril 3. History of systolic congestive heart  failure. No signs of heart failure on this stay so far. 4. Frequent urinary tract infections: UA looks normal.  There is no UTI at this time 5. Glaucoma continue eyedrops 6. History of chronic left bundle branch block    All the records are reviewed and case discussed with Care Management/Social Worker. Management plans discussed with the patient, family and they are in agreement.  CODE STATUS: FULL CODE  TOTAL TIME TAKING CARE OF THIS PATIENT: 35 minutes.   More than 50% of the time was spent in counseling/coordination of care: YES  POSSIBLE D/C IN 1-2 DAYS, DEPENDING ON CLINICAL CONDITION.  And surgical/gi evaluation   Max Sane M.D on 04/04/2016 at 1:45 PM  Between 7am to 6pm - Pager - [phone]  After 6pm go to [email or web address] - Proofreader  [facility]  Office  [phone]  CC: Primary care physician; Kirk Ruths., MD  Note: This dictation was prepared with Dragon dictation along with smaller phrase technology. Any transcriptional errors that result from this process are unintentional.

## 2016-04-04 NOTE — Progress Notes (Signed)
CC: diverticular abscess Subjective: Some intermittent pain, no emesis, decrease appetite  Objective: Vital signs in last 24 hours: Temp:  [97.5 F (36.4 C)-98.4 F (36.9 C)] 98.4 F (36.9 C) (01/10 0804) Pulse Rate:  [55-63] 55 (01/10 0804) Resp:  [16-20] 17 (01/10 0804) BP: (136-157)/(56-74) 136/74 (01/10 0804) SpO2:  [99 %-100 %] 99 % (01/10 0804) Last BM Date: 04/03/16  Intake/Output from previous day: 01/09 0701 - 01/10 0700 In: 1689 [P.O.:720; I.V.:719; IV Piggyback:250] Out: 4001 [Urine:4000; Stool:1] Intake/Output this shift: Total I/O In: 750 [P.O.:750] Out: 800 [Urine:800]  Physical exam: NAD Abd: soft, mild TTP LLQ, no peritonitis Ext: no edema, well perfused Lab Results: CBC   Recent Labs  04/02/16 0952 04/03/16 0452  WBC 8.4 6.4  HGB 12.7 11.2*  HCT 37.6 33.0*  PLT 272 244   BMET  Recent Labs  04/02/16 0952 04/03/16 0452  NA 138 135  K 4.1 3.6  CL 105 104  CO2 27 23  GLUCOSE 94 97  BUN 13 11  CREATININE 0.71 0.55  CALCIUM 9.2 8.4*   PT/INR No results for input(s): LABPROT, INR in the last 72 hours. ABG No results for input(s): PHART, HCO3 in the last 72 hours.  Invalid input(s): PCO2, PO2  Studies/Results: No results found.  Anti-infectives: Anti-infectives    Start     Dose/Rate Route Frequency Ordered Stop   04/03/16 1400  Levofloxacin (LEVAQUIN) IVPB 250 mg     250 mg 50 mL/hr over 60 Minutes Intravenous Every 24 hours 04/02/16 1351     04/02/16 1430  levofloxacin (LEVAQUIN) IVPB 500 mg     500 mg 100 mL/hr over 60 Minutes Intravenous  Once 04/02/16 1351 04/02/16 1938   04/02/16 1400  metroNIDAZOLE (FLAGYL) IVPB 500 mg     500 mg 100 mL/hr over 60 Minutes Intravenous Every 8 hours 04/02/16 1351        Assessment/Plan: Diverticular abscess, slowly improving Keep on clears only and IV A/Bs No immediate surgical intervention At some point will benefit from elective sigmoid colectomy after a colonoscopy is completed, d/w  pt in detail  Caroleen Hamman, MD, FACS  04/04/2016

## 2016-04-04 NOTE — Consult Note (Signed)
Jonathon Bellows MD  [address] [place], [place] [address] Phone: [phone] Fax : [phone]  Consultation  Referring Provider:     Marval Regal, NP Primary Care Physician:  Kirk Ruths., MD Primary Gastroenterologist:  Dr. Tiffany Kocher as outpatient          Reason for Consultation:     Diverticulitis  Date of Admission:  04/02/2016 Date of Consultation:  04/04/2016         HPI:   Maria [name] is a 83 y.o. female presented to the outpatient at [facility] clinic with abdominal pain . She has had two prior episodes of diverticulitis. Ct abdomen showed recurrent sigmoid diverticulitis with prediverticular abscess. No free air seen. She has been seen by surgery and suggested treatment with antibiotics. The patient had requested for a GI opinion.   She says that for 4 days prior to admission had LLQ abdominal pain followed by some chills and fevers which took her to Dr Dillard [place] office, had a CT scan and was admitted. Last colonoscopy was 11 years back.   Today feels better still some pain but much improved,no chills.   Past Medical History:  Diagnosis Date  . Arthritis    fingers, knees  . CHF (congestive heart failure) ([place])   . Complication of anesthesia    Pt reports "coded" during spinal injection during back surgery - 8 yrs ago - [place], [place]  . Environmental and seasonal allergies   . Hypercholesteremia   . Hypertension   . Left arm weakness    positional  . Left bundle branch block (LBBB)   . Motion sickness    seasick  . Seizures ([place]) 06/2013   reaction to exposure to allergan (dogs and cats)  . Vasovagal episode     Past Surgical History:  Procedure Laterality Date  . BACK SURGERY     multiple  . CATARACT EXTRACTION W/ INTRAOCULAR LENS  IMPLANT, BILATERAL    . JOINT REPLACEMENT     knee  . MASS EXCISION Left 12/15/2014   Procedure: MINOR EXCISION OF MASS LEFT INCLUSION CYST;  Surgeon: Beverly Gust, MD;  Location: [facility];  Service: ENT;   Laterality: Left;  NEEDS 2 NURSE  . ROTATOR CUFF REPAIR Bilateral     Prior to Admission medications   Medication Sig Start Date End Date Taking? Authorizing Provider  acetaminophen (TYLENOL) 500 MG tablet Take 1,000 mg by mouth at bedtime as needed for moderate pain.    Yes Historical Provider, MD  conjugated estrogens (PREMARIN) vaginal cream Place 1 Applicatorful vaginally daily. Use pea sized amount M-W-Fr before bedtime 01/11/16  Yes Hollice Espy, MD  latanoprost (XALATAN) 0.005 % ophthalmic solution Place 1 drop into both eyes at bedtime.    Yes Historical Provider, MD  metoprolol succinate (TOPROL-XL) 25 MG 24 hr tablet Take 0.5 tablets (12.5 mg total) by mouth daily. 12/14/15  Yes Vaughan Basta, MD  ramipril (ALTACE) 10 MG capsule Take 10 mg by mouth 2 (two) times daily.    Yes Historical Provider, MD  timolol (TIMOPTIC) 0.5 % ophthalmic solution Place 1 drop into both eyes every morning.   Yes Historical Provider, MD    Family History  Problem Relation Age of Onset  . Kidney Stones Mother   . Kidney disease Neg Hx      Social History  Substance Use Topics  . Smoking status: Never Smoker  . Smokeless tobacco: Never Used  . Alcohol use No    Allergies as of 04/02/2016 - Review  Complete 04/02/2016  Allergen Reaction Noted  . Penicillin g Hives 10/10/2015  . Sulfa antibiotics Other (See Comments) and Anaphylaxis 04/30/2014  . Sulfamethoxazole-trimethoprim Itching, Palpitations, Other (See Comments), Rash, and Swelling 10/11/2015  . Articaine-epinephrine Other (See Comments) 04/30/2014  . Darvon [propoxyphene] Other (See Comments) 04/30/2014  . Erythromycin Other (See Comments) 04/30/2014  . Guaifenesin Other (See Comments) 03/04/2015  . Levofloxacin Other (See Comments) 10/12/2015  . Metronidazole Nausea Only 12/10/2014  . Other Other (See Comments) 12/10/2014  . Penicillins Swelling 12/10/2014  . Statins Other (See Comments) 03/04/2015  . Tetracycline Other  (See Comments) 04/30/2014  . Erythromycin base Rash 12/10/2014  . Macrobid [nitrofurantoin] Rash 12/10/2014  . Spinach Palpitations 12/10/2014  . Tape Rash 12/10/2014  . Tetracyclines & related Rash 12/10/2014    Review of Systems:    All systems reviewed and negative except where noted in HPI.   Physical Exam:  Vital signs in last 24 hours: Temp:  [97.5 F (36.4 C)-98.4 F (36.9 C)] 98.4 F (36.9 C) (01/10 0804) Pulse Rate:  [55-63] 55 (01/10 0804) Resp:  [16-20] 17 (01/10 0804) BP: (124-157)/(56-85) 136/74 (01/10 0804) SpO2:  [99 %-100 %] 99 % (01/10 0804) Last BM Date: 04/03/16 General:   Pleasant, cooperative in NAD Head:  Normocephalic and atraumatic. Eyes:   No icterus.   Conjunctiva pink. PERRLA. Ears:  Normal auditory acuity. Neck:  Supple; no masses or thyroidomegaly Lungs: Respirations even and unlabored. Lungs clear to auscultation bilaterally.   No wheezes, crackles, or rhonchi.  Heart:  Regular rate and rhythm;  Without murmur, clicks, rubs or gallops Abdomen:  Soft, nondistended, nontender. Normal bowel sounds. No appreciable masses or hepatomegaly.  No rebound or guarding.  Rectal:  Not performed. Extremities:  Without edema, cyanosis or clubbing. Neurologic:  Alert and oriented x3;  grossly normal neurologically. Psych:  Alert and cooperative. Normal affect.  LAB RESULTS:  Recent Labs  04/02/16 0952 04/03/16 0452  WBC 8.4 6.4  HGB 12.7 11.2*  HCT 37.6 33.0*  PLT 272 244   BMET  Recent Labs  04/02/16 0952 04/03/16 0452  NA 138 135  K 4.1 3.6  CL 105 104  CO2 27 23  GLUCOSE 94 97  BUN 13 11  CREATININE 0.71 0.55  CALCIUM 9.2 8.4*   LFT No results for input(s): PROT, ALBUMIN, AST, ALT, ALKPHOS, BILITOT, BILIDIR, IBILI in the last 72 hours. PT/INR No results for input(s): LABPROT, INR in the last 72 hours.  STUDIES: Ct Abdomen Pelvis W Contrast  Result Date: 04/02/2016 CLINICAL DATA:  Left lower quadrant abdominal pain. History of  diverticulitis, sepsis, urinary tract infection and back surgery. EXAM: CT ABDOMEN AND PELVIS WITH CONTRAST TECHNIQUE: Multidetector CT imaging of the abdomen and pelvis was performed using the standard protocol following bolus administration of intravenous contrast. CONTRAST:  111mL ISOVUE-300 IOPAMIDOL (ISOVUE-300) INJECTION 61% COMPARISON:  Noncontrast CT 12/11/2015. Contrast-enhanced CT 10/11/2015. FINDINGS: Lower chest: Clear lung bases. No significant pleural or pericardial effusion. Hepatobiliary: The liver is normal in density without focal abnormality. No evidence of gallstones, gallbladder wall thickening or biliary dilatation. Pancreas: Unremarkable. No pancreatic ductal dilatation or surrounding inflammatory changes. Spleen: Normal in size without focal abnormality. Adrenals/Urinary Tract: Both adrenal glands appear normal. Bilateral renal cysts are grossly stable, largest in the lower pole of the left kidney, measuring 4.8 cm on image 34. The perinephric soft tissue stranding and mild collecting system dilatation on the left noted previously have resolved. There is no evidence urinary tract calculus or hydronephrosis. The  bladder appears unremarkable. Stomach/Bowel: The stomach, small bowel, appendix and proximal colon demonstrate no significant findings. There are progressive diverticular changes throughout the descending and sigmoid colon associated with new long segment wall thickening of the sigmoid colon. There is a complex fluid collection superior to the sigmoid colon measuring 1.9 x 2.6 cm on image 55 (and 2.4 cm on coronal image 49), consistent with a peridiverticular abscess. No other extraluminal fluid collections are identified. There is no extravasated enteric contrast, bowel obstruction or free air. Vascular/Lymphatic: Prominent retroperitoneal lymph nodes are similar to previous studies, likely reactive. Diffuse aortic and branch vessel atherosclerosis. No evidence of venous occlusion.  Reproductive: The peridiverticular abscess associated with sigmoid colon is situated between the uterus and left ovary. There is increased heterogeneity of the uterine fundus, but no gas in the endometrial canal. No adnexal mass. Other: No evidence of abdominal wall mass or hernia. No ascites. Musculoskeletal: No acute osseous findings. There are degenerative and postsurgical changes throughout the lumbar spine. IMPRESSION: 1. Recurrent sigmoid diverticulitis with associated peridiverticular abscess. This abuts the uterus, although there is no definite fistula. The bladder appears stable. 2. No evidence of bowel obstruction or other extraluminal fluid collection. 3. Atherosclerosis, as before. 4. These results will be called to the ordering clinician or representative by the Radiologist Assistant, and communication documented in the PACS or zVision Dashboard. Electronically Signed   By: Richardean Sale M.D.   On: 04/02/2016 11:47      Impression / Plan:   Maria [name] is a 83 y.o. y/o female has been admitted with the third episode of sigmoid diverticulitis with a prediverticular abscess. She is being followed by surgery and antibiotics have been started.  The patient requested for a GI consult. Presently she appears to be improving clinically. She has not had a recent colonoscopy and hence would recommend one with Dr Tiffany Kocher in 8-10 weeks once we are confident her diverticulitis and abscess has resolved. May need additional imaging prior to colonoscopy . I believe surgery for sigmoid resection is also being planned . At present continue with antibiotics and conservative management .  I will sign off.  Please call me if any further GI concerns or questions.  We would like to thank you for the opportunity to participate in the care of Maria [name].   Thank you for involving me in the care of this patient.      LOS: 2 days   Jonathon Bellows, MD  04/04/2016, 8:44 AM

## 2016-04-04 NOTE — Plan of Care (Signed)
Problem: Nutrition: Goal: Adequate nutrition will be maintained Outcome: Progressing The need for a clear liquid die was reinforced with the patient

## 2016-04-05 LAB — BASIC METABOLIC PANEL
ANION GAP: 6 (ref 5–15)
BUN: 7 mg/dL (ref 6–20)
CO2: 27 mmol/L (ref 22–32)
Calcium: 8.8 mg/dL — ABNORMAL LOW (ref 8.9–10.3)
Chloride: 106 mmol/L (ref 101–111)
Creatinine, Ser: 0.62 mg/dL (ref 0.44–1.00)
GFR calc Af Amer: >60 mL/min (ref >60)
GFR calc non Af Amer: >60 mL/min (ref >60)
GLUCOSE: 97 mg/dL (ref 65–99)
POTASSIUM: 3.1 mmol/L — AB (ref 3.5–5.1)
Sodium: 139 mmol/L (ref 135–145)

## 2016-04-05 LAB — CBC
HEMATOCRIT: 37.8 % (ref 35.0–47.0)
Hemoglobin: 12.8 g/dL (ref 12.0–16.0)
MCH: 30.1 pg (ref 26.0–34.0)
MCHC: 33.8 g/dL (ref 32.0–36.0)
MCV: 89.1 fL (ref 80.0–100.0)
Platelets: 311 10*3/uL (ref 150–440)
RBC: 4.24 MIL/uL (ref 3.80–5.20)
RDW: 13 % (ref 11.5–14.5)
WBC: 4.7 10*3/uL (ref 3.6–11.0)

## 2016-04-05 MED ORDER — LEVOFLOXACIN 250 MG PO TABS
250.0000 mg | ORAL_TABLET | ORAL | Status: AC
  Administered 2016-04-05: 250 mg via ORAL
  Filled 2016-04-05: qty 1

## 2016-04-05 MED ORDER — CIPROFLOXACIN HCL 500 MG PO TABS
500.0000 mg | ORAL_TABLET | Freq: Two times a day (BID) | ORAL | 0 refills | Status: DC
Start: 2016-04-05 — End: 2017-03-13

## 2016-04-05 MED ORDER — METRONIDAZOLE 250 MG PO TABS: 250.0000 mg | ORAL_TABLET | Freq: Three times a day (TID) | ORAL | 0 refills | Status: DC

## 2016-04-05 MED ORDER — POTASSIUM CHLORIDE CRYS ER 20 MEQ PO TBCR
40.0000 meq | EXTENDED_RELEASE_TABLET | Freq: Once | ORAL | Status: AC
  Administered 2016-04-05: 40 meq via ORAL
  Filled 2016-04-05: qty 2

## 2016-04-05 MED ORDER — METRONIDAZOLE 500 MG PO TABS
500.0000 mg | ORAL_TABLET | ORAL | Status: AC
  Administered 2016-04-05: 500 mg via ORAL
  Filled 2016-04-05: qty 1

## 2016-04-05 NOTE — Discharge Instructions (Signed)
Diverticulosis  Diverticulosis is the condition that develops when small pouches (diverticula) form in the wall of your colon. Your colon, or large intestine, is where water is absorbed and stool is formed. The pouches form when the inside layer of your colon pushes through weak spots in the outer layers of your colon.  CAUSES   No one knows exactly what causes diverticulosis.  RISK FACTORS  · Being older than 50. Your risk for this condition increases with age. Diverticulosis is rare in people younger than 40 years. By age 80, almost everyone has it.  · Eating a low-fiber diet.  · Being frequently constipated.  · Being overweight.  · Not getting enough exercise.  · Smoking.  · Taking over-the-counter pain medicines, like aspirin and ibuprofen.  SYMPTOMS   Most people with diverticulosis do not have symptoms.  DIAGNOSIS   Because diverticulosis often has no symptoms, health care providers often discover the condition during an exam for other colon problems. In many cases, a health care provider will diagnose diverticulosis while using a flexible scope to examine the colon (colonoscopy).  TREATMENT   If you have never developed an infection related to diverticulosis, you may not need treatment. If you have had an infection before, treatment may include:  · Eating more fruits, vegetables, and grains.  · Taking a fiber supplement.  · Taking a live bacteria supplement (probiotic).  · Taking medicine to relax your colon.  HOME CARE INSTRUCTIONS   · Drink at least 6-8 glasses of water each day to prevent constipation.  · Try not to strain when you have a bowel movement.  · Keep all follow-up appointments.  If you have had an infection before:   · Increase the fiber in your diet as directed by your health care provider or dietitian.  · Take a dietary fiber supplement if your health care provider approves.  · Only take medicines as directed by your health care provider.  SEEK MEDICAL CARE IF:   · You have abdominal pain.   · You have bloating.  · You have cramps.  · You have not gone to the bathroom in 3 days.  SEEK IMMEDIATE MEDICAL CARE IF:   · Your pain gets worse.  · Your bloating becomes very bad.  · You have a fever or chills, and your symptoms suddenly get worse.  · You begin vomiting.  · You have bowel movements that are bloody or black.  MAKE SURE YOU:  · Understand these instructions.  · Will watch your condition.  · Will get help right away if you are not doing well or get worse.     This information is not intended to replace advice given to you by your health care provider. Make sure you discuss any questions you have with your health care provider.     Document Released: 12/08/2003 Document Revised: 03/17/2013 Document Reviewed: 02/04/2013  Elsevier Interactive Patient Education ©2017 [employer]vier Inc[employer]

## 2016-04-05 NOTE — Progress Notes (Signed)
CC: diverticular abscess Subjective: Feeling much better, pain improving. Had BM and flatus Taking clears  Objective: Vital signs in last 24 hours: Temp:  [97.6 F (36.4 C)-98.1 F (36.7 C)] 97.6 F (36.4 C) (01/11 0852) Pulse Rate:  [51-66] 51 (01/11 0852) Resp:  [17-18] 18 (01/11 0852) BP: (129-170)/(52-74) 170/74 (01/11 0852) SpO2:  [98 %-100 %] 100 % (01/11 0852) Last BM Date: 04/04/16  Intake/Output from previous day: 01/10 0701 - 01/11 0700 In: 2458.5 [P.O.:1500; I.V.:608.5; IV Piggyback:350] Out: 2452 [Urine:2450; Stool:2] Intake/Output this shift: Total I/O In: 905 [P.O.:240; I.V.:665] Out: 0   Physical exam: NAD , alert Abd: soft, Nt, no peritonitis Ext: no edema, well perfused   Lab Results: CBC   Recent Labs  04/03/16 0452 04/05/16 0730  WBC 6.4 4.7  HGB 11.2* 12.8  HCT 33.0* 37.8  PLT 244 311   BMET  Recent Labs  04/03/16 0452 04/05/16 0730  NA 135 139  K 3.6 3.1*  CL 104 106  CO2 23 27  GLUCOSE 97 97  BUN 11 7  CREATININE 0.55 0.62  CALCIUM 8.4* 8.8*   PT/INR No results for input(s): LABPROT, INR in the last 72 hours. ABG No results for input(s): PHART, HCO3 in the last 72 hours.  Invalid input(s): PCO2, PO2  Studies/Results: No results found.  Anti-infectives: Anti-infectives    Start     Dose/Rate Route Frequency Ordered Stop   04/03/16 1400  Levofloxacin (LEVAQUIN) IVPB 250 mg     250 mg 50 mL/hr over 60 Minutes Intravenous Every 24 hours 04/02/16 1351     04/02/16 1430  levofloxacin (LEVAQUIN) IVPB 500 mg     500 mg 100 mL/hr over 60 Minutes Intravenous  Once 04/02/16 1351 04/02/16 1938   04/02/16 1400  metroNIDAZOLE (FLAGYL) IVPB 500 mg     500 mg 100 mL/hr over 60 Minutes Intravenous Every 8 hours 04/02/16 1351        Assessment/Plan: Diverticular abscess responsive to A/bs advance diet Mobilize May transition to Po A/bs No need for immediate surgical intervention  Caroleen Hamman, MD,  FACS  04/05/2016

## 2016-04-05 NOTE — Progress Notes (Signed)
Pt A and O x 4. VSS. Pt tolerating diet well. No complaints of pain or nausea. IV removed intact, prescriptions given. Pt voiced understanding of discharge instructions with no further questions.  Pt pushed downstairs by RN and valet to assist pt getting to her car.

## 2016-04-05 NOTE — Care Management Important Message (Signed)
Important Message  Patient Details  Name: Maria [name] MRN: [id number] Date of Birth: Mar 13, 1931   Medicare Important Message Given:  Yes    Beverly Sessions, RN 04/05/2016, 2:51 PM

## 2016-04-06 NOTE — Discharge Summary (Signed)
[facility] NAME: Maria [name]    MR#:  [id number]  DATE OF BIRTH:  1930-08-14  DATE OF ADMISSION:  04/02/2016   ADMITTING PHYSICIAN: Loletha Grayer, MD  DATE OF DISCHARGE: 04/05/2016  4:34 PM  PRIMARY CARE PHYSICIAN: Kirk Ruths., MD   ADMISSION DIAGNOSIS:  Diverticulititis with abscess DISCHARGE DIAGNOSIS:  Active Problems:   Colonic diverticular abscess  SECONDARY DIAGNOSIS:   Past Medical History:  Diagnosis Date  . Arthritis    fingers, knees  . CHF (congestive heart failure) ([place])   . Complication of anesthesia    Pt reports "coded" during spinal injection during back surgery - 8 yrs ago - [place], [place]  . Environmental and seasonal allergies   . Hypercholesteremia   . Hypertension   . Left arm weakness    positional  . Left bundle branch block (LBBB)   . Motion sickness    seasick  . Seizures ([place]) 06/2013   reaction to exposure to allergan (dogs and cats)  . Vasovagal episode    HOSPITAL COURSE:  83 y.o. female with a known history of Diverticulitis in the past. She was a direct admission after not feeling well.  1. Acute diverticulitis with abscess - Improved with conservative mgmt. tolerating diet. GI & Surgery seen and agreeable with mgmt 2. Essential hypertension continue metoprolol and ramipril 3. History of systolic congestive heart failure. No signs of heart failure on this stay so far. 4. Frequent urinary tract infections: UA looks normal.  There is no UTI at this time 5. Glaucoma continue eyedrops 6. History of chronic left bundle branch block  DISCHARGE CONDITIONS:  STABLE CONSULTS OBTAINED:  Treatment Team:  Jules Husbands, MD DRUG ALLERGIES:   Allergies  Allergen Reactions  . Penicillin G Hives    AGE 60 yo PCN allergy  . Sulfa Antibiotics Other (See Comments) and Anaphylaxis  . Sulfamethoxazole-Trimethoprim Itching, Palpitations, Other (See Comments), Rash and Swelling   Shaking, redness, sneezing  . Articaine-Epinephrine Other (See Comments)  . Darvon [Propoxyphene] Other (See Comments)    Altered mental status  . Erythromycin Other (See Comments)  . Guaifenesin Other (See Comments)    Insomnia and anxiety  . Levofloxacin Other (See Comments)    750mg  dosage after 24 hours caused confusion, off-balanced CNS  . Metronidazole Nausea Only    Gi upset  . Other Other (See Comments)    septocaine caused numbness  . Penicillins Swelling  . Statins Other (See Comments)  . Tetracycline Other (See Comments)  . Erythromycin Base Rash    GI upset  . Macrobid [Nitrofurantoin] Rash  . Spinach Palpitations    Irregular heartbeat  . Tape Rash  . Tetracyclines & Related Rash   DISCHARGE MEDICATIONS:   Allergies as of 04/05/2016      Reactions   Penicillin G Hives   AGE 8 yo PCN allergy   Sulfa Antibiotics Other (See Comments), Anaphylaxis   Sulfamethoxazole-trimethoprim Itching, Palpitations, Other (See Comments), Rash, Swelling   Shaking, redness, sneezing   Articaine-epinephrine Other (See Comments)   Darvon [propoxyphene] Other (See Comments)   Altered mental status   Erythromycin Other (See Comments)   Guaifenesin Other (See Comments)   Insomnia and anxiety   Levofloxacin Other (See Comments)   750mg  dosage after 24 hours caused confusion, off-balanced CNS   Metronidazole Nausea Only   Gi upset   Other Other (See Comments)   septocaine caused numbness   Penicillins Swelling  Statins Other (See Comments)   Tetracycline Other (See Comments)   Erythromycin Base Rash   GI upset   Macrobid [nitrofurantoin] Rash   Spinach Palpitations   Irregular heartbeat   Tape Rash   Tetracyclines & Related Rash      Medication List    TAKE these medications   acetaminophen 500 MG tablet Commonly known as:  TYLENOL Take 1,000 mg by mouth at bedtime as needed for moderate pain.   ciprofloxacin 500 MG tablet Commonly known as:  CIPRO Take 1 tablet  (500 mg total) by mouth 2 (two) times daily.   conjugated estrogens vaginal cream Commonly known as:  PREMARIN Place 1 Applicatorful vaginally daily. Use pea sized amount M-W-Fr before bedtime   latanoprost 0.005 % ophthalmic solution Commonly known as:  XALATAN Place 1 drop into both eyes at bedtime.   metoprolol succinate 25 MG 24 hr tablet Commonly known as:  TOPROL-XL Take 0.5 tablets (12.5 mg total) by mouth daily.   metroNIDAZOLE 250 MG tablet Commonly known as:  FLAGYL Take 1 tablet (250 mg total) by mouth 3 (three) times daily.   ramipril 10 MG capsule Commonly known as:  ALTACE Take 10 mg by mouth 2 (two) times daily.   timolol 0.5 % ophthalmic solution Commonly known as:  TIMOPTIC Place 1 drop into both eyes every morning.        DISCHARGE INSTRUCTIONS:   DIET:  Regular diet DISCHARGE CONDITION:  Good ACTIVITY:  Activity as tolerated OXYGEN:  Home Oxygen: No.  Oxygen Delivery: room air DISCHARGE LOCATION:  home   If you experience worsening of your admission symptoms, develop shortness of breath, life threatening emergency, suicidal or homicidal thoughts you must seek medical attention immediately by calling 911 or calling your MD immediately  if symptoms less severe.  You Must read complete instructions/literature along with all the possible adverse reactions/side effects for all the Medicines you take and that have been prescribed to you. Take any new Medicines after you have completely understood and accpet all the possible adverse reactions/side effects.   Please note  You were cared for by a hospitalist during your hospital stay. If you have any questions about your discharge medications or the care you received while you were in the hospital after you are discharged, you can call the unit and asked to speak with the hospitalist on call if the hospitalist that took care of you is not available. Once you are discharged, your primary care physician will  handle any further medical issues. Please note that NO REFILLS for any discharge medications will be authorized once you are discharged, as it is imperative that you return to your primary care physician (or establish a relationship with a primary care physician if you do not have one) for your aftercare needs so that they can reassess your need for medications and monitor your lab values.    On the day of Discharge:  VITAL SIGNS:  Blood pressure (!) 152/73, pulse (!) 59, temperature 98.7 F (37.1 C), temperature source Oral, resp. rate 18, height 5\' 2"  (1.575 m), weight 59.1 kg (130 lb 4.8 oz), SpO2 99 %. PHYSICAL EXAMINATION:  GENERAL:  83 y.o.-year-old patient lying in the bed with no acute distress.  EYES: Pupils equal, round, reactive to light and accommodation. No scleral icterus. Extraocular muscles intact.  HEENT: Head atraumatic, normocephalic. Oropharynx and nasopharynx clear.  NECK:  Supple, no jugular venous distention. No thyroid enlargement, no tenderness.  LUNGS: Normal breath sounds bilaterally, no  wheezing, rales,rhonchi or crepitation. No use of accessory muscles of respiration.  CARDIOVASCULAR: S1, S2 normal. No murmurs, rubs, or gallops.  ABDOMEN: Soft, non-tender, non-distended. Bowel sounds present. No organomegaly or mass.  EXTREMITIES: No pedal edema, cyanosis, or clubbing.  NEUROLOGIC: Cranial nerves II through XII are intact. Muscle strength 5/5 in all extremities. Sensation intact. Gait not checked.  PSYCHIATRIC: The patient is alert and oriented x 3.  SKIN: No obvious rash, lesion, or ulcer.  DATA REVIEW:   CBC  Recent Labs Lab 04/05/16 0730  WBC 4.7  HGB 12.8  HCT 37.8  PLT 311    Chemistries   Recent Labs Lab 04/05/16 0730  NA 139  K 3.1*  CL 106  CO2 27  GLUCOSE 97  BUN 7  CREATININE 0.62  CALCIUM 8.8*    Follow-up Information    Kirk Ruths., MD. Go on 04/13/2016.   Specialty:  Internal Medicine Why:  Friday, 1/19 at 9:15 a.m.  [phone] Contact information: [place] [address] [id number]        Gaylyn Cheers, MD. Go on 04/09/2016.   Specialty:  Gastroenterology Why:  Monday, 1/15 at 9:30 a.m. with Tammi Klippel, PA [phone] Contact information: [place] [address] [phone]        Jules Husbands, MD. Go on 04/13/2016.   Specialty:  General Surgery Why:  Friday, Jan. 19 at 9:30 a.m. [phone] Contact information: [place] [address] [phone]           Management plans discussed with the patient, family and they are in agreement.  CODE STATUS: DNR   TOTAL TIME TAKING CARE OF THIS PATIENT: 45 minutes.    Max Sane M.D on 04/06/2016 at 3:12 PM  Between 7am to 6pm - Pager - [phone]  After 6pm go to [email or web address] - Proofreader  [facility]  Office  [phone]  CC: Primary care physician; Kirk Ruths., MD   Note: This dictation was prepared with Dragon dictation along with smaller phrase technology. Any transcriptional errors that result from this process are unintentional.

## 2016-04-11 ENCOUNTER — Other Ambulatory Visit: Payer: Self-pay

## 2016-04-13 ENCOUNTER — Telehealth: Payer: Self-pay

## 2016-04-13 ENCOUNTER — Ambulatory Visit (INDEPENDENT_AMBULATORY_CARE_PROVIDER_SITE_OTHER): Payer: Medicare Other | Admitting: Surgery

## 2016-04-13 ENCOUNTER — Ambulatory Visit: Payer: Medicare Other | Admitting: Surgery

## 2016-04-13 ENCOUNTER — Encounter: Payer: Self-pay | Admitting: Surgery

## 2016-04-13 VITALS — BP 170/70 | HR 60 | Temp 97.4°F | Ht 62.0 in | Wt 123.6 lb

## 2016-04-13 DIAGNOSIS — K5732 Diverticulitis of large intestine without perforation or abscess without bleeding: Secondary | ICD-10-CM

## 2016-04-13 NOTE — Telephone Encounter (Signed)
Spoke with patient at this time. Her Cardiology appointment with [facility] is 04/20/16 @ 11:15 am.  Patient verbalized understanding.

## 2016-04-13 NOTE — Progress Notes (Signed)
F/U complicated diverticulitis. Finishing up her quinolone and flagyl today Having BM, no fevers or chills No abd pain  PE NAD Abd: soft, NT, no peritonitis  A/p complicated diverticulitis responsive to A/Bs D/w pt in detail about potential sigmoid colectomy ( elective) He first need to see cardiology for optimization due to her LBBB and Mild CHF ( clinically she is very active and is able to perform more than 4 METS) She does have f/u appt w GI for colonoscopy late march No need for surgical intervention. D/W her about possible colectomy, risks, benefits and possible complications. I did encourage her to think about this decision. I spent at least 25 min in this encounter w the majority of time allocated to counseling.

## 2016-04-13 NOTE — Patient Instructions (Signed)
We would like for you to keep your Colonoscopy appointment with [facility].  We would like for you to see your Cardiologist at [facility]. We will make this appointment for you and call you later today or on Monday with that appointment information. We would like for you to follow up with Dr.Pabon in May. Please see the appointment listed below. Please call our office if you have any questions or concerns.

## 2016-06-26 ENCOUNTER — Encounter: Payer: Self-pay | Admitting: *Deleted

## 2016-06-27 ENCOUNTER — Ambulatory Visit: Payer: Medicare Other | Admitting: Anesthesiology

## 2016-06-27 ENCOUNTER — Ambulatory Visit
Admission: RE | Admit: 2016-06-27 | Discharge: 2016-06-27 | Disposition: A | Discharge status: Home / Self Care | Payer: Medicare Other | Source: Ambulatory Visit | Attending: Unknown Physician Specialty | Admitting: Unknown Physician Specialty

## 2016-06-27 ENCOUNTER — Encounter
Admission: RE | Disposition: A | Discharge status: Home / Self Care | Payer: Self-pay | Source: Ambulatory Visit | Attending: Unknown Physician Specialty

## 2016-06-27 ENCOUNTER — Encounter: Payer: Self-pay | Admitting: *Deleted

## 2016-06-27 DIAGNOSIS — Z79899 Other long term (current) drug therapy: Secondary | ICD-10-CM | POA: Insufficient documentation

## 2016-06-27 DIAGNOSIS — R569 Unspecified convulsions: Secondary | ICD-10-CM | POA: Diagnosis not present

## 2016-06-27 DIAGNOSIS — Z8041 Family history of malignant neoplasm of ovary: Secondary | ICD-10-CM | POA: Insufficient documentation

## 2016-06-27 DIAGNOSIS — R531 Weakness: Secondary | ICD-10-CM | POA: Diagnosis not present

## 2016-06-27 DIAGNOSIS — I509 Heart failure, unspecified: Secondary | ICD-10-CM | POA: Insufficient documentation

## 2016-06-27 DIAGNOSIS — Z9841 Cataract extraction status, right eye: Secondary | ICD-10-CM | POA: Insufficient documentation

## 2016-06-27 DIAGNOSIS — Z888 Allergy status to other drugs, medicaments and biological substances status: Secondary | ICD-10-CM | POA: Insufficient documentation

## 2016-06-27 DIAGNOSIS — M19049 Primary osteoarthritis, unspecified hand: Secondary | ICD-10-CM | POA: Insufficient documentation

## 2016-06-27 DIAGNOSIS — Z961 Presence of intraocular lens: Secondary | ICD-10-CM | POA: Insufficient documentation

## 2016-06-27 DIAGNOSIS — I447 Left bundle-branch block, unspecified: Secondary | ICD-10-CM | POA: Diagnosis not present

## 2016-06-27 DIAGNOSIS — K644 Residual hemorrhoidal skin tags: Secondary | ICD-10-CM | POA: Diagnosis not present

## 2016-06-27 DIAGNOSIS — E7800 Pure hypercholesterolemia, unspecified: Secondary | ICD-10-CM | POA: Diagnosis not present

## 2016-06-27 DIAGNOSIS — K640 First degree hemorrhoids: Secondary | ICD-10-CM | POA: Insufficient documentation

## 2016-06-27 DIAGNOSIS — Z820 Family history of epilepsy and other diseases of the nervous system: Secondary | ICD-10-CM | POA: Insufficient documentation

## 2016-06-27 DIAGNOSIS — R1032 Left lower quadrant pain: Secondary | ICD-10-CM | POA: Diagnosis present

## 2016-06-27 DIAGNOSIS — Z96659 Presence of unspecified artificial knee joint: Secondary | ICD-10-CM | POA: Diagnosis not present

## 2016-06-27 DIAGNOSIS — Z881 Allergy status to other antibiotic agents status: Secondary | ICD-10-CM | POA: Diagnosis not present

## 2016-06-27 DIAGNOSIS — Z882 Allergy status to sulfonamides status: Secondary | ICD-10-CM | POA: Diagnosis not present

## 2016-06-27 DIAGNOSIS — Z9109 Other allergy status, other than to drugs and biological substances: Secondary | ICD-10-CM | POA: Diagnosis not present

## 2016-06-27 DIAGNOSIS — Z880 Allergy status to penicillin: Secondary | ICD-10-CM | POA: Diagnosis not present

## 2016-06-27 DIAGNOSIS — K5730 Diverticulosis of large intestine without perforation or abscess without bleeding: Secondary | ICD-10-CM | POA: Insufficient documentation

## 2016-06-27 DIAGNOSIS — M170 Bilateral primary osteoarthritis of knee: Secondary | ICD-10-CM | POA: Insufficient documentation

## 2016-06-27 DIAGNOSIS — D120 Benign neoplasm of cecum: Secondary | ICD-10-CM | POA: Diagnosis not present

## 2016-06-27 DIAGNOSIS — Z91018 Allergy to other foods: Secondary | ICD-10-CM | POA: Diagnosis not present

## 2016-06-27 DIAGNOSIS — Z8249 Family history of ischemic heart disease and other diseases of the circulatory system: Secondary | ICD-10-CM | POA: Insufficient documentation

## 2016-06-27 DIAGNOSIS — Z9842 Cataract extraction status, left eye: Secondary | ICD-10-CM | POA: Insufficient documentation

## 2016-06-27 DIAGNOSIS — Z85820 Personal history of malignant melanoma of skin: Secondary | ICD-10-CM | POA: Diagnosis not present

## 2016-06-27 DIAGNOSIS — Z91048 Other nonmedicinal substance allergy status: Secondary | ICD-10-CM | POA: Insufficient documentation

## 2016-06-27 DIAGNOSIS — I110 Hypertensive heart disease with heart failure: Secondary | ICD-10-CM | POA: Diagnosis not present

## 2016-06-27 HISTORY — DX: Malignant (primary) neoplasm, unspecified: C80.1

## 2016-06-27 HISTORY — DX: Diverticulitis of intestine, part unspecified, without perforation or abscess without bleeding: K57.92

## 2016-06-27 HISTORY — PX: COLONOSCOPY WITH PROPOFOL: SHX5780

## 2016-06-27 SURGERY — COLONOSCOPY WITH PROPOFOL
Anesthesia: General

## 2016-06-27 MED ORDER — GLYCOPYRROLATE 0.2 MG/ML IV SOSY
PREFILLED_SYRINGE | INTRAVENOUS | Status: DC | PRN
  Administered 2016-06-27 (×2): .2 mg via INTRAVENOUS

## 2016-06-27 MED ORDER — DEXTROSE 5 % IV SOLN
Freq: Once | INTRAVENOUS | Status: AC
  Administered 2016-06-27: 09:00:00 via INTRAVENOUS
  Filled 2016-06-27: qty 50

## 2016-06-27 MED ORDER — GENTAMICIN IN SALINE 1.2-0.9 MG/ML-% IV SOLN: 60.0000 mg | Freq: Once | INTRAVENOUS | Status: DC

## 2016-06-27 MED ORDER — PROPOFOL 10 MG/ML IV BOLUS
INTRAVENOUS | Status: DC | PRN
  Administered 2016-06-27: 50 mg via INTRAVENOUS

## 2016-06-27 MED ORDER — SODIUM CHLORIDE 0.9 % IV SOLN: INTRAVENOUS | Status: DC

## 2016-06-27 MED ORDER — GLYCOPYRROLATE 0.2 MG/ML IJ SOLN
INTRAMUSCULAR | Status: AC
  Filled 2016-06-27: qty 1

## 2016-06-27 MED ORDER — SODIUM CHLORIDE 0.9 % IV SOLN
INTRAVENOUS | Status: DC
  Administered 2016-06-27: 1000 mL via INTRAVENOUS

## 2016-06-27 MED ORDER — PROPOFOL 500 MG/50ML IV EMUL
INTRAVENOUS | Status: AC
  Filled 2016-06-27: qty 50

## 2016-06-27 MED ORDER — PROPOFOL 500 MG/50ML IV EMUL
INTRAVENOUS | Status: DC | PRN
  Administered 2016-06-27: 140 ug/kg/min via INTRAVENOUS

## 2016-06-27 MED ORDER — PHENYLEPHRINE HCL 10 MG/ML IJ SOLN
INTRAMUSCULAR | Status: DC | PRN
  Administered 2016-06-27 (×2): 100 ug via INTRAVENOUS

## 2016-06-27 MED ORDER — PHENYLEPHRINE HCL 10 MG/ML IJ SOLN
INTRAMUSCULAR | Status: AC
  Filled 2016-06-27: qty 1

## 2016-06-27 MED ORDER — VANCOMYCIN HCL 500 MG IV SOLR
500.0000 mg | Freq: Once | INTRAVENOUS | Status: AC
  Administered 2016-06-27: 500 mg via INTRAVENOUS
  Filled 2016-06-27: qty 500

## 2016-06-27 NOTE — H&P (Signed)
Primary Care Physician:  Kirk Ruths., MD Primary Gastroenterologist:  Dr. Vira Agar  Pre-Procedure History & Physical: HPI:  Maria [name] is a 83 y.o. female is here for an colonoscopy.   Past Medical History:  Diagnosis Date  . Arthritis    fingers, knees  . Cancer ([place])    melanoma  . CHF (congestive heart failure) ([place])   . Complication of anesthesia    Pt reports "coded" during spinal injection during back surgery - 8 yrs ago - [place], [place]  . Diverticulitis   . Environmental and seasonal allergies   . Hypercholesteremia   . Hypertension   . Left arm weakness    positional  . Left bundle branch block (LBBB)   . Motion sickness    seasick  . Seizures ([place]) 06/2013   reaction to exposure to allergan (dogs and cats)  . Vasovagal episode     Past Surgical History:  Procedure Laterality Date  . BACK SURGERY     multiple  . CATARACT EXTRACTION W/ INTRAOCULAR LENS  IMPLANT, BILATERAL    . HAMMERTOE RECONSTRUCTION WITH WEIL OSTEOTOMY Left 08/29/2006  . JOINT REPLACEMENT  08/27/2014   knee  . KNEE ARTHROCENTESIS Left 04/26/2008  . LUMBAR LAMINECTOMY N/A 06/25/2015  . MASS EXCISION Left 12/15/2014   Procedure: MINOR EXCISION OF MASS LEFT INCLUSION CYST;  Surgeon: Beverly Gust, MD;  Location: [place];  Service: ENT;  Laterality: Left;  NEEDS 2 NURSE  . MELANOMA EXCISION Right 03/14/2016  . ROTATOR CUFF REPAIR Bilateral   . toric lens Left 07/15/12 and 08/05/12    Prior to Admission medications   Medication Sig Start Date End Date Taking? Authorizing Provider  acetaminophen (TYLENOL) 500 MG tablet Take 1,000 mg by mouth at bedtime as needed for moderate pain.    Yes Historical Provider, MD  Acidophilus Lactobacillus CAPS Take by mouth daily.   Yes Historical Provider, MD  conjugated estrogens (PREMARIN) vaginal cream Place 1 Applicatorful vaginally daily. Use pea sized amount M-W-Fr before bedtime 01/11/16  Yes Hollice Espy, MD  Cranberry 450 MG CAPS  Take 2 tablets by mouth daily.   Yes Historical Provider, MD  EPINEPHrine 0.3 mg/0.3 mL IJ SOAJ injection Inject into the muscle once.   Yes Historical Provider, MD  latanoprost (XALATAN) 0.005 % ophthalmic solution Place 1 drop into both eyes at bedtime.    Yes Historical Provider, MD  metoprolol succinate (TOPROL-XL) 25 MG 24 hr tablet Take 0.5 tablets (12.5 mg total) by mouth daily. 12/14/15  Yes Vaughan Basta, MD  metroNIDAZOLE (FLAGYL) 250 MG tablet Take 250 mg by mouth.   Yes Historical Provider, MD  ramipril (ALTACE) 10 MG capsule Take 10 mg by mouth 2 (two) times daily.    Yes Historical Provider, MD  timolol (TIMOPTIC) 0.5 % ophthalmic solution Place 1 drop into both eyes every morning.   Yes Historical Provider, MD  ciprofloxacin (CIPRO) 500 MG tablet Take 1 tablet (500 mg total) by mouth 2 (two) times daily. Patient not taking: Reported on 06/27/2016 04/05/16   Max Sane, MD  polyethylene glycol powder (GLYCOLAX/MIRALAX) powder  03/30/16   Historical Provider, MD  polyethylene glycol-electrolytes (NULYTELY/GOLYTELY) 420 g solution  03/30/16   Historical Provider, MD  PREVNAR 13 SUSP injection ADM 0.5ML IM UTD 01/31/16   Historical Provider, MD    Allergies as of 05/17/2016 - Review Complete 04/13/2016  Allergen Reaction Noted  . Penicillin g Hives 10/10/2015  . Sulfa antibiotics Other (See Comments) and Anaphylaxis 04/30/2014  . Sulfamethoxazole-trimethoprim  Itching, Palpitations, Other (See Comments), Rash, and Swelling 10/11/2015  . Articaine-epinephrine Other (See Comments) 04/30/2014  . Darvon [propoxyphene] Other (See Comments) 04/30/2014  . Erythromycin Other (See Comments) 04/30/2014  . Guaifenesin Other (See Comments) 03/04/2015  . Levofloxacin Other (See Comments) 10/12/2015  . Metronidazole Nausea Only 04/30/2014  . Molds & smuts Cough 03/02/2016  . Other Other (See Comments) 12/10/2014  . Penicillins Swelling 12/10/2014  . Statins Other (See Comments) 03/04/2015  .  Tetracycline Other (See Comments) 04/30/2014  . Erythromycin base Rash 12/10/2014  . Macrobid [nitrofurantoin] Rash 12/10/2014  . Spinach Palpitations 12/10/2014  . Tape Rash 12/10/2014  . Tetracyclines & related Rash 12/10/2014    Family History  Problem Relation Age of Onset  . Kidney Stones Mother   . Ovarian cancer Mother   . Heart disease Mother   . Heart disease Father   . Alzheimer's disease Sister   . Heart disease Maternal Grandmother   . Heart disease Maternal Grandfather   . Heart disease Paternal Grandmother   . Kidney disease Neg Hx     Social History   Social History  . Marital status: Widowed    Spouse name: N/A  . Number of children: N/A  . Years of education: N/A   Occupational History  . Not on file.   Social History Main Topics  . Smoking status: Never Smoker  . Smokeless tobacco: Never Used  . Alcohol use No  . Drug use: No  . Sexual activity: Not on file   Other Topics Concern  . Not on file   Social History Narrative  . No narrative on file    Review of Systems: See HPI, otherwise negative ROS  Physical Exam: BP (!) 158/81   Pulse 64   Temp 98.3 F (36.8 C) (Tympanic)   Resp 18   Ht 5\' 2"  (1.575 m)   Wt 54.9 kg (121 lb)   SpO2 100%   BMI 22.13 kg/m  General:   Alert,  pleasant and cooperative in NAD Head:  Normocephalic and atraumatic. Neck:  Supple; no masses or thyromegaly. Lungs:  Clear throughout to auscultation.    Heart:  Regular rate and rhythm. Abdomen:  Soft, nontender and nondistended. Normal bowel sounds, without guarding, and without rebound.   Neurologic:  Alert and  oriented x4;  grossly normal neurologically.  Impression/Plan: Maria [name] is here for an colonoscopy to be performed for abdominal pain and diverticulitis follow up.  Risks, benefits, limitations, and alternatives regarding  colonoscopy have been reviewed with the patient.  Questions have been answered.  All parties agreeable.   Gaylyn Cheers,  MD  06/27/2016, 9:01 AM

## 2016-06-27 NOTE — Anesthesia Postprocedure Evaluation (Signed)
Anesthesia Post Note  Patient: Microbiologist  Procedure(s) Performed: Procedure(s) (LRB): COLONOSCOPY WITH PROPOFOL (N/A)  Patient location during evaluation: Endoscopy Anesthesia Type: General Level of consciousness: awake and alert Pain management: pain level controlled Vital Signs Assessment: post-procedure vital signs reviewed and stable Respiratory status: spontaneous breathing, nonlabored ventilation, respiratory function stable and patient connected to nasal cannula oxygen Cardiovascular status: blood pressure returned to baseline and stable Postop Assessment: no signs of nausea or vomiting Anesthetic complications: no     Last Vitals:  Vitals:   06/27/16 1020 06/27/16 1030  BP: 120/69 133/75  Pulse: 81 78  Resp: 13 12  Temp:  36.4 C    Last Pain:  Vitals:   06/27/16 1030  TempSrc: Oral                 Precious Haws Piscitello

## 2016-06-27 NOTE — Anesthesia Post-op Follow-up Note (Signed)
Anesthesia QCDR form completed.        

## 2016-06-27 NOTE — Anesthesia Preprocedure Evaluation (Signed)
Anesthesia Evaluation  Patient identified by MRN, date of birth, ID band Patient awake    Reviewed: Allergy & Precautions, H&P , NPO status , Patient's Chart, lab work & pertinent test results  History of Anesthesia Complications (+) history of anesthetic complications  Airway Mallampati: III  TM Distance: <3 FB Neck ROM: limited    Dental  (+) Poor Dentition, Chipped, Caps   Pulmonary neg pulmonary ROS, neg shortness of breath,    Pulmonary exam normal breath sounds clear to auscultation       Cardiovascular Exercise Tolerance: Good hypertension, (-) angina+CHF  (-) DOE Normal cardiovascular exam+ dysrhythmias + pacemaker  Rhythm:regular Rate:Normal     Neuro/Psych Seizures -, Well Controlled,  negative psych ROS   GI/Hepatic negative GI ROS, Neg liver ROS,   Endo/Other  negative endocrine ROS  Renal/GU Renal disease  negative genitourinary   Musculoskeletal  (+) Arthritis ,   Abdominal   Peds  Hematology negative hematology ROS (+)   Anesthesia Other Findings Past Medical History: No date: Arthritis     Comment: fingers, knees No date: Cancer ([place])     Comment: melanoma No date: CHF (congestive heart failure) ([facility]) No date: Complication of anesthesia     Comment: Pt reports "coded" during spinal injection               during back surgery - 8 yrs ago - [place], [place] No date: Diverticulitis No date: Environmental and seasonal allergies No date: Hypercholesteremia No date: Hypertension No date: Left arm weakness     Comment: positional No date: Left bundle branch block (LBBB) No date: Motion sickness     Comment: seasick 06/2013: Seizures ([place])     Comment: reaction to exposure to allergan (dogs and               cats) No date: Vasovagal episode  Past Surgical History: No date: BACK SURGERY     Comment: multiple No date: CATARACT EXTRACTION W/ INTRAOCULAR LENS  IMPLA* 08/29/2006: HAMMERTOE  RECONSTRUCTION WITH WEIL OSTEOTOMY Left 08/27/2014: JOINT REPLACEMENT     Comment: knee 04/26/2008: KNEE ARTHROCENTESIS Left 06/25/2015: LUMBAR LAMINECTOMY N/A 12/15/2014: MASS EXCISION Left     Comment: Procedure: MINOR EXCISION OF MASS LEFT               INCLUSION CYST;  Surgeon: Beverly Gust, MD;               Location: [place];  Service: ENT;                Laterality: Left;  NEEDS 2 NURSE 03/14/2016: MELANOMA EXCISION Right No date: ROTATOR CUFF REPAIR Bilateral 07/15/12 and 08/05/12: toric lens Left  BMI    Body Mass Index:  22.13 kg/m      Reproductive/Obstetrics negative OB ROS                             Anesthesia Physical Anesthesia Plan  ASA: III  Anesthesia Plan: General   Post-op Pain Management:    Induction:   Airway Management Planned:   Additional Equipment:   Intra-op Plan:   Post-operative Plan:   Informed Consent: I have reviewed the patients History and Physical, chart, labs and discussed the procedure including the risks, benefits and alternatives for the proposed anesthesia with the patient or authorized representative who has indicated his/her understanding and acceptance.   Dental Advisory Given  Plan Discussed with: Anesthesiologist, CRNA and Surgeon  Anesthesia Plan Comments:         Anesthesia Quick Evaluation

## 2016-06-27 NOTE — Transfer of Care (Signed)
Immediate Anesthesia Transfer of Care Note  Patient: Maria [name]  Procedure(s) Performed: Procedure(s): COLONOSCOPY WITH PROPOFOL (N/A)  Patient Location: Endoscopy Unit  Anesthesia Type:General  Level of Consciousness: awake and alert   Airway & Oxygen Therapy: Patient Spontanous Breathing and Patient connected to nasal cannula oxygen  Post-op Assessment: Report given to RN and Post -op Vital signs reviewed and stable  Post vital signs: Reviewed and stable  Last Vitals:  Vitals:   06/27/16 0730  BP: (!) 158/81  Pulse: 64  Resp: 18  Temp: 36.8 C    Last Pain:  Vitals:   06/27/16 0730  TempSrc: Tympanic         Complications: No apparent anesthesia complications

## 2016-06-27 NOTE — Op Note (Signed)
[facility] Gastroenterology Patient Name: Maria [name] Procedure Date: 06/27/2016 9:20 AM MRN: [id number] Account #: [id number] Date of Birth: 1930-08-29 Admit Type: Outpatient Age: 83 Room: [facility] ENDO ROOM 3 Gender: Female Note Status: Finalized Procedure:            Colonoscopy Indications:          Abdominal pain in the left lower quadrant, Follow-up of                        diverticulitis Providers:            Manya Silvas, MD Referring MD:         Ocie Cornfield. Ouida Sills MD, MD (Referring MD) Medicines:            Propofol per Anesthesia Complications:        No immediate complications. Procedure:            Pre-Anesthesia Assessment:                       - After reviewing the risks and benefits, the patient                        was deemed in satisfactory condition to undergo the                        procedure.                       After obtaining informed consent, the colonoscope was                        passed under direct vision. Throughout the procedure,                        the patient's blood pressure, pulse, and oxygen                        saturations were monitored continuously. The                        Colonoscope was introduced through the anus and                        advanced to the the cecum, identified by appendiceal                        orifice and ileocecal valve. The colonoscopy was                        performed without difficulty. The patient tolerated the                        procedure well. The quality of the bowel preparation                        was excellent. Findings:      Two sessile polyps were found in the cecum. The polyps were diminutive       in size. These polyps were removed with a jumbo cold forceps. Resection       and retrieval were complete.      Multiple medium-mouthed  diverticula were found in the sigmoid colon.      External and internal hemorrhoids were found during endoscopy. The   hemorrhoids were small and Grade I (internal hemorrhoids that do not       prolapse). Impression:           - Two diminutive polyps in the cecum, removed with a                        jumbo cold forceps. Resected and retrieved.                       - Diverticulosis in the sigmoid colon.                       - External and internal hemorrhoids. Recommendation:       - Await pathology results. Manya Silvas, MD 06/27/2016 10:04:24 AM This report has been signed electronically. Number of Addenda: 0 Note Initiated On: 06/27/2016 9:20 AM Scope Withdrawal Time: 0 hours 10 minutes 25 seconds  Total Procedure Duration: 0 hours 26 minutes 54 seconds       [facility]

## 2016-06-28 ENCOUNTER — Encounter: Payer: Self-pay | Admitting: Unknown Physician Specialty

## 2016-06-28 LAB — SURGICAL PATHOLOGY

## 2016-08-06 ENCOUNTER — Ambulatory Visit: Payer: Self-pay | Admitting: Surgery

## 2017-01-21 IMAGING — CT CT ABD-PELV W/O CM
2 of 4 series · 16 of 46 positions shown, 18 images · non-contrast
Comparison: CT [DATE]/ 3735

CLINICAL DATA: LEFT side abdominal pain.  History diverticulitis.

EXAM:
CT ABDOMEN AND PELVIS WITHOUT CONTRAST
TECHNIQUE: Multidetector CT imaging of the abdomen and pelvis was performed
following the standard protocol without IV contrast.

[Series 2: axial st · axial · 0.69mm/px · z∈[-898,-493]mm · 13 of 89 slices shown, 15 images]
[im 4/89  soft-tissue]
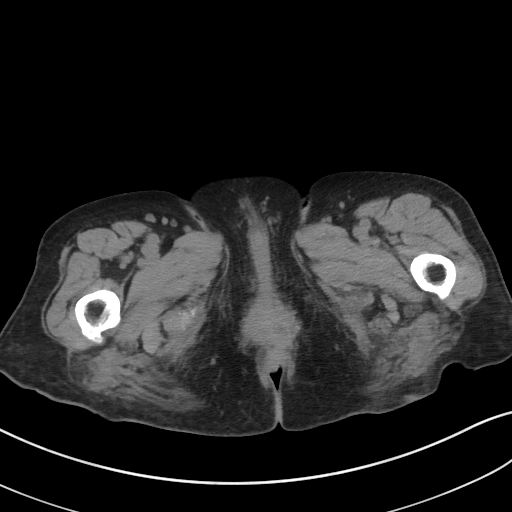
[im 4/89  bone]
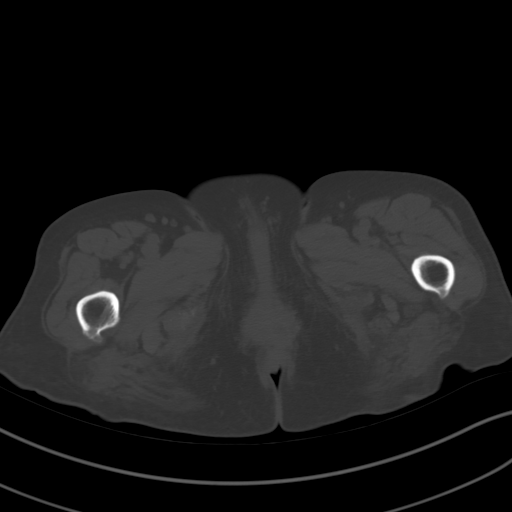
[im 12/89  soft-tissue]
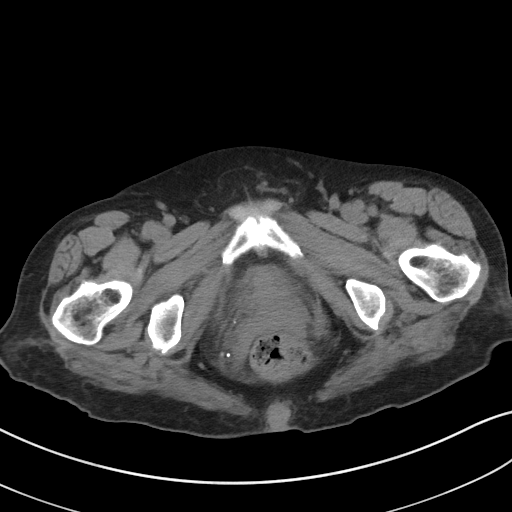
[im 19/89  soft-tissue]
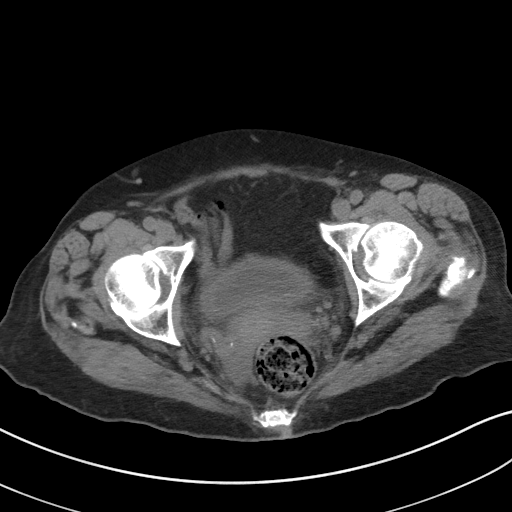
[im 26/89  soft-tissue]
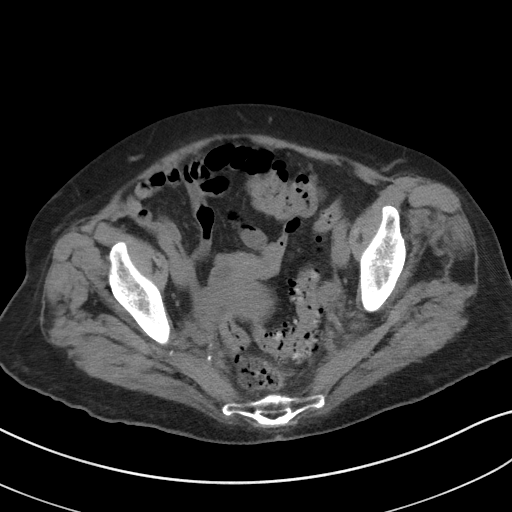
[im 30/89  soft-tissue]
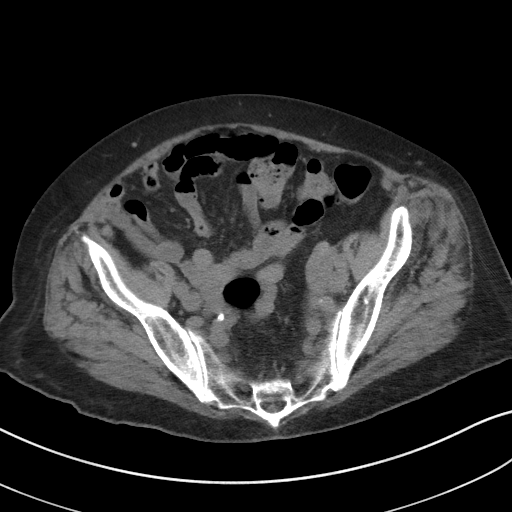
[im 37/89  soft-tissue]
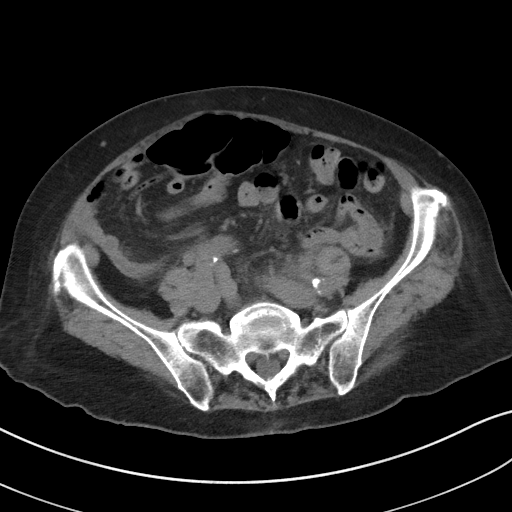
[im 45/89  soft-tissue]
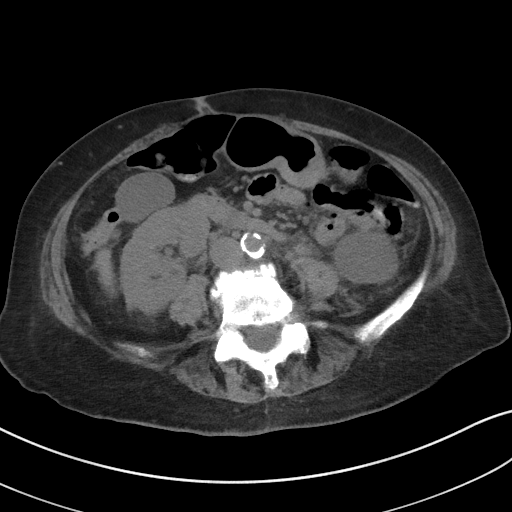
[im 52/89  soft-tissue]
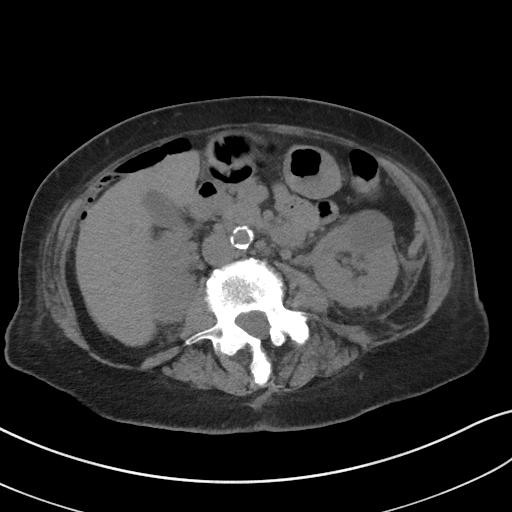
[im 59/89  soft-tissue]
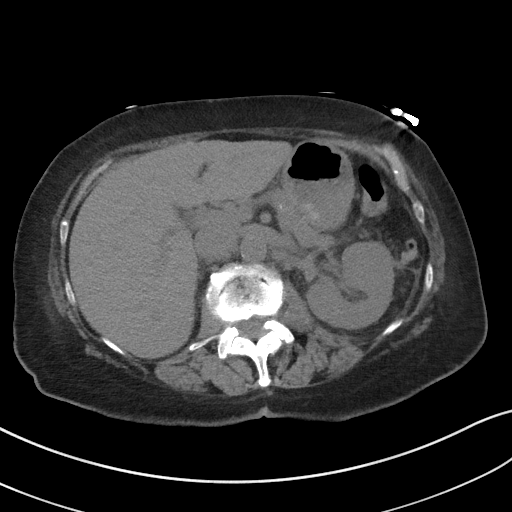
[im 59/89  bone]
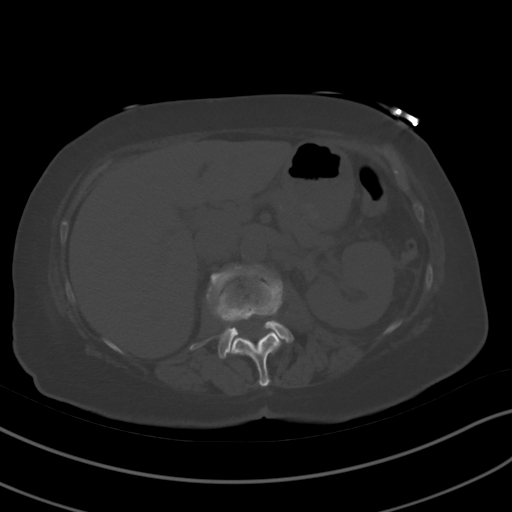
[im 63/89  soft-tissue]
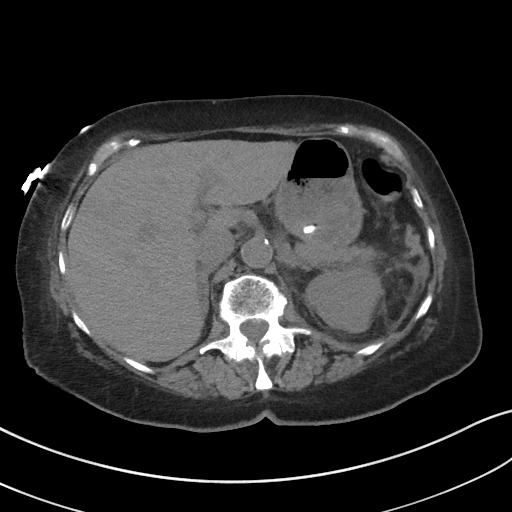
[im 70/89  soft-tissue]
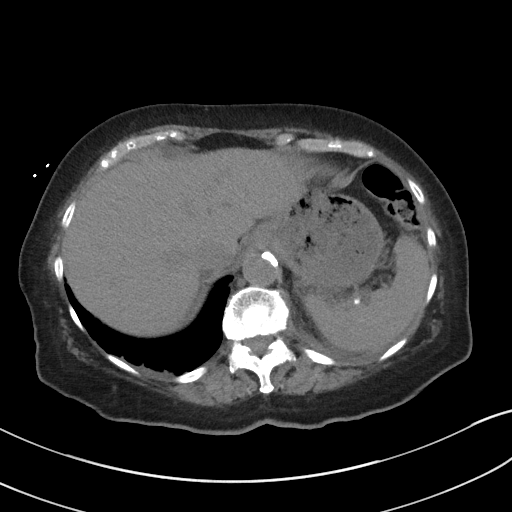
[im 78/89  soft-tissue]
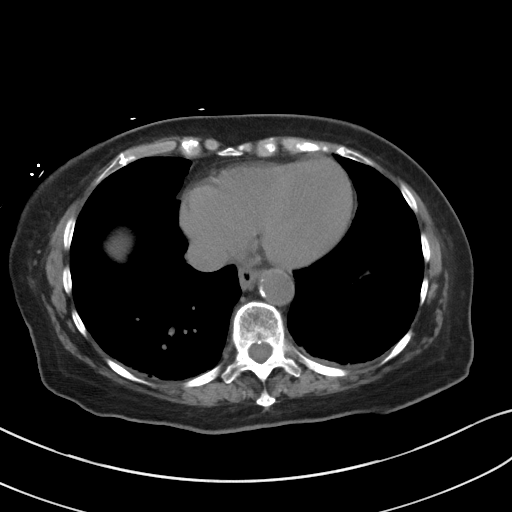
[im 85/89  soft-tissue]
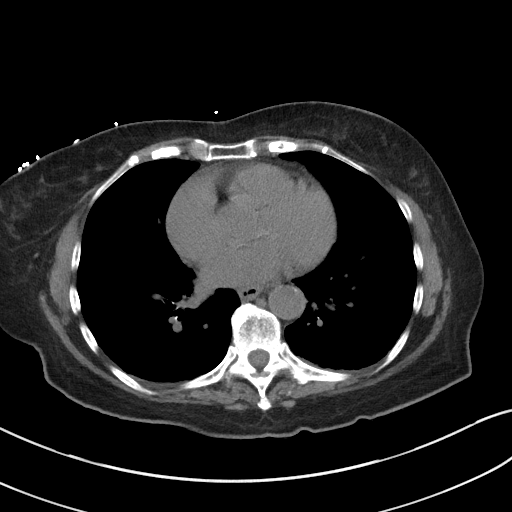

[Series 5: coronal · coronal · 0.66mm/px · 3 of 117 slices shown]
[im 39/117  soft-tissue]
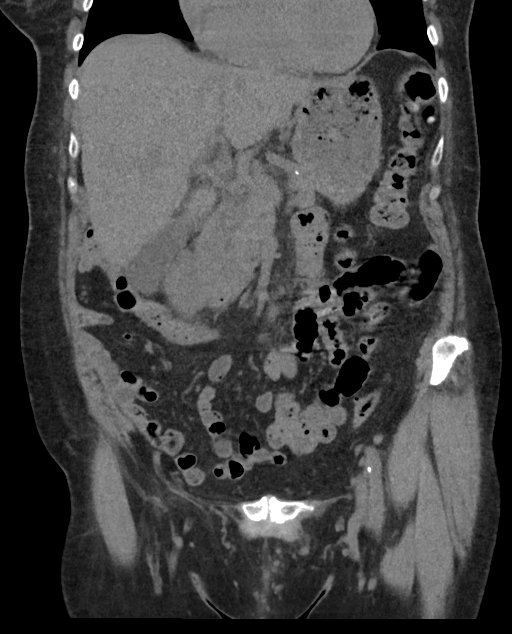
[im 52/117  soft-tissue]
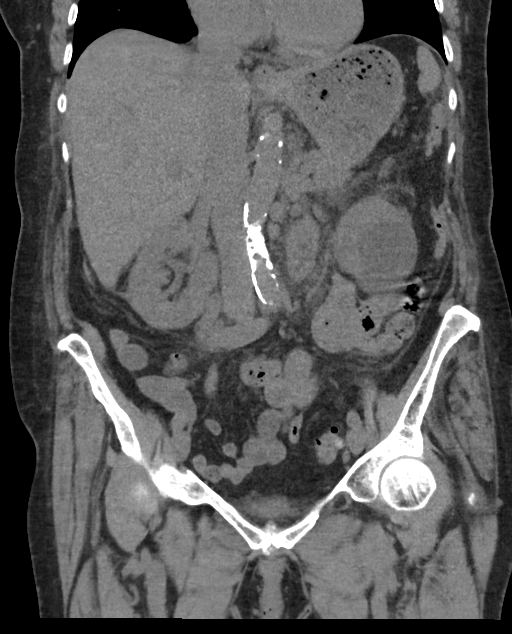
[im 65/117  soft-tissue]
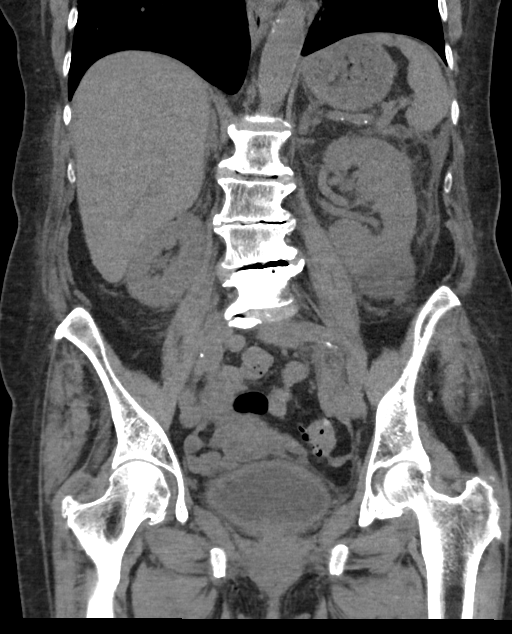

[16 of 46 positions shown; findings below may reference images not displayed]

FINDINGS: Lower chest: Lung bases are clear.

Hepatobiliary: No focal hepatic lesion. No biliary duct dilatation.
Gallbladder is normal. Common bile duct is normal.

Pancreas: Pancreas is normal. No ductal dilatation. No pancreatic
inflammation.

Spleen: Normal spleen

Adrenals/urinary tract: Adrenal glands normal.

There is new perinephric stranding on the LEFT compared to prior
exam. Low-attenuation cortical lesions are unchanged. No clear
evidence of calculus in the course of the LEFT ureter. Multiple
vascular calcifications are noted along the course of the ureter. No
bladder calculi.

No RIGHT nephrolithiasis or ureterolithiasis. Bladder mildly
thick-walled at 7 mm.

Stomach/Bowel: Stomach, duodenum, small-bowel, appendix and cecum
normal. The diverticula of the sigmoid colon descending colon
without acute inflammation. Rectum normal.

Vascular/Lymphatic: Abdominal aorta is normal caliber with
atherosclerotic calcification. There is no retroperitoneal or
periportal lymphadenopathy. No pelvic lymphadenopathy. Small
retroperitoneal lymph nodes similar comparison exam. Exam

Reproductive: Uterus and adnexa normal

Other: No free fluid.

Musculoskeletal: No aggressive osseous lesion.
IMPRESSION: 1. Interval perinephric stranding within the LEFT pararenal space.
Recommend correlation with pyelonephritis / urinary tract infection.
No obstructing calculus is identified.
2. Mildly thick-walled bladder is nonspecific.
3. Diverticulosis without evidence acute diverticulitis.

## 2017-03-13 ENCOUNTER — Emergency Department: Payer: Medicare Other

## 2017-03-13 ENCOUNTER — Encounter: Payer: Self-pay | Admitting: Emergency Medicine

## 2017-03-13 ENCOUNTER — Emergency Department
Admission: EM | Admit: 2017-03-13 | Discharge: 2017-03-13 | Disposition: A | Discharge status: Home / Self Care | Payer: Medicare Other | Attending: Emergency Medicine | Admitting: Emergency Medicine

## 2017-03-13 DIAGNOSIS — K5732 Diverticulitis of large intestine without perforation or abscess without bleeding: Secondary | ICD-10-CM | POA: Insufficient documentation

## 2017-03-13 DIAGNOSIS — I5022 Chronic systolic (congestive) heart failure: Secondary | ICD-10-CM | POA: Insufficient documentation

## 2017-03-13 DIAGNOSIS — R1032 Left lower quadrant pain: Secondary | ICD-10-CM | POA: Diagnosis present

## 2017-03-13 DIAGNOSIS — I110 Hypertensive heart disease with heart failure: Secondary | ICD-10-CM | POA: Insufficient documentation

## 2017-03-13 DIAGNOSIS — K5792 Diverticulitis of intestine, part unspecified, without perforation or abscess without bleeding: Secondary | ICD-10-CM

## 2017-03-13 LAB — LIPASE, BLOOD: Lipase: 31 U/L (ref 11–51)

## 2017-03-13 LAB — COMPREHENSIVE METABOLIC PANEL
ALK PHOS: 74 U/L (ref 38–126)
ALT: 15 U/L (ref 14–54)
ANION GAP: 8 (ref 5–15)
AST: 21 U/L (ref 15–41)
Albumin: 4 g/dL (ref 3.5–5.0)
BILIRUBIN TOTAL: 1.2 mg/dL (ref 0.3–1.2)
BUN: 14 mg/dL (ref 6–20)
CALCIUM: 8.9 mg/dL (ref 8.9–10.3)
CO2: 26 mmol/L (ref 22–32)
Chloride: 97 mmol/L — ABNORMAL LOW (ref 101–111)
Creatinine, Ser: 0.69 mg/dL (ref 0.44–1.00)
GFR calc non Af Amer: >60 mL/min (ref >60)
Glucose, Bld: 97 mg/dL (ref 65–99)
POTASSIUM: 4.1 mmol/L (ref 3.5–5.1)
SODIUM: 131 mmol/L — AB (ref 135–145)
TOTAL PROTEIN: 7.3 g/dL (ref 6.5–8.1)

## 2017-03-13 LAB — CBC
HEMATOCRIT: 39.5 % (ref 35.0–47.0)
HEMOGLOBIN: 13.2 g/dL (ref 12.0–16.0)
MCH: 30.7 pg (ref 26.0–34.0)
MCHC: 33.4 g/dL (ref 32.0–36.0)
MCV: 91.6 fL (ref 80.0–100.0)
Platelets: 229 10*3/uL (ref 150–440)
RBC: 4.31 MIL/uL (ref 3.80–5.20)
RDW: 13 % (ref 11.5–14.5)
WBC: 10.3 10*3/uL (ref 3.6–11.0)

## 2017-03-13 MED ORDER — MEROPENEM 1 G IV SOLR
1.0000 g | Freq: Three times a day (TID) | INTRAVENOUS | Status: DC
  Administered 2017-03-13: 1 g via INTRAVENOUS
  Filled 2017-03-13 (×3): qty 1

## 2017-03-13 MED ORDER — SODIUM CHLORIDE 0.9 % IV SOLN
Freq: Once | INTRAVENOUS | Status: AC
  Administered 2017-03-13: 11:00:00 via INTRAVENOUS

## 2017-03-13 MED ORDER — IOPAMIDOL (ISOVUE-300) INJECTION 61%
100.0000 mL | Freq: Once | INTRAVENOUS | Status: AC | PRN
  Administered 2017-03-13: 100 mL via INTRAVENOUS

## 2017-03-13 MED ORDER — METRONIDAZOLE 500 MG PO TABS: 500.0000 mg | ORAL_TABLET | Freq: Three times a day (TID) | ORAL | 0 refills | Status: DC

## 2017-03-13 MED ORDER — ONDANSETRON 4 MG PO TBDP: 4.0000 mg | ORAL_TABLET | Freq: Three times a day (TID) | ORAL | 0 refills | Status: DC | PRN

## 2017-03-13 MED ORDER — CIPROFLOXACIN HCL 500 MG PO TABS: 500.0000 mg | ORAL_TABLET | Freq: Two times a day (BID) | ORAL | 0 refills | Status: AC

## 2017-03-13 NOTE — ED Provider Notes (Signed)
[facility] Emergency Department Provider Note       Time seen: ----------------------------------------- 8:51 AM on 03/13/2017 -----------------------------------------   I have reviewed the triage vital signs and the nursing notes.  HISTORY   Chief Complaint Abdominal Pain    HPI Maria [name] is a 83 y.o. female with a history of CHF, diverticulitis, hypertension who presents to the ED for abdominal pain that started yesterday.  Patient states the last time she had this she became septic and the discussion was that she would likely not survive a partial colectomy, therefore it was treated medically.  She denies fevers or chills, has had nausea and has moderate pain just on the left abdomen at this time.  Past Medical History:  Diagnosis Date  . Arthritis    fingers, knees  . Cancer ([place])    melanoma  . CHF (congestive heart failure) ([place])   . Complication of anesthesia    Pt reports "coded" during spinal injection during back surgery - 8 yrs ago - [place], [place]  . Diverticulitis   . Environmental and seasonal allergies   . Hypercholesteremia   . Hypertension   . Left arm weakness    positional  . Left bundle branch block (LBBB)   . Motion sickness    seasick  . Seizures ([place]) 06/2013   reaction to exposure to allergan (dogs and cats)  . Vasovagal episode     Patient Active Problem List   Diagnosis Date Noted  . Colonic diverticular abscess 04/02/2016  . AKI (acute kidney injury) ([place]) 12/10/2015  . Diverticulitis of large intestine without perforation or abscess without bleeding 10/17/2015  . Abdominal pain, LLQ (left lower quadrant) 10/11/2015  . Chronic systolic CHF (congestive heart failure), NYHA class 2 ([place]) 03/24/2015  . Benign essential hypertension 03/16/2015  . Hyperlipidemia, mixed 03/16/2015  . Bundle branch block, left 09/01/2014  . Status post total right knee replacement 08/27/2014  . Diverticulosis of large intestine  without hemorrhage 06/08/2014  . Allergic rhinitis due to animal hair and dander 04/30/2014    Past Surgical History:  Procedure Laterality Date  . BACK SURGERY     multiple  . CATARACT EXTRACTION W/ INTRAOCULAR LENS  IMPLANT, BILATERAL    . COLONOSCOPY WITH PROPOFOL N/A 06/27/2016   Procedure: COLONOSCOPY WITH PROPOFOL;  Surgeon: Manya Silvas, MD;  Location: [facility] ENDOSCOPY;  Service: Endoscopy;  Laterality: N/A;  . HAMMERTOE RECONSTRUCTION WITH WEIL OSTEOTOMY Left 08/29/2006  . JOINT REPLACEMENT  08/27/2014   knee  . KNEE ARTHROCENTESIS Left 04/26/2008  . LUMBAR LAMINECTOMY N/A 06/25/2015  . MASS EXCISION Left 12/15/2014   Procedure: MINOR EXCISION OF MASS LEFT INCLUSION CYST;  Surgeon: Beverly Gust, MD;  Location: [facility];  Service: ENT;  Laterality: Left;  NEEDS 2 NURSE  . MELANOMA EXCISION Right 03/14/2016  . ROTATOR CUFF REPAIR Bilateral   . toric lens Left 07/15/12 and 08/05/12    Allergies Penicillin g; Sulfa antibiotics; Sulfamethoxazole-trimethoprim; Articaine-epinephrine; Darvon [propoxyphene]; Erythromycin; Guaifenesin; Levofloxacin; Metronidazole; Molds & smuts; Other; Penicillins; Statins; Tetracycline; Erythromycin base; Macrobid [nitrofurantoin]; Spinach; Tape; and Tetracyclines & related  Social History Social History   Tobacco Use  . Smoking status: Never Smoker  . Smokeless tobacco: Never Used  Substance Use Topics  . Alcohol use: No  . Drug use: No    Review of Systems Constitutional: Negative for fever. Cardiovascular: Negative for chest pain. Respiratory: Negative for shortness of breath. Gastrointestinal: Positive for abdominal pain, nausea Genitourinary: Negative for dysuria. Musculoskeletal: Negative for  back pain. Skin: Negative for rash. Neurological: Negative for headaches, focal weakness or numbness.  All systems negative/normal/unremarkable except as stated in the  HPI  ____________________________________________   PHYSICAL EXAM:  VITAL SIGNS: ED Triage Vitals  Enc Vitals Group     BP 03/13/17 0848 (!) 148/64     Pulse Rate 03/13/17 0848 68     Resp 03/13/17 0848 15     Temp 03/13/17 0848 98 F (36.7 C)     Temp Source 03/13/17 0848 Oral     SpO2 03/13/17 0848 100 %     Weight 03/13/17 0848 127 lb (57.6 kg)     Height 03/13/17 0848 5\' 2"  (1.575 m)     Head Circumference --      Peak Flow --      Pain Score 03/13/17 0847 7     Pain Loc --      Pain Edu? --      Excl. in [place]? --     Constitutional: Alert and oriented. Well appearing and in no distress. Eyes: Conjunctivae are normal. Normal extraocular movements. ENT   Head: Normocephalic and atraumatic.   Nose: No congestion/rhinnorhea.   Mouth/Throat: Mucous membranes are moist.   Neck: No stridor. Cardiovascular: Normal rate, regular rhythm. No murmurs, rubs, or gallops. Respiratory: Normal respiratory effort without tachypnea nor retractions. Breath sounds are clear and equal bilaterally. No wheezes/rales/rhonchi. Gastrointestinal: Left lower quadrant tenderness, no rebound or guarding.  Normal bowel sounds. Musculoskeletal: Nontender with normal range of motion in extremities. No lower extremity tenderness nor edema. Neurologic:  Normal speech and language. No gross focal neurologic deficits are appreciated.  Skin:  Skin is warm, dry and intact. No rash noted. Psychiatric: Mood and affect are normal. Speech and behavior are normal.  ____________________________________________  ED COURSE:  As part of my medical decision making, I reviewed the following data within the [place] History obtained from family if available, nursing notes, old chart and ekg, as well as notes from prior ED visits. Patient presented for likely diverticulitis and left lower quadrant pain, we will assess with labs and imaging as indicated at this time.    Procedures ____________________________________________   LABS (pertinent positives/negatives)  Labs Reviewed  COMPREHENSIVE METABOLIC PANEL - Abnormal; Notable for the following components:      Result Value   Sodium 131 (*)    Chloride 97 (*)    All other components within normal limits  LIPASE, BLOOD  CBC  URINALYSIS, COMPLETE (UACMP) WITH MICROSCOPIC    RADIOLOGY Images were viewed by me  CT of the abdomen and pelvis with contrast  IMPRESSION: Acute sigmoid diverticulitis. Stranding of the fat. No evidence of abscess or gross perforation. Small amount of free fluid in the pelvis. ____________________________________________  DIFFERENTIAL DIAGNOSIS   Diverticulitis, peritonitis, abscess, AAA, dissection, constipation, renal colic  FINAL ASSESSMENT AND PLAN  Diverticulitis  Plan: Patient had presented for left lower quadrant abdominal pain. Patient's labs were negative. Patient's imaging did reveal acute diverticulitis without abscess.  She has had an abscess in the past but does not have one currently.  We have given her IV meropenem and have advised follow-up in 2 days for recheck.  She will be given Cipro and Flagyl which has worked for her in the past.  Despite some possible allergies she is able to tolerate these medications.  She is stable for outpatient follow-up.   Earleen Newport, MD   Note: This note was generated in part or whole  with voice recognition software. Voice recognition is usually quite accurate but there are transcription errors that can and very often do occur. I apologize for any typographical errors that were not detected and corrected.     Earleen Newport, MD 03/13/17 [phone]

## 2017-03-13 NOTE — ED Notes (Signed)
FIRST NURSE NOTE: Pt has hx of diverticulitis, states Dr. Vira Agar told her to come in for a CT scan.

## 2017-03-13 NOTE — ED Triage Notes (Signed)
Patient presents to ED via POV from home with c/o left lower abdominal pain that started yesterday. Hx of diverticulitis. Ambulatory to triage.

## 2017-03-13 NOTE — ED Notes (Signed)
CT called and notified that Comp. Met. Had resulted.

## 2018-01-24 ENCOUNTER — Inpatient Hospital Stay: Payer: Medicare Other

## 2018-01-24 ENCOUNTER — Emergency Department: Payer: Medicare Other

## 2018-01-24 ENCOUNTER — Inpatient Hospital Stay: Payer: Medicare Other | Admitting: Anesthesiology

## 2018-01-24 ENCOUNTER — Other Ambulatory Visit: Payer: Self-pay

## 2018-01-24 ENCOUNTER — Encounter
Admission: EM | Disposition: A | Discharge status: Skilled Nursing Facility | Payer: Self-pay | Source: Home / Self Care | Attending: Internal Medicine

## 2018-01-24 ENCOUNTER — Inpatient Hospital Stay
Admission: EM | Admit: 2018-01-24 | Discharge: 2018-01-27 | DRG: 470 | Disposition: A | Discharge status: Skilled Nursing Facility | Payer: Medicare Other | Attending: Internal Medicine | Admitting: Internal Medicine

## 2018-01-24 DIAGNOSIS — M1990 Unspecified osteoarthritis, unspecified site: Secondary | ICD-10-CM | POA: Diagnosis present

## 2018-01-24 DIAGNOSIS — Z881 Allergy status to other antibiotic agents status: Secondary | ICD-10-CM | POA: Diagnosis not present

## 2018-01-24 DIAGNOSIS — W010XXA Fall on same level from slipping, tripping and stumbling without subsequent striking against object, initial encounter: Secondary | ICD-10-CM | POA: Diagnosis present

## 2018-01-24 DIAGNOSIS — H409 Unspecified glaucoma: Secondary | ICD-10-CM | POA: Diagnosis present

## 2018-01-24 DIAGNOSIS — S72011A Unspecified intracapsular fracture of right femur, initial encounter for closed fracture: Principal | ICD-10-CM | POA: Diagnosis present

## 2018-01-24 DIAGNOSIS — Z91048 Other nonmedicinal substance allergy status: Secondary | ICD-10-CM | POA: Diagnosis not present

## 2018-01-24 DIAGNOSIS — Z85820 Personal history of malignant melanoma of skin: Secondary | ICD-10-CM | POA: Diagnosis not present

## 2018-01-24 DIAGNOSIS — Z880 Allergy status to penicillin: Secondary | ICD-10-CM | POA: Diagnosis not present

## 2018-01-24 DIAGNOSIS — W19XXXA Unspecified fall, initial encounter: Secondary | ICD-10-CM

## 2018-01-24 DIAGNOSIS — I110 Hypertensive heart disease with heart failure: Secondary | ICD-10-CM | POA: Diagnosis present

## 2018-01-24 DIAGNOSIS — Z8781 Personal history of (healed) traumatic fracture: Secondary | ICD-10-CM

## 2018-01-24 DIAGNOSIS — Z91018 Allergy to other foods: Secondary | ICD-10-CM | POA: Diagnosis not present

## 2018-01-24 DIAGNOSIS — S72009A Fracture of unspecified part of neck of unspecified femur, initial encounter for closed fracture: Secondary | ICD-10-CM | POA: Diagnosis present

## 2018-01-24 DIAGNOSIS — S72001A Fracture of unspecified part of neck of right femur, initial encounter for closed fracture: Secondary | ICD-10-CM

## 2018-01-24 DIAGNOSIS — I5032 Chronic diastolic (congestive) heart failure: Secondary | ICD-10-CM | POA: Diagnosis present

## 2018-01-24 DIAGNOSIS — Z79899 Other long term (current) drug therapy: Secondary | ICD-10-CM | POA: Diagnosis not present

## 2018-01-24 DIAGNOSIS — G8918 Other acute postprocedural pain: Secondary | ICD-10-CM

## 2018-01-24 DIAGNOSIS — Z96651 Presence of right artificial knee joint: Secondary | ICD-10-CM | POA: Diagnosis present

## 2018-01-24 DIAGNOSIS — M1611 Unilateral primary osteoarthritis, right hip: Secondary | ICD-10-CM | POA: Diagnosis present

## 2018-01-24 DIAGNOSIS — I447 Left bundle-branch block, unspecified: Secondary | ICD-10-CM | POA: Diagnosis present

## 2018-01-24 DIAGNOSIS — Z66 Do not resuscitate: Secondary | ICD-10-CM | POA: Diagnosis present

## 2018-01-24 DIAGNOSIS — M79672 Pain in left foot: Secondary | ICD-10-CM | POA: Diagnosis present

## 2018-01-24 DIAGNOSIS — M79673 Pain in unspecified foot: Secondary | ICD-10-CM

## 2018-01-24 DIAGNOSIS — Z882 Allergy status to sulfonamides status: Secondary | ICD-10-CM | POA: Diagnosis not present

## 2018-01-24 DIAGNOSIS — E785 Hyperlipidemia, unspecified: Secondary | ICD-10-CM | POA: Diagnosis present

## 2018-01-24 HISTORY — PX: TOTAL HIP ARTHROPLASTY: SHX124

## 2018-01-24 LAB — URINALYSIS, COMPLETE (UACMP) WITH MICROSCOPIC
BACTERIA UA: NONE SEEN
Bilirubin Urine: NEGATIVE
Glucose, UA: NEGATIVE mg/dL
Ketones, ur: NEGATIVE mg/dL
Leukocytes, UA: NEGATIVE
Nitrite: NEGATIVE
PH: 7 (ref 5.0–8.0)
Protein, ur: NEGATIVE mg/dL
SPECIFIC GRAVITY, URINE: 1.003 — AB (ref 1.005–1.030)
SQUAMOUS EPITHELIAL / LPF: NONE SEEN (ref 0–5)

## 2018-01-24 LAB — COMPREHENSIVE METABOLIC PANEL
ALBUMIN: 4.2 g/dL (ref 3.5–5.0)
ALK PHOS: 61 U/L (ref 38–126)
ALT: 20 U/L (ref 0–44)
ANION GAP: 12 (ref 5–15)
AST: 27 U/L (ref 15–41)
BUN: 13 mg/dL (ref 8–23)
CALCIUM: 9.3 mg/dL (ref 8.9–10.3)
CO2: 24 mmol/L (ref 22–32)
Chloride: 98 mmol/L (ref 98–111)
Creatinine, Ser: 0.66 mg/dL (ref 0.44–1.00)
GFR calc Af Amer: >60 mL/min (ref >60)
GFR calc non Af Amer: >60 mL/min (ref >60)
Glucose, Bld: 104 mg/dL — ABNORMAL HIGH (ref 70–99)
Potassium: 4.1 mmol/L (ref 3.5–5.1)
SODIUM: 134 mmol/L — AB (ref 135–145)
Total Bilirubin: 1.2 mg/dL (ref 0.3–1.2)
Total Protein: 7.6 g/dL (ref 6.5–8.1)

## 2018-01-24 LAB — CBC
HCT: 41.1 % (ref 36.0–46.0)
Hemoglobin: 13.3 g/dL (ref 12.0–15.0)
MCH: 30.2 pg (ref 26.0–34.0)
MCHC: 32.4 g/dL (ref 30.0–36.0)
MCV: 93.4 fL (ref 80.0–100.0)
Platelets: 169 10*3/uL (ref 150–400)
RBC: 4.4 MIL/uL (ref 3.87–5.11)
RDW: 12.6 % (ref 11.5–15.5)
WBC: 8.9 10*3/uL (ref 4.0–10.5)
nRBC: 0 % (ref 0.0–0.2)

## 2018-01-24 LAB — TYPE AND SCREEN
ABO/RH(D): B POS
Antibody Screen: NEGATIVE

## 2018-01-24 LAB — PROTIME-INR
INR: 0.95
Prothrombin Time: 12.6 seconds (ref 11.4–15.2)

## 2018-01-24 LAB — APTT: APTT: 28 s (ref 24–36)

## 2018-01-24 SURGERY — ARTHROPLASTY, HIP, TOTAL, ANTERIOR APPROACH
Anesthesia: Spinal | Site: Hip | Laterality: Right

## 2018-01-24 MED ORDER — BRIMONIDINE TARTRATE-TIMOLOL 0.2-0.5 % OP SOLN
1.0000 [drp] | Freq: Two times a day (BID) | OPHTHALMIC | Status: DC
  Filled 2018-01-24: qty 5

## 2018-01-24 MED ORDER — MAGNESIUM CITRATE PO SOLN
1.0000 | Freq: Once | ORAL | Status: DC | PRN
  Filled 2018-01-24: qty 296

## 2018-01-24 MED ORDER — ACETAMINOPHEN 500 MG PO TABS
500.0000 mg | ORAL_TABLET | Freq: Four times a day (QID) | ORAL | Status: AC
  Administered 2018-01-25 (×2): 500 mg via ORAL
  Filled 2018-01-24 (×4): qty 1

## 2018-01-24 MED ORDER — ONDANSETRON HCL 4 MG/2ML IJ SOLN
INTRAMUSCULAR | Status: AC
  Administered 2018-01-24: 4 mg via INTRAVENOUS
  Filled 2018-01-24: qty 2

## 2018-01-24 MED ORDER — PHENOL 1.4 % MT LIQD
1.0000 | OROMUCOSAL | Status: DC | PRN
  Filled 2018-01-24: qty 177

## 2018-01-24 MED ORDER — RISAQUAD PO CAPS
1.0000 | ORAL_CAPSULE | Freq: Every day | ORAL | Status: DC
  Administered 2018-01-25 – 2018-01-27 (×3): 1 via ORAL
  Filled 2018-01-24 (×4): qty 1

## 2018-01-24 MED ORDER — METOCLOPRAMIDE HCL 10 MG PO TABS: 5.0000 mg | ORAL_TABLET | Freq: Three times a day (TID) | ORAL | Status: DC | PRN

## 2018-01-24 MED ORDER — ROCURONIUM BROMIDE 50 MG/5ML IV SOLN
INTRAVENOUS | Status: AC
  Filled 2018-01-24: qty 1

## 2018-01-24 MED ORDER — PROPOFOL 10 MG/ML IV BOLUS
INTRAVENOUS | Status: DC | PRN
  Administered 2018-01-24: 25 mg via INTRAVENOUS

## 2018-01-24 MED ORDER — CRANBERRY 450 MG PO CAPS: 2.0000 | ORAL_CAPSULE | Freq: Every day | ORAL | Status: DC

## 2018-01-24 MED ORDER — SODIUM CHLORIDE 0.9 % IV SOLN
INTRAVENOUS | Status: DC | PRN
  Administered 2018-01-24: 50 ug/min via INTRAVENOUS

## 2018-01-24 MED ORDER — PHENYLEPHRINE HCL 10 MG/ML IJ SOLN
INTRAMUSCULAR | Status: DC | PRN
  Administered 2018-01-24: 100 ug via INTRAVENOUS

## 2018-01-24 MED ORDER — LACTATED RINGERS IV SOLN
INTRAVENOUS | Status: DC | PRN
  Administered 2018-01-24: 17:00:00 via INTRAVENOUS

## 2018-01-24 MED ORDER — BUPIVACAINE-EPINEPHRINE 0.25% -1:200000 IJ SOLN
INTRAMUSCULAR | Status: DC | PRN
  Administered 2018-01-24: 30 mL

## 2018-01-24 MED ORDER — EPHEDRINE SULFATE 50 MG/ML IJ SOLN
INTRAMUSCULAR | Status: DC | PRN
  Administered 2018-01-24 (×2): 10 mg via INTRAVENOUS

## 2018-01-24 MED ORDER — ASPIRIN 81 MG PO CHEW
81.0000 mg | CHEWABLE_TABLET | Freq: Two times a day (BID) | ORAL | Status: DC
  Administered 2018-01-24 – 2018-01-27 (×5): 81 mg via ORAL
  Filled 2018-01-24 (×5): qty 1

## 2018-01-24 MED ORDER — ACETAMINOPHEN 325 MG PO TABS
650.0000 mg | ORAL_TABLET | Freq: Four times a day (QID) | ORAL | Status: DC | PRN
  Administered 2018-01-26: 650 mg via ORAL
  Filled 2018-01-24: qty 2

## 2018-01-24 MED ORDER — ONDANSETRON HCL 4 MG PO TABS: 4.0000 mg | ORAL_TABLET | Freq: Four times a day (QID) | ORAL | Status: DC | PRN

## 2018-01-24 MED ORDER — TIMOLOL MALEATE 0.5 % OP SOLN
1.0000 [drp] | OPHTHALMIC | Status: DC
  Filled 2018-01-24 (×2): qty 5

## 2018-01-24 MED ORDER — MIDAZOLAM HCL 5 MG/5ML IJ SOLN
INTRAMUSCULAR | Status: DC | PRN
  Administered 2018-01-24: 2 mg via INTRAVENOUS

## 2018-01-24 MED ORDER — METHOCARBAMOL 1000 MG/10ML IJ SOLN
500.0000 mg | Freq: Four times a day (QID) | INTRAVENOUS | Status: DC | PRN
  Filled 2018-01-24: qty 5

## 2018-01-24 MED ORDER — SUCCINYLCHOLINE CHLORIDE 20 MG/ML IJ SOLN
INTRAMUSCULAR | Status: AC
  Filled 2018-01-24: qty 1

## 2018-01-24 MED ORDER — MAGNESIUM HYDROXIDE 400 MG/5ML PO SUSP
30.0000 mL | Freq: Every day | ORAL | Status: DC | PRN
  Administered 2018-01-26: 30 mL via ORAL
  Filled 2018-01-24 (×2): qty 30

## 2018-01-24 MED ORDER — SODIUM CHLORIDE 0.9 % IV SOLN
INTRAVENOUS | Status: DC
  Administered 2018-01-24: 22:00:00 via INTRAVENOUS

## 2018-01-24 MED ORDER — PROPOFOL 500 MG/50ML IV EMUL
INTRAVENOUS | Status: DC | PRN
  Administered 2018-01-24: 25 ug/kg/min via INTRAVENOUS

## 2018-01-24 MED ORDER — EPHEDRINE SULFATE 50 MG/ML IJ SOLN
INTRAMUSCULAR | Status: AC
  Filled 2018-01-24: qty 1

## 2018-01-24 MED ORDER — BRIMONIDINE TARTRATE 0.2 % OP SOLN
1.0000 [drp] | Freq: Two times a day (BID) | OPHTHALMIC | Status: DC
  Administered 2018-01-27: 1 [drp] via OPHTHALMIC
  Filled 2018-01-24 (×2): qty 5

## 2018-01-24 MED ORDER — FENTANYL CITRATE (PF) 100 MCG/2ML IJ SOLN
INTRAMUSCULAR | Status: AC
  Filled 2018-01-24: qty 2

## 2018-01-24 MED ORDER — FENTANYL CITRATE (PF) 100 MCG/2ML IJ SOLN
INTRAMUSCULAR | Status: AC
  Administered 2018-01-24: 25 ug via INTRAVENOUS
  Filled 2018-01-24: qty 2

## 2018-01-24 MED ORDER — LATANOPROST 0.005 % OP SOLN
1.0000 [drp] | Freq: Every day | OPHTHALMIC | Status: DC
  Administered 2018-01-24 – 2018-01-26 (×3): 1 [drp] via OPHTHALMIC
  Filled 2018-01-24: qty 2.5

## 2018-01-24 MED ORDER — ACETAMINOPHEN 650 MG RE SUPP: 650.0000 mg | Freq: Four times a day (QID) | RECTAL | Status: DC | PRN

## 2018-01-24 MED ORDER — DOCUSATE SODIUM 100 MG PO CAPS
100.0000 mg | ORAL_CAPSULE | Freq: Two times a day (BID) | ORAL | Status: DC
  Administered 2018-01-24 – 2018-01-27 (×5): 100 mg via ORAL
  Filled 2018-01-24 (×5): qty 1

## 2018-01-24 MED ORDER — RAMIPRIL 10 MG PO CAPS
10.0000 mg | ORAL_CAPSULE | Freq: Two times a day (BID) | ORAL | Status: DC
  Filled 2018-01-24 (×2): qty 1

## 2018-01-24 MED ORDER — KETOROLAC TROMETHAMINE 15 MG/ML IJ SOLN: 15.0000 mg | Freq: Four times a day (QID) | INTRAMUSCULAR | Status: DC | PRN

## 2018-01-24 MED ORDER — FENTANYL CITRATE (PF) 100 MCG/2ML IJ SOLN: 50.0000 ug | INTRAMUSCULAR | Status: DC | PRN

## 2018-01-24 MED ORDER — FENTANYL CITRATE (PF) 100 MCG/2ML IJ SOLN
25.0000 ug | INTRAMUSCULAR | Status: DC | PRN
  Administered 2018-01-24: 25 ug via INTRAVENOUS

## 2018-01-24 MED ORDER — ONDANSETRON HCL 4 MG/2ML IJ SOLN
4.0000 mg | Freq: Four times a day (QID) | INTRAMUSCULAR | Status: DC | PRN
  Filled 2018-01-24: qty 2

## 2018-01-24 MED ORDER — DIPHENHYDRAMINE HCL 12.5 MG/5ML PO ELIX: 12.5000 mg | ORAL_SOLUTION | ORAL | Status: DC | PRN

## 2018-01-24 MED ORDER — NEOMYCIN-POLYMYXIN B GU 40-200000 IR SOLN
Status: DC | PRN
  Administered 2018-01-24: 4 mL

## 2018-01-24 MED ORDER — BUPIVACAINE HCL (PF) 0.5 % IJ SOLN
INTRAMUSCULAR | Status: DC | PRN
  Administered 2018-01-24: 3 mL via INTRATHECAL

## 2018-01-24 MED ORDER — MORPHINE SULFATE (PF) 2 MG/ML IV SOLN
0.5000 mg | INTRAVENOUS | Status: DC | PRN
  Administered 2018-01-24 – 2018-01-25 (×3): 1 mg via INTRAVENOUS
  Filled 2018-01-24 (×3): qty 1

## 2018-01-24 MED ORDER — CLINDAMYCIN PHOSPHATE 600 MG/50ML IV SOLN
600.0000 mg | Freq: Once | INTRAVENOUS | Status: AC
  Administered 2018-01-24: 600 mg via INTRAVENOUS
  Filled 2018-01-24: qty 50

## 2018-01-24 MED ORDER — ALUM & MAG HYDROXIDE-SIMETH 200-200-20 MG/5ML PO SUSP: 30.0000 mL | ORAL | Status: DC | PRN

## 2018-01-24 MED ORDER — ONDANSETRON HCL 4 MG/2ML IJ SOLN
4.0000 mg | Freq: Once | INTRAMUSCULAR | Status: AC | PRN
  Administered 2018-01-24: 4 mg via INTRAVENOUS

## 2018-01-24 MED ORDER — PROPOFOL 10 MG/ML IV BOLUS
INTRAVENOUS | Status: AC
  Filled 2018-01-24: qty 20

## 2018-01-24 MED ORDER — PROPOFOL 500 MG/50ML IV EMUL
INTRAVENOUS | Status: AC
  Filled 2018-01-24: qty 50

## 2018-01-24 MED ORDER — LOSARTAN POTASSIUM 50 MG PO TABS: 100.0000 mg | ORAL_TABLET | Freq: Every day | ORAL | Status: DC

## 2018-01-24 MED ORDER — ZOLPIDEM TARTRATE 5 MG PO TABS: 5.0000 mg | ORAL_TABLET | Freq: Every evening | ORAL | Status: DC | PRN

## 2018-01-24 MED ORDER — KETAMINE HCL 50 MG/ML IJ SOLN
INTRAMUSCULAR | Status: DC | PRN
  Administered 2018-01-24: 25 mg via INTRAVENOUS

## 2018-01-24 MED ORDER — MENTHOL 3 MG MT LOZG
1.0000 | LOZENGE | OROMUCOSAL | Status: DC | PRN
  Filled 2018-01-24: qty 9

## 2018-01-24 MED ORDER — SODIUM CHLORIDE 0.9 % IV BOLUS
1000.0000 mL | Freq: Once | INTRAVENOUS | Status: AC
  Administered 2018-01-24: 1000 mL via INTRAVENOUS

## 2018-01-24 MED ORDER — CLINDAMYCIN PHOSPHATE 600 MG/50ML IV SOLN
600.0000 mg | Freq: Four times a day (QID) | INTRAVENOUS | Status: AC
  Administered 2018-01-24 – 2018-01-25 (×2): 600 mg via INTRAVENOUS
  Filled 2018-01-24 (×2): qty 50

## 2018-01-24 MED ORDER — HYDROCODONE-ACETAMINOPHEN 5-325 MG PO TABS
1.0000 | ORAL_TABLET | ORAL | Status: DC | PRN
  Administered 2018-01-25 – 2018-01-27 (×6): 1 via ORAL
  Filled 2018-01-24 (×6): qty 1

## 2018-01-24 MED ORDER — BISACODYL 10 MG RE SUPP
10.0000 mg | Freq: Every day | RECTAL | Status: DC | PRN
  Administered 2018-01-27: 10 mg via RECTAL
  Filled 2018-01-24: qty 1

## 2018-01-24 MED ORDER — METOCLOPRAMIDE HCL 5 MG/ML IJ SOLN: 5.0000 mg | Freq: Three times a day (TID) | INTRAMUSCULAR | Status: DC | PRN

## 2018-01-24 MED ORDER — CARVEDILOL 6.25 MG PO TABS
6.2500 mg | ORAL_TABLET | Freq: Two times a day (BID) | ORAL | Status: DC
  Administered 2018-01-24 – 2018-01-27 (×4): 6.25 mg via ORAL
  Filled 2018-01-24 (×3): qty 2
  Filled 2018-01-24 (×2): qty 1
  Filled 2018-01-24: qty 2
  Filled 2018-01-24 (×5): qty 1

## 2018-01-24 MED ORDER — METHOCARBAMOL 500 MG PO TABS: 500.0000 mg | ORAL_TABLET | Freq: Four times a day (QID) | ORAL | Status: DC | PRN

## 2018-01-24 SURGICAL SUPPLY — 58 items
BLADE SAGITTAL AGGR TOOTH XLG (BLADE) ×3 IMPLANT
BNDG COHESIVE 6X5 TAN STRL LF (GAUZE/BANDAGES/DRESSINGS) ×9 IMPLANT
CANISTER SUCT 1200ML W/VALVE (MISCELLANEOUS) ×3 IMPLANT
CHLORAPREP W/TINT 26ML (MISCELLANEOUS) ×3 IMPLANT
COVER WAND RF STERILE (DRAPES) ×3 IMPLANT
DRAPE C-ARM XRAY 36X54 (DRAPES) ×3 IMPLANT
DRAPE INCISE IOBAN 66X60 STRL (DRAPES) IMPLANT
DRAPE POUCH INSTRU U-SHP 10X18 (DRAPES) ×3 IMPLANT
DRAPE SHEET LG 3/4 BI-LAMINATE (DRAPES) ×9 IMPLANT
DRAPE TABLE BACK 80X90 (DRAPES) ×3 IMPLANT
DRESSING SURGICEL FIBRLLR 1X2 (HEMOSTASIS) ×2 IMPLANT
DRSG OPSITE POSTOP 4X8 (GAUZE/BANDAGES/DRESSINGS) ×6 IMPLANT
DRSG SURGICEL FIBRILLAR 1X2 (HEMOSTASIS) ×6
ELECT BLADE 6.5 EXT (BLADE) ×3 IMPLANT
ELECT REM PT RETURN 9FT ADLT (ELECTROSURGICAL) ×3
ELECTRODE REM PT RTRN 9FT ADLT (ELECTROSURGICAL) ×1 IMPLANT
GLOVE BIOGEL PI IND STRL 9 (GLOVE) ×1 IMPLANT
GLOVE BIOGEL PI INDICATOR 9 (GLOVE) ×2
GLOVE SURG SYN 9.0  PF PI (GLOVE) ×4
GLOVE SURG SYN 9.0 PF PI (GLOVE) ×2 IMPLANT
GOWN SRG 2XL LVL 4 RGLN SLV (GOWNS) ×1 IMPLANT
GOWN STRL NON-REIN 2XL LVL4 (GOWNS) ×2
GOWN STRL REUS W/ TWL LRG LVL3 (GOWN DISPOSABLE) ×1 IMPLANT
GOWN STRL REUS W/TWL LRG LVL3 (GOWN DISPOSABLE) ×2
HEAD FEM S 22.2MM -2.5 0125124 (Head) ×3 IMPLANT
HEMOVAC 400CC 10FR (MISCELLANEOUS) IMPLANT
HOLDER FOLEY CATH W/STRAP (MISCELLANEOUS) ×3 IMPLANT
HOOD PEEL AWAY FLYTE STAYCOOL (MISCELLANEOUS) ×3 IMPLANT
KIT PREVENA INCISION MGT 13 (CANNISTER) ×3 IMPLANT
LINER DMC HEAD 0 22.2MM 012622 (Liner) ×3 IMPLANT
MAT ABSORB  FLUID 56X50 GRAY (MISCELLANEOUS) ×2
MAT ABSORB FLUID 56X50 GRAY (MISCELLANEOUS) ×1 IMPLANT
NDL SAFETY ECLIPSE 18X1.5 (NEEDLE) ×1 IMPLANT
NEEDLE HYPO 18GX1.5 SHARP (NEEDLE) ×2
NEEDLE SPNL 18GX3.5 QUINCKE PK (NEEDLE) ×3 IMPLANT
NS IRRIG 1000ML POUR BTL (IV SOLUTION) ×3 IMPLANT
PACK HIP COMPR (MISCELLANEOUS) ×3 IMPLANT
SCALPEL PROTECTED #10 DISP (BLADE) ×6 IMPLANT
SHELL ACETABULAR DM  46MM (Shell) ×3 IMPLANT
SOL PREP PVP 2OZ (MISCELLANEOUS) ×3
SOLUTION PREP PVP 2OZ (MISCELLANEOUS) ×1 IMPLANT
SPONGE DRAIN TRACH 4X4 STRL 2S (GAUZE/BANDAGES/DRESSINGS) ×3 IMPLANT
STAPLER SKIN PROX 35W (STAPLE) ×3 IMPLANT
STEM FEMORAL SZ2 STD COLLARED (Stem) ×3 IMPLANT
STRAP SAFETY 5IN WIDE (MISCELLANEOUS) ×3 IMPLANT
SUT DVC 2 QUILL PDO  T11 36X36 (SUTURE) ×2
SUT DVC 2 QUILL PDO T11 36X36 (SUTURE) ×1 IMPLANT
SUT SILK 0 (SUTURE) ×2
SUT SILK 0 30XBRD TIE 6 (SUTURE) ×1 IMPLANT
SUT V-LOC 90 ABS DVC 3-0 CL (SUTURE) ×3 IMPLANT
SUT VIC AB 1 CT1 36 (SUTURE) ×3 IMPLANT
SYR 20CC LL (SYRINGE) ×3 IMPLANT
SYR 30ML LL (SYRINGE) ×3 IMPLANT
SYR BULB IRRIG 60ML STRL (SYRINGE) ×3 IMPLANT
TAPE MICROFOAM 4IN (TAPE) ×3 IMPLANT
TOWEL OR 17X26 4PK STRL BLUE (TOWEL DISPOSABLE) ×3 IMPLANT
TRAY FOLEY MTR SLVR 16FR STAT (SET/KITS/TRAYS/PACK) ×3 IMPLANT
WND VAC CANISTER 500ML (MISCELLANEOUS) ×3 IMPLANT

## 2018-01-24 NOTE — Clinical Social Work Note (Signed)
Clinical Social Work Assessment  Patient Details  Name: Maria [name] MRN: [id number] Date of Birth: [birth year]  Date of referral:  01/24/18               Reason for consult:  Facility Placement                Permission sought to share information with:  Chartered certified accountant granted to share information::  Yes, Verbal Permission Granted  Name::      Maria::   [name]   Relationship::     Contact Information:     Housing/Transportation Living arrangements for the past 2 months:  [place] of Information:  Patient, Adult Children Patient Interpreter Needed:  None Criminal Activity/Legal Involvement Pertinent to Current Situation/Hospitalization:  No - Comment as needed Significant Relationships:  Adult Children Lives with:  Self Do you feel safe going back to the place where you live?  Yes Need for family participation in patient care:  Yes (Comment)  Care giving concerns:  Patient lives at [place] independent living.    Social Worker assessment / plan:  Holiday representative ([place]) reviewed chart and noted that patient has a hip fracture. Patient will have surgery today. PT is pending. CSW met with patient and her daughter Maria [name] [phone] was at bedside. Patient was alert and oriented X4 and was laying in the bed. CSW introduced self and explained role of CSW department. Per patient she lives at [facility] [place] [facility] and wants to go to [facility] for rehab. CSW explained that PT will evaluate patient after surgery and make a recommendation of home health or SNF. CSW also explained that medicare requires a 3 night qualifying inpatient stay in a hospital in order to pay for SNF. Patient was admitted to inpatient 01/24/18. FL2 complete and faxed out to [facility]. Per [facility] admissions coordinator at [facility] they can accept patient. CSW will continue to follow and  assist as needed.    Employment status:  Retired Forensic scientist:  Medicare PT Recommendations:  Not assessed at this time [place] / Referral to community resources:  [place]  Patient/Family's Response to care:  Patient prefers to D/C to [place].   Patient/Family's Understanding of and Emotional Response to Diagnosis, Current Treatment, and Prognosis:  Patient was very pleasant and thanked CSW for assistance.   Emotional Assessment Appearance:  Appears stated age Attitude/Demeanor/Rapport:    Affect (typically observed):  Accepting, Adaptable, Pleasant Orientation:  Oriented to Self, Oriented to Place, Oriented to  Time, Oriented to Situation Alcohol / Substance use:  Not Applicable Psych involvement (Current and /or in the community):  No (Comment)  Discharge Needs  Concerns to be addressed:  Discharge Planning Concerns Readmission within the last 30 days:  No Current discharge risk:  Dependent with Mobility Barriers to Discharge:  Continued Medical Work up   Maria [name], Maria Beets, LCSW 01/24/2018, 5:02 PM

## 2018-01-24 NOTE — ED Provider Notes (Signed)
[facility] Emergency Department Provider Note  ____________________________________________  Time seen: Approximately 9:46 AM  I have reviewed the triage vital signs and the nursing notes.   HISTORY  Chief Complaint Fall    HPI Maria [name] is a 84 y.o. female a history of CHF presenting with right hip pain after fall.  The patient reports that she was wearing boots as part of her Halloween costume yesterday, tripped over a rug and landed on her right hip.  She did not hit her head or lose consciousness and has no other pain.  She was unable to stand up and has been unable to ambulate since the fall.  She denies any numbness or tingling.  She tried 1000 mg of Tylenol with minimal improvement this morning.  Past Medical History:  Diagnosis Date  . Arthritis    fingers, knees  . Cancer ([place])    melanoma  . CHF (congestive heart failure) ([place])   . Complication of anesthesia    Pt reports "coded" during spinal injection during back surgery - 8 yrs ago - [place], [place]  . Diverticulitis   . Environmental and seasonal allergies   . Hypercholesteremia   . Hypertension   . Left arm weakness    positional  . Left bundle branch block (LBBB)   . Motion sickness    seasick  . Seizures ([place]) 06/2013   reaction to exposure to allergan (dogs and cats)  . Vasovagal episode     Patient Active Problem List   Diagnosis Date Noted  . Colonic diverticular abscess 04/02/2016  . AKI (acute kidney injury) ([place]) 12/10/2015  . Diverticulitis of large intestine without perforation or abscess without bleeding 10/17/2015  . Abdominal pain, LLQ (left lower quadrant) 10/11/2015  . Chronic systolic CHF (congestive heart failure), NYHA class 2 ([place]) 03/24/2015  . Benign essential hypertension 03/16/2015  . Hyperlipidemia, mixed 03/16/2015  . Bundle branch block, left 09/01/2014  . Status post total right knee replacement 08/27/2014  . Diverticulosis of large intestine without  hemorrhage 06/08/2014  . Allergic rhinitis due to animal hair and dander 04/30/2014    Past Surgical History:  Procedure Laterality Date  . BACK SURGERY     multiple  . CATARACT EXTRACTION W/ INTRAOCULAR LENS  IMPLANT, BILATERAL    . COLONOSCOPY WITH PROPOFOL N/A 06/27/2016   Procedure: COLONOSCOPY WITH PROPOFOL;  Surgeon: Manya Silvas, MD;  Location: [facility] ENDOSCOPY;  Service: Endoscopy;  Laterality: N/A;  . HAMMERTOE RECONSTRUCTION WITH WEIL OSTEOTOMY Left 08/29/2006  . JOINT REPLACEMENT  08/27/2014   knee  . KNEE ARTHROCENTESIS Left 04/26/2008  . LUMBAR LAMINECTOMY N/A 06/25/2015  . MASS EXCISION Left 12/15/2014   Procedure: MINOR EXCISION OF MASS LEFT INCLUSION CYST;  Surgeon: Beverly Gust, MD;  Location: [facility];  Service: ENT;  Laterality: Left;  NEEDS 2 NURSE  . MELANOMA EXCISION Right 03/14/2016  . ROTATOR CUFF REPAIR Bilateral   . toric lens Left 07/15/12 and 08/05/12    Current Outpatient Rx  . Order #: [id number] Class: Historical Med  . Order #: [id number] Class: Historical Med  . Order #: [id number] Class: Historical Med  . Order #: [id number] Class: Normal  . Order #: [id number] Class: Historical Med  . Order #: [id number] Class: Historical Med  . Order #: [id number] Class: Historical Med  . Order #: [id number] Class: Historical Med  . Order #: [id number] Class: Normal  . Order #: [id number] Class: Print  . Order #: [id number] Class: Print  . Order #: [id number] Class: Historical Med  . Order #: [id number] Class: Historical  Med  . Order #: [id number] Class: Historical Med    Allergies Penicillin g; Sulfa antibiotics; Sulfamethoxazole-trimethoprim; Articaine-epinephrine; Darvon [propoxyphene]; Erythromycin; Guaifenesin; Levofloxacin; Metronidazole; Molds & smuts; Other; Penicillins; Statins; Tetracycline; Erythromycin base; Macrobid [nitrofurantoin]; Spinach; Tape; and Tetracyclines & related  Family History  Problem Relation Age of Onset  . Kidney Stones Mother   . Ovarian  cancer Mother   . Heart disease Mother   . Heart disease Father   . Alzheimer's disease Sister   . Heart disease Maternal Grandmother   . Heart disease Maternal Grandfather   . Heart disease Paternal Grandmother   . Kidney disease Neg Hx     Social History Social History   Tobacco Use  . Smoking status: Never Smoker  . Smokeless tobacco: Never Used  Substance Use Topics  . Alcohol use: No  . Drug use: No    Review of Systems Constitutional: No fever/chills.  Syncope.  Positive mechanical fall. Eyes: No visual changes. ENT: No sore throat. No congestion or rhinorrhea. Cardiovascular: Denies chest pain. Denies palpitations. Respiratory: Denies shortness of breath.  No cough. Gastrointestinal: No abdominal pain.  No nausea, no vomiting.  No diarrhea.  No constipation. Genitourinary: Negative for dysuria. Musculoskeletal: Negative for back pain.  Positive right hip pain.  Negative neck pain. Skin: Negative for rash. Neurological: Negative for headaches. No focal numbness, tingling or weakness.     ____________________________________________   PHYSICAL EXAM:  VITAL SIGNS: ED Triage Vitals  Enc Vitals Group     BP 01/24/18 0932 (!) 198/87     Pulse Rate 01/24/18 0932 68     Resp 01/24/18 0932 16     Temp 01/24/18 0932 98.6 F (37 C)     Temp Source 01/24/18 0932 Oral     SpO2 01/24/18 0932 100 %     Weight 01/24/18 0934 135 lb (61.2 kg)     Height 01/24/18 0934 5\' 3"  (1.6 m)     Head Circumference --      Peak Flow --      Pain Score 01/24/18 0933 0     Pain Loc --      Pain Edu? --      Excl. in [place]? --     Constitutional: Alert and oriented. Answers questions appropriately.[place] 15. Eyes: Conjunctivae are normal.  EOMI. No scleral icterus. Negative raccoon eyes. Head: Atraumatic. No Battle's sign. Nose: No congestion/rhinnorhea. No swelling over the nose or septal hematoma. Mouth/Throat: Mucous membranes are moist.  Dental injury or malocclusion. Neck: No  stridor.  Supple.  Full ROM w/o pain.  No midline cspine ttp, step offs or deformities. Cardiovascular: Normal rate, regular rhythm. No murmurs, rubs or gallops.  Respiratory: Normal respiratory effort.  No accessory muscle use or retractions. Lungs CTAB.  No wheezes, rales or ronchi. Gastrointestinal: Soft, nontender and nondistended.  No guarding or rebound.  No peritoneal signs. Musculoskeletal: Pelvis is stable.  The patient has full range of motion of the bilateral ankles, left knee and left hip.  She is unable to range her left hip due to right groin pain.  She has minimal tenderness over the right greater trochanter but significant pain in the right inguinal crease.  Normal femoral and DP, PT pulses bilaterally.  Cap refill is less than 2 seconds in the feet bilaterally.  Sensation to light touch in the bilateral lower extremities.  No LE edema. No ttp in the calves or palpable cords.  Negative Homan's sign. Neurologic:  A&Ox3.  Speech is clear.  Face and smile are symmetric.  EOMI.  Moves all extremities well. Skin:  Skin is warm, dry and intact. No rash noted. Psychiatric: Mood and affect are normal. Speech and behavior are normal.  Normal judgement.  ____________________________________________   LABS (all labs ordered are listed, but only abnormal results are displayed)  Labs Reviewed  CBC  COMPREHENSIVE METABOLIC PANEL  PROTIME-INR  APTT  URINALYSIS, COMPLETE (UACMP) WITH MICROSCOPIC  TYPE AND SCREEN   ____________________________________________  EKG  ED ECG REPORT I, Anne-Caroline Mariea Clonts, the attending physician, personally viewed and interpreted this ECG.   Date: 01/24/2018  EKG Time: 1008  Rate: 64  Rhythm: normal sinus rhythml LBBB  Axis: normal  Intervals:none  ST&T Change: No STEMI  ____________________________________________  RADIOLOGY  No results found.  ____________________________________________   PROCEDURES  Procedure(s) performed:  None  Procedures  Critical Care performed: No ____________________________________________   INITIAL IMPRESSION / ASSESSMENT AND PLAN / ED COURSE  Pertinent labs & imaging results that were available during my care of the patient were reviewed by me and considered in my medical decision making (see chart for details).  84 y.o. female presenting with right hip pain and inability to ambulate since suffering a mechanical fall yesterday.  Overall, the patient is hypertensive.  I do not see any focal neurologic deficits on my exam.  I am concerned about a hip or pelvic fracture today and an x-ray has been ordered.  The patient defers any additional pain medications at this time.  Plan reevaluation for final disposition.  ----------------------------------------- 10:09 AM on 01/24/2018 -----------------------------------------  The patient's x-ray does show a right femoral neck fracture with impaction.  I have ordered preoperative labs and called orthopedics for surgical consultation.  The patient will be admitted to the hospitalist for continued evaluation and treatment.  ____________________________________________  FINAL CLINICAL IMPRESSION(S) / ED DIAGNOSES  Final diagnoses:  Fall, initial encounter  Closed fracture of neck of right femur, initial encounter ([place])         NEW MEDICATIONS STARTED DURING THIS VISIT:  New Prescriptions   No medications on file      Eula Listen, MD 01/24/18 1512

## 2018-01-24 NOTE — ED Notes (Signed)
Pt currently in xray

## 2018-01-24 NOTE — ED Notes (Signed)
On observation no shortening or rotation of the right left. Pt denied hitting head. Color and pulse in both extremities WNL.   Pt states at 715a she took 2 extra strength tylenol prior to arrival.  Pt states she does not wish to have additional pain medications at this time

## 2018-01-24 NOTE — Anesthesia Post-op Follow-up Note (Signed)
Anesthesia QCDR form completed.        

## 2018-01-24 NOTE — Anesthesia Preprocedure Evaluation (Addendum)
Anesthesia Evaluation  Patient identified by MRN, date of birth, ID band Patient awake    Reviewed: Allergy & Precautions, NPO status , Patient's Chart, lab work & pertinent test results, reviewed documented beta blocker date and time   History of Anesthesia Complications (+) history of anesthetic complications  Airway Mallampati: II  TM Distance: >3 FB     Dental  (+) Chipped   Pulmonary           Cardiovascular hypertension, Pt. on medications +CHF  + dysrhythmias   Echo 39/76/73: MILD LV SYSTOLIC DYSFUNCTION (See above) NORMAL RIGHT VENTRICULAR SYSTOLIC FUNCTION MODERATE VALVULAR REGURGITATION (See above) NO VALVULAR STENOSIS MOD TR MILD MR   Neuro/Psych Seizures -,     GI/Hepatic   Endo/Other    Renal/GU Renal disease     Musculoskeletal  (+) Arthritis ,   Abdominal   Peds  Hematology   Anesthesia Other Findings   Reproductive/Obstetrics                            Anesthesia Physical Anesthesia Plan  ASA: III  Anesthesia Plan: Spinal   Post-op Pain Management:    Induction:   PONV Risk Score and Plan:   Airway Management Planned:   Additional Equipment:   Intra-op Plan:   Post-operative Plan:   Informed Consent: I have reviewed the patients History and Physical, chart, labs and discussed the procedure including the risks, benefits and alternatives for the proposed anesthesia with the patient or authorized representative who has indicated his/her understanding and acceptance.     Plan Discussed with: CRNA  Anesthesia Plan Comments:         Anesthesia Quick Evaluation

## 2018-01-24 NOTE — Op Note (Signed)
01/24/2018  6:06 PM  PATIENT:  Maria [name]  84 y.o. female  PRE-OPERATIVE DIAGNOSIS: Placed right femoral neck fracture with osteoarthritis  POST-OPERATIVE DIAGNOSIS:  right hip fracture same  PROCEDURE:  Procedure(s): TOTAL HIP ARTHROPLASTY ANTERIOR APPROACH (Right)  SURGEON: Laurene Footman, MD  ASSISTANTS: None  ANESTHESIA:   spinal  EBL:  Total I/O In: 1001.7 [IV Piggyback:1001.7] Out: 550 [Urine:200; Blood:350]  BLOOD ADMINISTERED:none  DRAINS: none   LOCAL MEDICATIONS USED:  MARCAINE     SPECIMEN:  Source of Specimen:  Right femoral head and neck  DISPOSITION OF SPECIMEN:  PATHOLOGY  COUNTS:  YES  TOURNIQUET:  * No tourniquets in log *  IMPLANTS: Medacta AMIS 2 standard stem with 46 mm Mpact DM cup and liner with S 22.2 metal head  DICTATION: .Dragon Dictation   The patient was brought to the operating room and after spinal anesthesia was obtained patient was placed on the operative table with the ipsilateral foot into the Medacta attachment, contralateral leg on a well-padded table. C-arm was brought in and preop template x-ray taken. After prepping and draping in usual sterile fashion appropriate patient identification and timeout procedures were completed. Anterior approach to the hip was obtained and centered over the greater trochanter and TFL muscle. The subcutaneous tissue was incised hemostasis being achieved by electrocautery. TFL fascia was incised and the muscle retracted laterally deep retractor placed. The lateral femoral circumflex vessels were identified and ligated. The anterior capsule was exposed and a capsulotomy performed. The neck was identified and a femoral neck cut carried out with a saw, this was done after the initial traction was applied through the Ijeoma's table attachment and with the fracture fairly well aligned up after the femoral neck cut was made the corkscrew could be placed through the neck fragment and into the head and both could be  removed as a single specimen. The head was removed without difficulty and showed sclerotic femoral head and acetabulum. Reaming was carried out to 46 mm and a 46 mm cup trial gave appropriate tightness to the acetabular component a 6 DM cup was impacted into position. The leg was then externally rotated and ischiofemoral and pubofemoral releases carried out. The femur was sequentially broached to a size 2, size 2 standard with S head trials were placed and the final components chosen. The 2 standard stem was inserted along with a total 22.2  mm head and 36 mm liner. The hip was reduced and was stable the wound was thoroughly irrigated with fibrillar placed along the posterior capsule and medial neck. The deep fascia ws closed using a heavy Quill after infiltration of 30 cc of quarter percent Sensorcaine with epinephrine.3-0 V-loc to close the skin with skin staples.  Equal dressing applied and patient was sent to recovery in stable condition.   PLAN OF CARE: Continue as inpatient

## 2018-01-24 NOTE — Clinical Social Work Placement (Signed)
   CLINICAL SOCIAL WORK PLACEMENT  NOTE  Date:  01/24/2018  Patient Details  Name: Maria [name] MRN: [id number] Date of Birth: 01/03/1931  Clinical Social Work is seeking post-discharge placement for this patient at the [place] level of care (*CSW will initial, date and re-position this form in  chart as items are completed):  Yes   Patient/family provided with [place] Work Department's list of facilities offering this level of care within the geographic area requested by the patient (or if unable, by the patient's family).  Yes   Patient/family informed of their freedom to choose among providers that offer the needed level of care, that participate in Medicare, Medicaid or managed care program needed by the patient, have an available bed and are willing to accept the patient.  Yes   Patient/family informed of [facility]'s ownership interest in [facility] and [facility], as well as of the fact that they are under no obligation to receive care at these facilities.  PASRR submitted to EDS on       PASRR number received on       Existing PASRR number confirmed on 01/24/18     FL2 transmitted to all facilities in geographic area requested by pt/family on 01/24/18     FL2 transmitted to all facilities within larger geographic area on       Patient informed that his/her managed care company has contracts with or will negotiate with certain facilities, including the following:        Yes   Patient/family informed of bed offers received.  Patient chooses bed at [facility] )     Physician recommends and patient chooses bed at      Patient to be transferred to   on  .  Patient to be transferred to facility by       Patient family notified on   of transfer.  Name of family member notified:        PHYSICIAN       Additional Comment:    _______________________________________________ Rashunda Passon, Veronia Beets, LCSW 01/24/2018, 5:01  PM

## 2018-01-24 NOTE — H&P (Signed)
[place] [facility] [place] NAME: Maria [name]    MR#:  [id number]  DATE OF BIRTH:  [birth year]  DATE OF ADMISSION:  01/24/2018  PRIMARY CARE PHYSICIAN: Kirk Ruths, MD   REQUESTING/REFERRING PHYSICIAN: Dr. Aundria Rud  CHIEF COMPLAINT:   Chief Complaint  Patient presents with  . Fall    HISTORY OF PRESENT ILLNESS:  Maria [name]  is a 84 y.o. female with a known history of essential hypertension, hyperlipidemia, history of melanoma, history of osteoarthritis who presents to the hospital after a mechanical fall and noted to have a right hip fracture.  Patient fell yesterday at a [place] party at her independent living.  She was able to deal with the pain overnight but this morning she could barely put any weight on her right leg and was dragging it and therefore came to the ER for further evaluation.  In the emergency room patient underwent x-rays of her right hip which showed a subcapital femoral neck fracture.  Hospitalist services were contacted for admission.  Patient denied any prodromal symptoms prior to her fall like any chest pain, shortness of breath palpitations dizziness or syncope.  PAST MEDICAL HISTORY:   Past Medical History:  Diagnosis Date  . Arthritis    fingers, knees  . Cancer ([place])    melanoma  . CHF (congestive heart failure) ([place])   . Complication of anesthesia    Pt reports "coded" during spinal injection during back surgery - 8 yrs ago - [place], [place]  . Diverticulitis   . Environmental and seasonal allergies   . Hypercholesteremia   . Hypertension   . Left arm weakness    positional  . Left bundle branch block (LBBB)   . Motion sickness    seasick  . Seizures ([place]) 06/2013   reaction to exposure to allergan (dogs and cats)  . Vasovagal episode     PAST SURGICAL HISTORY:   Past Surgical History:  Procedure Laterality Date  . BACK SURGERY     multiple  . CATARACT EXTRACTION W/ INTRAOCULAR LENS   IMPLANT, BILATERAL    . COLONOSCOPY WITH PROPOFOL N/A 06/27/2016   Procedure: COLONOSCOPY WITH PROPOFOL;  Surgeon: Manya Silvas, MD;  Location: [facility] ENDOSCOPY;  Service: Endoscopy;  Laterality: N/A;  . HAMMERTOE RECONSTRUCTION WITH WEIL OSTEOTOMY Left 08/29/2006  . JOINT REPLACEMENT  08/27/2014   knee  . KNEE ARTHROCENTESIS Left 04/26/2008  . LUMBAR LAMINECTOMY N/A 06/25/2015  . MASS EXCISION Left 12/15/2014   Procedure: MINOR EXCISION OF MASS LEFT INCLUSION CYST;  Surgeon: Beverly Gust, MD;  Location: [place];  Service: ENT;  Laterality: Left;  NEEDS 2 NURSE  . MELANOMA EXCISION Right 03/14/2016  . ROTATOR CUFF REPAIR Bilateral   . toric lens Left 07/15/12 and 08/05/12    SOCIAL HISTORY:   Social History   Tobacco Use  . Smoking status: Never Smoker  . Smokeless tobacco: Never Used  Substance Use Topics  . Alcohol use: No    FAMILY HISTORY:   Family History  Problem Relation Age of Onset  . Kidney Stones Mother   . Ovarian cancer Mother   . Heart disease Mother   . Heart disease Father   . Alzheimer's disease Sister   . Heart disease Maternal Grandmother   . Heart disease Maternal Grandfather   . Heart disease Paternal Grandmother   . Kidney disease Neg Hx     DRUG ALLERGIES:   Allergies  Allergen Reactions  .  Penicillin G Hives    AGE 62 yo PCN allergy  . Sulfa Antibiotics Other (See Comments) and Anaphylaxis  . Sulfamethoxazole-Trimethoprim Itching, Palpitations, Other (See Comments), Rash and Swelling    Shaking, redness, sneezing  . Articaine-Epinephrine Other (See Comments)  . Darvon [Propoxyphene] Other (See Comments)    Altered mental status  . Erythromycin Other (See Comments)  . Guaifenesin Other (See Comments)    Insomnia and anxiety  . Levofloxacin Other (See Comments)    750mg  dosage after 24 hours caused confusion, off-balanced CNS  . Metronidazole Nausea Only    Abdominal cramping  Gi upset  . Molds & Smuts Cough  . Other  Other (See Comments)    septocaine caused numbness  . Penicillins Swelling  . Statins Other (See Comments)  . Tetracycline Other (See Comments)  . Erythromycin Base Rash    GI upset  . Macrobid [Nitrofurantoin] Rash  . Spinach Palpitations    Irregular heartbeat  . Tape Rash  . Tetracyclines & Related Rash    REVIEW OF SYSTEMS:   Review of Systems  Constitutional: Negative for chills, fever and weight loss.  HENT: Negative for congestion, nosebleeds and tinnitus.   Eyes: Negative for blurred vision, double vision and redness.  Respiratory: Negative for cough, hemoptysis, shortness of breath and wheezing.   Cardiovascular: Negative for chest pain, orthopnea, leg swelling and PND.  Gastrointestinal: Negative for abdominal pain, diarrhea, melena, nausea and vomiting.  Genitourinary: Negative for dysuria, hematuria and urgency.  Musculoskeletal: Positive for falls and joint pain (Right HIp pain).  Neurological: Negative for dizziness, tingling, sensory change, focal weakness, seizures, weakness and headaches.  Endo/Heme/Allergies: Negative for polydipsia. Does not bruise/bleed easily.  Psychiatric/Behavioral: Negative for depression and memory loss. The patient is not nervous/anxious.   All other systems reviewed and are negative.   MEDICATIONS AT HOME:   Prior to Admission medications   Medication Sig Start Date End Date Taking? Authorizing Provider  acetaminophen (TYLENOL) 500 MG tablet Take 1,000 mg by mouth at bedtime as needed for moderate pain.    Yes [provider]  Acidophilus Lactobacillus CAPS Take 1 capsule by mouth daily.    Yes [provider]  brimonidine-timolol (COMBIGAN) 0.2-0.5 % ophthalmic solution Place 1 drop into both eyes every 12 (twelve) hours.   Yes [provider]  carvedilol (COREG) 6.25 MG tablet Take 1 tablet by mouth 2 (two) times daily. 02/27/17  Yes [provider]  Cranberry 450 MG CAPS Take 2 tablets by mouth  daily.   Yes [provider]  latanoprost (XALATAN) 0.005 % ophthalmic solution Place 1 drop into both eyes at bedtime.    Yes [provider]  conjugated estrogens (PREMARIN) vaginal cream Place 1 Applicatorful vaginally daily. Use pea sized amount M-W-Fr before bedtime 01/11/16   Hollice Espy, MD  EPINEPHrine 0.3 mg/0.3 mL IJ SOAJ injection Inject into the muscle once.    [provider]  losartan (COZAAR) 100 MG tablet Take 1 tablet by mouth daily. 01/30/17   [provider]  metoprolol succinate (TOPROL-XL) 25 MG 24 hr tablet Take 0.5 tablets (12.5 mg total) by mouth daily. Patient not taking: Reported on 03/13/2017 12/14/15   Vaughan Basta, MD  metroNIDAZOLE (FLAGYL) 500 MG tablet Take 1 tablet (500 mg total) by mouth 3 (three) times daily. Patient not taking: Reported on 01/24/2018 03/13/17   Earleen Newport, MD  ondansetron (ZOFRAN ODT) 4 MG disintegrating tablet Take 1 tablet (4 mg total) by mouth every 8 (eight)  hours as needed for nausea or vomiting. Patient not taking: Reported on 01/24/2018 03/13/17   Earleen Newport, MD  polyethylene glycol powder [facility]) powder  03/30/16   [provider]  ramipril (ALTACE) 10 MG capsule Take 10 mg by mouth 2 (two) times daily.     [provider]  timolol (TIMOPTIC) 0.5 % ophthalmic solution Place 1 drop into both eyes every morning.    [provider]      VITAL SIGNS:  Blood pressure (!) 198/87, pulse 68, temperature 98.6 F (37 C), temperature source Oral, resp. rate 16, height 5\' 3"  (1.6 m), weight 61.2 kg, SpO2 100 %.  PHYSICAL EXAMINATION:  Physical Exam  GENERAL:  84 y.o.-year-old patient lying in the bed in no acute distress.  EYES: Pupils equal, round, reactive to light and accommodation. No scleral icterus. Extraocular muscles intact.  HEENT: Head atraumatic, normocephalic. Oropharynx and nasopharynx clear. No oropharyngeal erythema, moist oral  mucosa  NECK:  Supple, no jugular venous distention. No thyroid enlargement, no tenderness.  LUNGS: Normal breath sounds bilaterally, no wheezing, rales, rhonchi. No use of accessory muscles of respiration.  CARDIOVASCULAR: S1, S2 RRR. No murmurs, rubs, gallops, clicks.  ABDOMEN: Soft, nontender, nondistended. Bowel sounds present. No organomegaly or mass.  EXTREMITIES: No pedal edema, cyanosis, or clubbing. + 2 pedal & radial pulses b/l.  Right lower extremity is shortened and externally rotated due to the hip fracture. NEUROLOGIC: Cranial nerves II through XII are intact. No focal Motor or sensory deficits appreciated b/l PSYCHIATRIC: The patient is alert and oriented x 3.  SKIN: No obvious rash, lesion, or ulcer.   LABORATORY PANEL:   CBC Recent Labs  Lab 01/24/18 1021  WBC 8.9  HGB 13.3  HCT 41.1  PLT 169   ------------------------------------------------------------------------------------------------------------------  Chemistries  Recent Labs  Lab 01/24/18 1021  NA 134*  K 4.1  CL 98  CO2 24  GLUCOSE 104*  BUN 13  CREATININE 0.66  CALCIUM 9.3  AST 27  ALT 20  ALKPHOS 61  BILITOT 1.2   ------------------------------------------------------------------------------------------------------------------  Cardiac Enzymes No results for input(s): TROPONINI in the last 168 hours. ------------------------------------------------------------------------------------------------------------------  RADIOLOGY:  Dg Chest 1 View  Result Date: 01/24/2018 CLINICAL DATA:  Preop for hip fracture. EXAM: CHEST  1 VIEW COMPARISON:  None. FINDINGS: Cardiomegaly. Increased paramediastinal density superiorly is likely from overlapping osseous structures. There is no edema, consolidation, effusion, or pneumothorax. No visible fracture. IMPRESSION: No evidence of active cardiopulmonary disease. Electronically Signed   By: Monte Fantasia M.D.   On: 01/24/2018 10:11   Dg Hip Unilat  With  Pelvis 2-3 Views Right  Result Date: 01/24/2018 CLINICAL DATA:  Progressively worsening right hip pain after fall yesterday. EXAM: DG HIP (WITH OR WITHOUT PELVIS) 2-3V RIGHT COMPARISON:  CT abdomen pelvis dated March 13, 2017. FINDINGS: There is an acute subcapital fracture of the right femoral neck. No significant displacement. No dislocation. The hip joint spaces are preserved. Degenerative changes of the pubic symphysis. Sacroiliac joints are unremarkable. Osteopenia. Soft tissues are unremarkable. IMPRESSION: 1. Nondisplaced right subcapital femoral neck fracture. Electronically Signed   By: Titus Dubin M.D.   On: 01/24/2018 10:14     IMPRESSION AND PLAN:   84 year old female with past medical history of hypertension, hyperlipidemia, osteoarthritis, history of melanoma, who presented to the hospital after mechanical fall and noted to have a right hip fracture.  1.  Preoperative cardiovascular examination- patient is a low to moderate risk for noncardiac surgery.  No absolute  contraindications to surgery at this time. - Patient's preoperative EKG has been reviewed which shows a left bundle branch block which is chronic for her. - Patient is cleared from the medical perspective to have hip surgery later today.  2.  Status post fall and right hip fracture-secondary to mechanical fall.   -orthopedics has been consulted, they are planning for surgical intervention later today.  Further care as per orthopedics.  3.  Glaucoma-continue Combigan, Timoptic, latanoprost eyedrops.  4.  Essential hypertension-continue carvedilol, losartan, ramipril.    All the records are reviewed and case discussed with ED provider. Management plans discussed with the patient, family and they are in agreement.  CODE STATUS: Full code  TOTAL TIME TAKING CARE OF THIS PATIENT: 45 minutes.    Henreitta Leber M.D on 01/24/2018 at 12:08 PM  Between 7am to 6pm - Pager - [phone]  After 6pm go to  [email or web address] - password [email or web address] [facility]  [facility]  Office  [phone]  CC: Primary care physician; Kirk Ruths, MD

## 2018-01-24 NOTE — Consult Note (Signed)
Patient is a very active 84 year old who was dressed up in a hollowing costume and tried to fence, tripping on her boot and injuring her right hip.  She is an independent ambulator with no assistive device and also drives, takes care of her self.  She has had problems with the hip in the past with pain.  She has a history of prior right total knee replacement.  On exam her right leg is minimally externally rotated and minimally shortened with palpable dorsalis pedis and posterior tib pulses.  She is able to flex extend the toes.  No ecchymosis around the hip skin is intact.  Radiographs show a completely displaced subcapital fracture of the right hip   Impression is displaced femoral neck fracture in a very active 84 year old  Plan is for right total hip replacement for femoral neck fracture and arthritis, risk benefits possible complications discussed.

## 2018-01-24 NOTE — NC FL2 (Signed)
[place] LEVEL OF CARE SCREENING TOOL     IDENTIFICATION  Patient Name: Maria [name] Birthdate: [birth year] Sex: female Admission Date (Current Location): 01/24/2018  [place] and [place] Number:  Engineering geologist and Address:  [facility], [address] [place], [place] [address]      Provider Number: [id number]  Attending Physician Name and Address:  Henreitta Leber, MD  Relative Name and Phone Number:  Desi Carby- daughter [phone]    Current Level of Care: Hospital Recommended Level of Care: [place] Prior Approval Number:    Date Approved/Denied:   PASRR Number: [id number]  Discharge Plan: SNF    Current Diagnoses: Patient Active Problem List   Diagnosis Date Noted  . Hip fracture ([place]) 01/24/2018  . Colonic diverticular abscess 04/02/2016  . AKI (acute kidney injury) ([place]) 12/10/2015  . Diverticulitis of large intestine without perforation or abscess without bleeding 10/17/2015  . Abdominal pain, LLQ (left lower quadrant) 10/11/2015  . Chronic systolic CHF (congestive heart failure), NYHA class 2 ([place]) 03/24/2015  . Benign essential hypertension 03/16/2015  . Hyperlipidemia, mixed 03/16/2015  . Bundle branch block, left 09/01/2014  . Status post total right knee replacement 08/27/2014  . Diverticulosis of large intestine without hemorrhage 06/08/2014  . Allergic rhinitis due to animal hair and dander 04/30/2014    Orientation RESPIRATION BLADDER Height & Weight     Self, Time, Situation, Place  Normal Continent Weight: 135 lb (61.2 kg) Height:  5\' 3"  (160 cm)  BEHAVIORAL SYMPTOMS/MOOD NEUROLOGICAL BOWEL NUTRITION STATUS  (none) (none) Continent Diet(NPO to be advanced )  AMBULATORY STATUS COMMUNICATION OF NEEDS Skin   Extensive Assist Verbally Normal                       Personal Care Assistance Level of Assistance  Bathing, Feeding, Dressing Bathing Assistance: Limited  assistance Feeding assistance: Independent Dressing Assistance: Limited assistance     Functional Limitations Info  Sight, Hearing, Speech Sight Info: Adequate Hearing Info: Adequate Speech Info: Adequate    SPECIAL CARE FACTORS FREQUENCY  PT (By licensed PT), OT (By licensed OT)     PT Frequency: 5 OT Frequency: 5            Contractures Contractures Info: Not present    Additional Factors Info  Code Status, Allergies Code Status Info: Full Code  Allergies Info: Penicillin G, Sulfa Antibiotics, Sulfamethoxazole-trimethoprim, Articaine-epinephrine, Darvon Propoxyphene, Erythromycin, Guaifenesin, Levofloxacin, Metronidazole, Molds & Smuts, Other, Penicillins, Statins, Tetracycline, Erythromycin Base, Macrobid Nitrofurantoin, Spinach, Tape, Tetracyclines & Related           Current Medications (01/24/2018):  This is the current hospital active medication list Current Facility-Administered Medications  Medication Dose Route Frequency Provider Last Rate Last Dose  . [MAR Hold] acetaminophen (TYLENOL) tablet 650 mg  650 mg Oral Q6H PRN Henreitta Leber, MD       Or  . Doug Sou Hold] acetaminophen (TYLENOL) suppository 650 mg  650 mg Rectal Q6H PRN Henreitta Leber, MD      . Doug Sou Hold] acidophilus (RISAQUAD) capsule 1 capsule  1 capsule Oral Daily Sainani, Belia Heman, MD      . Doug Sou Hold] brimonidine (ALPHAGAN) 0.2 % ophthalmic solution 1 drop  1 drop Both Eyes Q12H Sainani, Belia Heman, MD      . Doug Sou Hold] carvedilol (COREG) tablet 6.25 mg  6.25 mg Oral BID Henreitta Leber, MD      . Doug Sou Hold]  clindamycin (CLEOCIN) IVPB 600 mg  600 mg Intravenous Once Hessie Knows, MD      . Doug Sou Hold] fentaNYL (SUBLIMAZE) injection 50 mcg  50 mcg Intravenous Q30 min PRN Eula Listen, MD      . Doug Sou Hold] HYDROcodone-acetaminophen (NORCO/VICODIN) 5-325 MG per tablet 1-2 tablet  1-2 tablet Oral Q4H PRN Henreitta Leber, MD      . Doug Sou Hold] ketorolac (TORADOL) 15 MG/ML injection 15 mg  15 mg  Intravenous Q6H PRN Henreitta Leber, MD      . Doug Sou Hold] latanoprost (XALATAN) 0.005 % ophthalmic solution 1 drop  1 drop Both Eyes QHS Sainani, Belia Heman, MD      . Doug Sou Hold] losartan (COZAAR) tablet 100 mg  100 mg Oral Daily Sainani, Belia Heman, MD      . Doug Sou Hold] ondansetron [facility]) tablet 4 mg  4 mg Oral Q6H PRN Henreitta Leber, MD       Or  . Doug Sou Hold] ondansetron (ZOFRAN) injection 4 mg  4 mg Intravenous Q6H PRN Henreitta Leber, MD      . Doug Sou Hold] ramipril (ALTACE) capsule 10 mg  10 mg Oral BID Henreitta Leber, MD      . Doug Sou Hold] timolol (TIMOPTIC) 0.5 % ophthalmic solution 1 drop  1 drop Both Eyes [id number] Sainani, Belia Heman, MD         Discharge Medications: Please see discharge summary for a list of discharge medications.  Relevant Imaging Results:  Relevant Lab Results:   Additional Information SSN; [id number]  Annamaria Boots, LCSWA

## 2018-01-24 NOTE — Transfer of Care (Signed)
Immediate Anesthesia Transfer of Care Note  Patient: Maria [name]  Procedure(s) Performed: TOTAL HIP ARTHROPLASTY ANTERIOR APPROACH (Right Hip)  Patient Location: PACU  Anesthesia Type:Spinal  Level of Consciousness: awake and alert   Airway & Oxygen Therapy: Patient Spontanous Breathing and Patient connected to face mask oxygen  Post-op Assessment: Report given to RN and Post -op Vital signs reviewed and stable  Post vital signs: Reviewed  Last Vitals:  Vitals Value Taken Time  BP 110/85 01/24/2018  6:06 PM  Temp    Pulse 81 01/24/2018  6:07 PM  Resp 16 01/24/2018  6:07 PM  SpO2 99 % 01/24/2018  6:07 PM  Vitals shown include unvalidated device data.  Last Pain:  Vitals:   01/24/18 0933  TempSrc:   PainSc: 0-No pain         Complications: No apparent anesthesia complications

## 2018-01-24 NOTE — ED Triage Notes (Signed)
Pt arrives via ems from [facility]. Ems reports pt fell around 2pm yesterday. Pain progressively getting worse through night. Unable to bear weight on the right leg. Pt states boots she had put on grabbed carpet causing pt to fall. Ems states no rotation or shortening of the right leg, no LOC reported. Pt denies hitting head. A&o x 4 on arrival

## 2018-01-24 NOTE — Anesthesia Procedure Notes (Signed)
Spinal  Patient location during procedure: OR Staffing Anesthesiologist: Gunnar Bulla, MD Resident/CRNA: Rolla Plate, CRNA Performed: resident/CRNA  Preanesthetic Checklist Completed: patient identified, site marked, surgical consent, pre-op evaluation, timeout performed, IV checked, risks and benefits discussed and monitors and equipment checked Spinal Block Patient position: left lateral decubitus Prep: ChloraPrep and site prepped and draped Patient monitoring: heart rate, continuous pulse ox, blood pressure and cardiac monitor Approach: midline Location: L4-5 Injection technique: single-shot Needle Needle type: Introducer and Pencan  Needle gauge: 24 G Needle length: 9 cm Additional Notes Negative paresthesia. Negative blood return. Positive free-flowing CSF. Expiration date of kit checked and confirmed. Patient tolerated procedure well, without complications.

## 2018-01-25 ENCOUNTER — Inpatient Hospital Stay: Payer: Medicare Other

## 2018-01-25 LAB — BASIC METABOLIC PANEL
ANION GAP: 6 (ref 5–15)
BUN: 11 mg/dL (ref 8–23)
CO2: 25 mmol/L (ref 22–32)
CREATININE: 0.59 mg/dL (ref 0.44–1.00)
Calcium: 8.4 mg/dL — ABNORMAL LOW (ref 8.9–10.3)
Chloride: 105 mmol/L (ref 98–111)
GFR calc Af Amer: >60 mL/min (ref >60)
GLUCOSE: 139 mg/dL — AB (ref 70–99)
Potassium: 3.9 mmol/L (ref 3.5–5.1)
Sodium: 136 mmol/L (ref 135–145)

## 2018-01-25 LAB — CBC
HCT: 35.1 % — ABNORMAL LOW (ref 36.0–46.0)
Hemoglobin: 11.3 g/dL — ABNORMAL LOW (ref 12.0–15.0)
MCH: 30.3 pg (ref 26.0–34.0)
MCHC: 32.2 g/dL (ref 30.0–36.0)
MCV: 94.1 fL (ref 80.0–100.0)
PLATELETS: 193 10*3/uL (ref 150–400)
RBC: 3.73 MIL/uL — ABNORMAL LOW (ref 3.87–5.11)
RDW: 12.8 % (ref 11.5–15.5)
WBC: 8.6 10*3/uL (ref 4.0–10.5)
nRBC: 0 % (ref 0.0–0.2)

## 2018-01-25 NOTE — Evaluation (Signed)
Physical Therapy Evaluation Patient Details Name: Maria [name] MRN: [id number] DOB: 1930/06/27 Today's Date: 01/25/2018   History of Present Illness  Pt. is an 84 y.o. female who was admitted to [facility] for an Anterior Approach THA of a Right Femoral Neck Fracture. PMH includes vasovagal episode, seizures, LBBB, L arm weakness, Htn, Hcl, CA and arthritis.  Clinical Impression  Pt is an 84 year old female who lives at [place] alone.  She is independent with occasional use of 4WW at baseline.  Pt's family does not feel this walker is safe for pt to use.  She is in bed and reporting pain in R hip and L foot when PT arrived.  PT required max A for most bed mobility and requests that support be given beneath the knee as opposed to her LL.  She was able to sit at bedside with poor sitting balance and report of pain.  Pt presented with poor to fair strength of UE's and LE's with pain limiting most LE motion.  Pt required 2+ assist to scoot up in bed but was able to assist with UE's and partially with L LE.  Pt able to demonstrate understanding of HEP exercises and perform with manual assistance.  Pt will continue to benefit from skilled PT with focus on strength, pain management and safe functional mobility.    Follow Up Recommendations SNF    Equipment Recommendations  Rolling walker with 5" wheels(TBD at next venue of care.)    Recommendations for Other Services       Precautions / Restrictions Precautions Precautions: Anterior Hip Restrictions Weight Bearing Restrictions: Yes RLE Weight Bearing: Weight bearing as tolerated      Mobility  Bed Mobility Overal bed mobility: Needs Assistance Bed Mobility: Supine to Sit;Sit to Supine     Supine to sit: Mod assist Sit to supine: Max assist   General bed mobility comments: Requires support beneath knee for R LE and assistance to get back into bed.  +2 assist to scoot up in bed.  Transfers Overall transfer level: (Deferred due to pt not having  boot in room and bed not working to elevate.)                  Ambulation/Gait                Stairs            Wheelchair Mobility    Modified Rankin (Stroke Patients Only)       Balance Overall balance assessment: Mild deficits observed, not formally tested                                           Pertinent Vitals/Pain Pain Assessment: 0-10 Pain Score: 2  Pain Location: R hip, L foot Pain Intervention(s): Monitored during session;Limited activity within patient's tolerance    Home Living Family/patient expects to be discharged to:: Skilled nursing facility Living Arrangements: Alone Available Help at Discharge: Family Type of Home: Independent living facility(Resides in a cottage at [employer]) Home Access: Level entry     Home Layout: One level Home Equipment: Clinical cytogeneticist - 4 wheels      Prior Function Level of Independence: Independent         Comments: Independent with ADLs, and IADLs. Pt. is active in the community. Has used 4WW in the past but family does not believe this is  safe.     Hand Dominance   Dominant Hand: Right    Extremity/Trunk Assessment   Upper Extremity Assessment Upper Extremity Assessment: Generalized weakness    Lower Extremity Assessment Lower Extremity Assessment: Overall WFL for tasks assessed(Grossly 4/5 when not limited by pain.)    Cervical / Trunk Assessment Cervical / Trunk Assessment: Kyphotic  Communication   Communication: No difficulties  Cognition Arousal/Alertness: Awake/alert Behavior During Therapy: WFL for tasks assessed/performed Overall Cognitive Status: Within Functional Limits for tasks assessed                                        General Comments      Exercises Total Joint Exercises Ankle Circles/Pumps: 20 reps;Both;Supine Quad Sets: Right;10 reps;Supine Straight Leg Raises: AAROM;Both;5 reps   Assessment/Plan    PT Assessment  Patient needs continued PT services  PT Problem List Decreased strength;Decreased mobility;Decreased balance;Decreased activity tolerance;Pain       PT Treatment Interventions DME instruction;Therapeutic activities;Gait training;Therapeutic exercise;Patient/family education;Balance training;Functional mobility training    PT Goals (Current goals can be found in the Care Plan section)  Acute Rehab PT Goals Patient Stated Goal: To regain independence PT Goal Formulation: With patient/family Time For Goal Achievement: 02/08/18 Potential to Achieve Goals: Good    Frequency BID   Barriers to discharge        Co-evaluation               AM-PAC PT "6 Clicks" Daily Activity  Outcome Measure Difficulty turning over in bed (including adjusting bedclothes, sheets and blankets)?: Unable Difficulty moving from lying on back to sitting on the side of the bed? : Unable Difficulty sitting down on and standing up from a chair with arms (e.g., wheelchair, bedside commode, etc,.)?: Unable Help needed moving to and from a bed to chair (including a wheelchair)?: A Lot Help needed walking in hospital room?: A Lot Help needed climbing 3-5 steps with a railing? : A Lot 6 Click Score: 9    End of Session Equipment Utilized During Treatment: Oxygen Activity Tolerance: Patient limited by pain Patient left: in bed;with nursing/sitter in room;with family/visitor present;with call bell/phone within reach Nurse Communication: Mobility status PT Visit Diagnosis: Unsteadiness on feet (R26.81);Muscle weakness (generalized) (M62.81);Pain Pain - Right/Left: Right Pain - part of body: Hip    Time: 1550-1615 PT Time Calculation (min) (ACUTE ONLY): 25 min   Charges:   PT Evaluation $PT Eval Low Complexity: 1 Low PT Treatments $Therapeutic Exercise: 8-22 mins        Roxanne Gates, PT, DPT    Roxanne Gates 01/25/2018, 5:26 PM

## 2018-01-25 NOTE — Evaluation (Signed)
Occupational Therapy Evaluation Patient Details Name: Maria [name] MRN: [id number] DOB: 07-07-1930 Today's Date: 01/25/2018    History of Present Illness Pt. is an 84 y.o. female who was admitted to [facility] for an Anterior Approach THA of a Right Femoral Neck Fracture.    Clinical Impression   Pt. Presents with right LE pain, weakness, limited activity tolerance, and limited functional mobility which limits the ability to complete basic ADL and IADL functioning. Pt. resides at home alone in a cottage at [employer] [facility]. Pt. was independent with ADLs, and IADL functioning: including meal preparation, and medication management. Pt. education was provided about LE dressing skills, and positioning. Pt. could benefit from OT services for ADL training, A/E training, and pt. Education about home modification, and DME. Pt. plans to go to SNF at the [facility] upon discharge. Pt. could benefit  From follow-up OT services upon discharge.    Follow Up Recommendations  SNF    Equipment Recommendations  3 in 1 bedside commode    Recommendations for Other Services       Precautions / Restrictions Precautions Precautions: Anterior Hip Restrictions Weight Bearing Restrictions: Yes RLE Weight Bearing: Weight bearing as tolerated      Mobility Bed Mobility    Pt. Seen for bedside eval              Transfers                      Balance                                           ADL either performed or assessed with clinical judgement   ADL Overall ADL's : Needs assistance/impaired Eating/Feeding: Independent;Set up;Bed level   Grooming: Set up;Bed level;Independent   Upper Body Bathing: Set up;Minimal assistance;Bed level   Lower Body Bathing: Set up;Moderate assistance;Bed level   Upper Body Dressing : Set up;Bed level;Minimal assistance   Lower Body Dressing: Set up;Maximal assistance;Bed level                 General ADL Comments: Pt.  education was provided about A/E use for LE ADLs,     Vision Baseline Vision/History: Glaucoma(Cataract surgery several years ago.) Patient Visual Report: No change from baseline       Perception     Praxis      Pertinent Vitals/Pain Pain Assessment: 0-10 Pain Score: 2  Pain Descriptors / Indicators: Aching Pain Intervention(s): Monitored during session;Limited activity within patient's tolerance;Premedicated before session     Hand Dominance Right   Extremity/Trunk Assessment Upper Extremity Assessment Upper Extremity Assessment: Generalized weakness(RUE shoulder flexion3-/5, elbow flexion, extension 4-/5. LUE WFL.)           Communication Communication Communication: No difficulties   Cognition Arousal/Alertness: Awake/alert Behavior During Therapy: WFL for tasks assessed/performed Overall Cognitive Status: Within Functional Limits for tasks assessed                                     General Comments       Exercises     Shoulder Instructions      Home Living Family/patient expects to be discharged to:: Skilled nursing facility Living Arrangements: Alone Available Help at Discharge: Family Type of Home: Independent living facility(Resides in a cottage  at [place]) Home Access: Level entry     Home Layout: One level     Bathroom Shower/Tub: Occupational psychologist: Standard     Home Equipment: Shower seat          Prior Functioning/Environment Level of Independence: Independent        Comments: Independent with ADLs, and IADLs. Pt. is active in the community.         OT Problem List: Decreased strength;Decreased activity tolerance;Decreased range of motion;Pain;Impaired UE functional use;Decreased knowledge of use of DME or AE;Impaired vision/perception      OT Treatment/Interventions: Self-care/ADL training;Therapeutic exercise;Patient/family education;DME and/or AE instruction;Therapeutic  activities;Visual/perceptual remediation/compensation;Energy conservation    OT Goals(Current goals can be found in the care plan section) Acute Rehab OT Goals Patient Stated Goal: To return home OT Goal Formulation: With patient Potential to Achieve Goals: Good  OT Frequency: Min 2X/week   Barriers to D/C:            Co-evaluation              AM-PAC PT "6 Clicks" Daily Activity     Outcome Measure Help from another person eating meals?: None Help from another person taking care of personal grooming?: None Help from another person toileting, which includes using toliet, bedpan, or urinal?: A Little Help from another person bathing (including washing, rinsing, drying)?: A Lot Help from another person to put on and taking off regular upper body clothing?: A Little Help from another person to put on and taking off regular lower body clothing?: A Lot 6 Click Score: 18   End of Session    Activity Tolerance: Patient tolerated treatment well Patient left: in bed;with call bell/phone within reach;with bed alarm set  OT Visit Diagnosis: Muscle weakness (generalized) (M62.81);History of falling (Z91.81)                Time: 8127-5170 OT Time Calculation (min): 30 min Charges:  OT General Charges $OT Visit: 1 Visit OT Evaluation $OT Eval Moderate Complexity: 1 Mod Harrel Carina, MS, OTR/L  Harrel Carina 01/25/2018, 12:33 PM

## 2018-01-25 NOTE — Progress Notes (Signed)
  Subjective: 1 Day Post-Op Procedure(s) (LRB): TOTAL HIP ARTHROPLASTY ANTERIOR APPROACH (Right) Patient reports pain as mild.   Patient is well but is complaining of moderate lateral left foot pain PT and care management to assist with discharge planning. Negative for chest pain and shortness of breath Fever: no Gastrointestinal:Negative for nausea and vomiting  Objective: Vital signs in last 24 hours: Temp:  [97.5 F (36.4 C)-98.8 F (37.1 C)] 97.8 F (36.6 C) (11/02 0737) Pulse Rate:  [57-82] 57 (11/02 0737) Resp:  [12-21] 19 (11/02 0006) BP: (106-211)/(48-91) 128/62 (11/02 0737) SpO2:  [96 %-100 %] 100 % (11/02 0737) FiO2 (%):  [28 %] 28 % (11/01 2124) Weight:  [61.2 kg] 61.2 kg (11/01 0934)  Intake/Output from previous day:  Intake/Output Summary (Last 24 hours) at 01/25/2018 0842 Last data filed at 01/24/2018 1949 Gross per 24 hour  Intake 1601.72 ml  Output 625 ml  Net 976.72 ml    Intake/Output this shift: No intake/output data recorded.  Labs: Recent Labs    01/24/18 1021 01/25/18 0433  HGB 13.3 11.3*   Recent Labs    01/24/18 1021 01/25/18 0433  WBC 8.9 8.6  RBC 4.40 3.73*  HCT 41.1 35.1*  PLT 169 193   Recent Labs    01/24/18 1021 01/25/18 0433  NA 134* 136  K 4.1 3.9  CL 98 105  CO2 24 25  BUN 13 11  CREATININE 0.66 0.59  GLUCOSE 104* 139*  CALCIUM 9.3 8.4*   Recent Labs    01/24/18 1021  INR 0.95     EXAM General - Patient is Alert, Appropriate and Oriented Extremity - ABD soft Neurovascular intact Sensation intact distally Intact pulses distally Dorsiflexion/Plantar flexion intact Incision: dressing C/D/I No cellulitis present Dressing/Incision - clean, dry, no drainage Motor Function - intact, moving foot and toes well on exam.  Patient with moderate pain with palpation over the 5th metatarsal. Abdomen is soft with normal BS.  Past Medical History:  Diagnosis Date  . Arthritis    fingers, knees  . Cancer ([place])    melanoma  . CHF (congestive heart failure) ([place])   . Complication of anesthesia    Pt reports "coded" during spinal injection during back surgery - 8 yrs ago - [place], [place]  . Diverticulitis   . Environmental and seasonal allergies   . Hypercholesteremia   . Hypertension   . Left arm weakness    positional  . Left bundle branch block (LBBB)   . Motion sickness    seas[place]  . Seizures ([place]) 06/2013   reaction to exposure to allergan (dogs and cats)  . Vasovagal episode     Assessment/Plan: 1 Day Post-Op Procedure(s) (LRB): TOTAL HIP ARTHROPLASTY ANTERIOR APPROACH (Right) Active Problems:   Hip fracture (HCC)  Estimated body mass index is 23.91 kg/m as calculated from the following:   Height as of this encounter: 5\' 3"  (1.6 m).   Weight as of this encounter: 61.2 kg. Advance diet Up with therapy D/C IV fluids  When tolerating po intake.  Labs reviewed today, 11.3 Hg this AM. X-rays of the left foot ordered today, ordered CAM walker boot to wear with ambulation if needed for pain. Up with therapy today. Pt is passing gas, continue to work on BM.  DVT Prophylaxis - Aspirin, Foot Pumps and TED hose Weight-Bearing as tolerated to right leg  J. Cameron Proud, PA-C [facility] Orthopaedic Surgery 01/25/2018, 8:42 AM

## 2018-01-26 MED ORDER — FLEET ENEMA 7-19 GM/118ML RE ENEM: 1.0000 | ENEMA | Freq: Every day | RECTAL | Status: DC | PRN

## 2018-01-26 NOTE — Progress Notes (Signed)
  Subjective: 2 Days Post-Op Procedure(s) (LRB): TOTAL HIP ARTHROPLASTY ANTERIOR APPROACH (Right)  Left foot pain. Patient reports pain as mild to the right hip.  Patient is well but is complaining of moderate lateral left foot pain PT and care management to assist with discharge planning, current plan is for discharge to SNF tomorrow. Negative for chest pain and shortness of breath Fever: no Gastrointestinal:Negative for nausea and vomiting  Objective: Vital signs in last 24 hours: Temp:  [98 F (36.7 C)-99 F (37.2 C)] 98 F (36.7 C) (11/03 0735) Pulse Rate:  [62-77] 74 (11/03 0735) Resp:  [17-19] 19 (11/02 2349) BP: (125-142)/(53-65) 142/65 (11/03 0735) SpO2:  [94 %-100 %] 97 % (11/03 0735)  Intake/Output from previous day:  Intake/Output Summary (Last 24 hours) at 01/26/2018 0844 Last data filed at 01/25/2018 1615 Gross per 24 hour  Intake 240 ml  Output 200 ml  Net 40 ml    Intake/Output this shift: No intake/output data recorded.  Labs: Recent Labs    01/24/18 1021 01/25/18 0433  HGB 13.3 11.3*   Recent Labs    01/24/18 1021 01/25/18 0433  WBC 8.9 8.6  RBC 4.40 3.73*  HCT 41.1 35.1*  PLT 169 193   Recent Labs    01/24/18 1021 01/25/18 0433  NA 134* 136  K 4.1 3.9  CL 98 105  CO2 24 25  BUN 13 11  CREATININE 0.66 0.59  GLUCOSE 104* 139*  CALCIUM 9.3 8.4*   Recent Labs    01/24/18 1021  INR 0.95     EXAM General - Patient is Alert, Appropriate and Oriented Extremity - ABD soft Neurovascular intact Sensation intact distally Intact pulses distally Dorsiflexion/Plantar flexion intact Incision: dressing C/D/I No cellulitis present Dressing/Incision - clean, dry, no drainage Motor Function - intact, moving foot and toes well on exam.  Patient with moderate pain with palpation over the 5th metatarsal. Abdomen is soft with normal BS.  Past Medical History:  Diagnosis Date  . Arthritis    fingers, knees  . Cancer ([place])    melanoma  .  CHF (congestive heart failure) ([place])   . Complication of anesthesia    Pt reports "coded" during spinal injection during back surgery - 8 yrs ago - [place], [place]  . Diverticulitis   . Environmental and seasonal allergies   . Hypercholesteremia   . Hypertension   . Left arm weakness    positional  . Left bundle branch block (LBBB)   . Motion sickness    [place]  . Seizures ([place]) 06/2013   reaction to exposure to allergan (dogs and cats)  . Vasovagal episode     Assessment/Plan: 2 Days Post-Op Procedure(s) (LRB): TOTAL HIP ARTHROPLASTY ANTERIOR APPROACH (Right) Active Problems:   Hip fracture (HCC)  Estimated body mass index is 23.91 kg/m as calculated from the following:   Height as of this encounter: 5\' 3"  (1.6 m).   Weight as of this encounter: 61.2 kg. Advance diet Up with therapy D/C IV fluids  When tolerating po intake.  X-rays of the left foot were obtained yesterday and negative for a fracture.  Will place in boot today for ambulation purposes. Up with therapy today. Pt is passing gas, continue to work on BM.  FLEET enema ordered. Probable discharge to SNF tomorrow.  DVT Prophylaxis - Aspirin, Foot Pumps and TED hose Weight-Bearing as tolerated to right leg  J. Cameron Proud, PA-C [facility] Orthopaedic Surgery 01/26/2018, 8:44 AM

## 2018-01-26 NOTE — Progress Notes (Signed)
PT Cancellation Note  Patient Details Name: Maria [name] MRN: [id number] DOB: [birth year]   Cancelled Treatment:    Reason Eval/Treat Not Completed: Patient declined, no reason specified.  Pt declined, stating that she will need more pain medication prior to treatment.  Pt's walking boot is also not present in room at this time.  Will re-attempt when pt is more appropriate.   Roxanne Gates, PT, DPT 01/26/2018, 8:43 AM

## 2018-01-26 NOTE — Plan of Care (Signed)
  Problem: Health Behavior/Discharge Planning: ?Goal: Ability to manage health-related needs will improve ?Outcome: Progressing ?  ?Problem: Clinical Measurements: ?Goal: Will remain free from infection ?Outcome: Progressing ?  ?Problem: Activity: ?Goal: Risk for activity intolerance will decrease ?Outcome: Progressing ?  ?Problem: Elimination: ?Goal: Will not experience complications related to bowel motility ?Outcome: Progressing ?  ?Problem: Skin Integrity: ?Goal: Risk for impaired skin integrity will decrease ?Outcome: Progressing ?  ?

## 2018-01-27 ENCOUNTER — Encounter: Payer: Self-pay | Admitting: Orthopedic Surgery

## 2018-01-27 ENCOUNTER — Encounter
Admission: RE | Admit: 2018-01-27 | Discharge: 2018-01-27 | Disposition: A | Discharge status: Home / Self Care | Payer: Medicare Other | Source: Ambulatory Visit | Attending: Internal Medicine | Admitting: Internal Medicine

## 2018-01-27 DIAGNOSIS — Z79899 Other long term (current) drug therapy: Secondary | ICD-10-CM | POA: Insufficient documentation

## 2018-01-27 DIAGNOSIS — Z91048 Other nonmedicinal substance allergy status: Secondary | ICD-10-CM | POA: Insufficient documentation

## 2018-01-27 DIAGNOSIS — Z85820 Personal history of malignant melanoma of skin: Secondary | ICD-10-CM | POA: Insufficient documentation

## 2018-01-27 DIAGNOSIS — Z8249 Family history of ischemic heart disease and other diseases of the circulatory system: Secondary | ICD-10-CM | POA: Insufficient documentation

## 2018-01-27 DIAGNOSIS — M1A9XX0 Chronic gout, unspecified, without tophus (tophi): Secondary | ICD-10-CM | POA: Insufficient documentation

## 2018-01-27 DIAGNOSIS — I447 Left bundle-branch block, unspecified: Secondary | ICD-10-CM | POA: Insufficient documentation

## 2018-01-27 DIAGNOSIS — Z96659 Presence of unspecified artificial knee joint: Secondary | ICD-10-CM | POA: Insufficient documentation

## 2018-01-27 DIAGNOSIS — I5022 Chronic systolic (congestive) heart failure: Secondary | ICD-10-CM | POA: Insufficient documentation

## 2018-01-27 DIAGNOSIS — Z96641 Presence of right artificial hip joint: Secondary | ICD-10-CM | POA: Insufficient documentation

## 2018-01-27 DIAGNOSIS — Z7982 Long term (current) use of aspirin: Secondary | ICD-10-CM | POA: Insufficient documentation

## 2018-01-27 DIAGNOSIS — K5909 Other constipation: Secondary | ICD-10-CM | POA: Insufficient documentation

## 2018-01-27 DIAGNOSIS — Z886 Allergy status to analgesic agent status: Secondary | ICD-10-CM | POA: Insufficient documentation

## 2018-01-27 DIAGNOSIS — Z8041 Family history of malignant neoplasm of ovary: Secondary | ICD-10-CM | POA: Insufficient documentation

## 2018-01-27 DIAGNOSIS — M19049 Primary osteoarthritis, unspecified hand: Secondary | ICD-10-CM | POA: Insufficient documentation

## 2018-01-27 DIAGNOSIS — Z882 Allergy status to sulfonamides status: Secondary | ICD-10-CM | POA: Insufficient documentation

## 2018-01-27 DIAGNOSIS — Z880 Allergy status to penicillin: Secondary | ICD-10-CM | POA: Insufficient documentation

## 2018-01-27 DIAGNOSIS — H409 Unspecified glaucoma: Secondary | ICD-10-CM | POA: Insufficient documentation

## 2018-01-27 DIAGNOSIS — M1710 Unilateral primary osteoarthritis, unspecified knee: Secondary | ICD-10-CM | POA: Insufficient documentation

## 2018-01-27 DIAGNOSIS — Z881 Allergy status to other antibiotic agents status: Secondary | ICD-10-CM | POA: Insufficient documentation

## 2018-01-27 DIAGNOSIS — S72011D Unspecified intracapsular fracture of right femur, subsequent encounter for closed fracture with routine healing: Secondary | ICD-10-CM | POA: Insufficient documentation

## 2018-01-27 DIAGNOSIS — Z91018 Allergy to other foods: Secondary | ICD-10-CM | POA: Insufficient documentation

## 2018-01-27 DIAGNOSIS — Z888 Allergy status to other drugs, medicaments and biological substances status: Secondary | ICD-10-CM | POA: Insufficient documentation

## 2018-01-27 DIAGNOSIS — Z884 Allergy status to anesthetic agent status: Secondary | ICD-10-CM | POA: Insufficient documentation

## 2018-01-27 DIAGNOSIS — I110 Hypertensive heart disease with heart failure: Secondary | ICD-10-CM | POA: Insufficient documentation

## 2018-01-27 LAB — CBC
HEMATOCRIT: 30.7 % — AB (ref 36.0–46.0)
HEMOGLOBIN: 10.3 g/dL — AB (ref 12.0–15.0)
MCH: 31.2 pg (ref 26.0–34.0)
MCHC: 33.6 g/dL (ref 30.0–36.0)
MCV: 93 fL (ref 80.0–100.0)
Platelets: 169 10*3/uL (ref 150–400)
RBC: 3.3 MIL/uL — ABNORMAL LOW (ref 3.87–5.11)
RDW: 12.7 % (ref 11.5–15.5)
WBC: 7.2 10*3/uL (ref 4.0–10.5)
nRBC: 0 % (ref 0.0–0.2)

## 2018-01-27 MED ORDER — ASPIRIN 81 MG PO CHEW: 81.0000 mg | CHEWABLE_TABLET | Freq: Two times a day (BID) | ORAL | 0 refills | Status: DC

## 2018-01-27 MED ORDER — HYDROCODONE-ACETAMINOPHEN 5-325 MG PO TABS: 1.0000 | ORAL_TABLET | ORAL | 0 refills | Status: DC | PRN

## 2018-01-27 MED ORDER — MAGNESIUM HYDROXIDE 400 MG/5ML PO SUSP: 30.0000 mL | Freq: Every day | ORAL | 0 refills | Status: DC | PRN

## 2018-01-27 MED ORDER — DOCUSATE SODIUM 100 MG PO CAPS: 100.0000 mg | ORAL_CAPSULE | Freq: Two times a day (BID) | ORAL | 0 refills | Status: DC

## 2018-01-27 MED ORDER — METHOCARBAMOL 500 MG PO TABS: 500.0000 mg | ORAL_TABLET | Freq: Four times a day (QID) | ORAL | 0 refills | Status: DC | PRN

## 2018-01-27 NOTE — Progress Notes (Signed)
[place] [facility] [place] NAME: Wynetta Seith    MR#:  [id number]  DATE OF BIRTH:  06-03-1930  SUBJECTIVE:  CHIEF COMPLAINT:   Chief Complaint  Patient presents with  . Fall   Came after a fall and have hip fracture- s/p surgery, no complains. REVIEW OF SYSTEMS:  CONSTITUTIONAL: No fever, fatigue or weakness.  EYES: No blurred or double vision.  EARS, NOSE, AND THROAT: No tinnitus or ear pain.  RESPIRATORY: No cough, shortness of breath, wheezing or hemoptysis.  CARDIOVASCULAR: No chest pain, orthopnea, edema.  GASTROINTESTINAL: No nausea, vomiting, diarrhea or abdominal pain.  GENITOURINARY: No dysuria, hematuria.  ENDOCRINE: No polyuria, nocturia,  HEMATOLOGY: No anemia, easy bruising or bleeding SKIN: No rash or lesion. MUSCULOSKELETAL: right hip pain.   NEUROLOGIC: No tingling, numbness, weakness.  PSYCHIATRY: No anxiety or depression.   ROS  DRUG ALLERGIES:   Allergies  Allergen Reactions  . Penicillin G Hives    AGE 84 yo PCN allergy  . Sulfa Antibiotics Other (See Comments) and Anaphylaxis  . Sulfamethoxazole-Trimethoprim Itching, Palpitations, Other (See Comments), Rash and Swelling    Shaking, redness, sneezing  . Articaine-Epinephrine Other (See Comments)  . Darvon [Propoxyphene] Other (See Comments)    Altered mental status  . Erythromycin Other (See Comments)  . Guaifenesin Other (See Comments)    Insomnia and anxiety  . Levofloxacin Other (See Comments)    750mg  dosage after 24 hours caused confusion, off-balanced CNS  . Metronidazole Nausea Only    Abdominal cramping  Gi upset  . Molds & Smuts Cough  . Other Other (See Comments)    septocaine caused numbness  . Penicillins Swelling  . Statins Other (See Comments)  . Tetracycline Other (See Comments)  . Erythromycin Base Rash    GI upset  . Macrobid [Nitrofurantoin] Rash  . Spinach Palpitations    Irregular heartbeat  . Tape Rash  . Tetracyclines & Related  Rash    VITALS:  Blood pressure (!) 157/71, pulse 77, temperature (!) 100.5 F (38.1 C), temperature source Oral, resp. rate 18, height 5\' 3"  (1.6 m), weight 61.2 kg, SpO2 99 %.  PHYSICAL EXAMINATION:  GENERAL:  84 y.o.-year-old patient lying in the bed with no acute distress.  EYES: Pupils equal, round, reactive to light and accommodation. No scleral icterus. Extraocular muscles intact.  HEENT: Head atraumatic, normocephalic. Oropharynx and nasopharynx clear.  NECK:  Supple, no jugular venous distention. No thyroid enlargement, no tenderness.  LUNGS: Normal breath sounds bilaterally, no wheezing, rales,rhonchi or crepitation. No use of accessory muscles of respiration.  CARDIOVASCULAR: S1, S2 normal. No murmurs, rubs, or gallops.  ABDOMEN: Soft, nontender, nondistended. Bowel sounds present. No organomegaly or mass.  EXTREMITIES: No pedal edema, cyanosis, or clubbing.  NEUROLOGIC: Cranial nerves II through XII are intact. Muscle strength 4/5 in all extremities except RLL due to post surgery pain. Sensation intact. Gait not checked.  PSYCHIATRIC: The patient is alert and oriented x 3.  SKIN: No obvious rash, lesion, or ulcer.   Physical Exam LABORATORY PANEL:   CBC Recent Labs  Lab 01/27/18 0347  WBC 7.2  HGB 10.3*  HCT 30.7*  PLT 169   ------------------------------------------------------------------------------------------------------------------  Chemistries  Recent Labs  Lab 01/24/18 1021 01/25/18 0433  NA 134* 136  K 4.1 3.9  CL 98 105  CO2 24 25  GLUCOSE 104* 139*  BUN 13 11  CREATININE 0.66 0.59  CALCIUM 9.3 8.4*  AST 27  --   ALT  20  --   ALKPHOS 61  --   BILITOT 1.2  --    ------------------------------------------------------------------------------------------------------------------  Cardiac Enzymes No results for input(s): TROPONINI in the last 168  hours. ------------------------------------------------------------------------------------------------------------------  RADIOLOGY:  No results found.  ASSESSMENT AND PLAN:   Active Problems:   Hip fracture [facility])  84 year old female with past medical history of hypertension, hyperlipidemia, osteoarthritis, history of melanoma, who presented to the hospital after mechanical fall and noted to have a right hip fracture.  *   Status post fall and right hip fracture-secondary to mechanical fall.   - s/p right total hip arthroplasty 01/24/18  *  Glaucoma-continue Combigan, Timoptic, latanoprost eyedrops.  *  Essential hypertension-continue carvedilol, losartan, ramipril.    All the records are reviewed and case discussed with Care Management/Social Workerr. Management plans discussed with the patient, family and they are in agreement.  CODE STATUS: Full.  TOTAL TIME TAKING CARE OF THIS PATIENT: 35 minutes.   Daughter in law in room.  POSSIBLE D/C IN 2-3 DAYS, DEPENDING ON CLINICAL CONDITION.   Vaughan Basta M.D on 01/27/2018   Between 7am to 6pm - Pager - [phone]  After 6pm go to [email or web address] - password [email or web address] [facility]  Office  [phone]  CC: Primary care physician; Kirk Ruths, MD  Note: This dictation was prepared with Dragon dictation along with smaller phrase technology. Any transcriptional errors that result from this process are unintentional.

## 2018-01-27 NOTE — Discharge Summary (Signed)
[place] [facility] [place] NAME: Maria [name]    MR#:  [id number]  DATE OF BIRTH:  [birth year]  DATE OF ADMISSION:  01/24/2018 ADMITTING PHYSICIAN: Henreitta Leber, MD  DATE OF DISCHARGE: 01/27/2018   PRIMARY CARE PHYSICIAN: Kirk Ruths, MD    ADMISSION DIAGNOSIS:  Fall [W19.XXXA] Closed fracture of neck of right femur, initial encounter ([place]) [S72.001A] Fall, initial encounter [W19.XXXA]  DISCHARGE DIAGNOSIS:  Active Problems:   Hip fracture ([place])   SECONDARY DIAGNOSIS:   Past Medical History:  Diagnosis Date  . Arthritis    fingers, knees  . Cancer ([place])    melanoma  . CHF (congestive heart failure) ([place])   . Complication of anesthesia    Pt reports "coded" during spinal injection during back surgery - 8 yrs ago - [place], [place]  . Diverticulitis   . Environmental and seasonal allergies   . Hypercholesteremia   . Hypertension   . Left arm weakness    positional  . Left bundle branch block (LBBB)   . Motion sickness    seasick  . Seizures ([place]) 06/2013   reaction to exposure to allergan (dogs and cats)  . Vasovagal episode     HOSPITAL COURSE:   84 year old female with past medical history of hypertension, hyperlipidemia, osteoarthritis, history of melanoma, who presented to the hospital after mechanical fall and noted to have a right hip fracture.  *  Status post fall and right hip fracture-secondary to mechanical fall.  - s/p right total hip arthroplasty 01/24/18 - participating with PT now.  * Foot pain- Xray negative for pathologies, she had surgical interventions in past- ortho advised tylenol and Boot.  * Glaucoma-continue Combigan, Timoptic, latanoprost eyedrops.  * Essential hypertension-continue carvedilol, ramipril.  * DVT prophylaxis- ASA bid and SCD per ortho.  * Low grade fever    100.5- no cough, chills, urinary symptoms, no SOB. WBCs normal.    May be post surgical, no need to  treat- advised to take tylenol as needed.  DISCHARGE CONDITIONS:   Stable.  CONSULTS OBTAINED:  Treatment Team:  Hessie Knows, MD  DRUG ALLERGIES:   Allergies  Allergen Reactions  . Penicillin G Hives    AGE 33 yo PCN allergy  . Sulfa Antibiotics Other (See Comments) and Anaphylaxis  . Sulfamethoxazole-Trimethoprim Itching, Palpitations, Other (See Comments), Rash and Swelling    Shaking, redness, sneezing  . Articaine-Epinephrine Other (See Comments)  . Darvon [Propoxyphene] Other (See Comments)    Altered mental status  . Erythromycin Other (See Comments)  . Guaifenesin Other (See Comments)    Insomnia and anxiety  . Levofloxacin Other (See Comments)    750mg  dosage after 24 hours caused confusion, off-balanced CNS  . Metronidazole Nausea Only    Abdominal cramping  Gi upset  . Molds & Smuts Cough  . Other Other (See Comments)    septocaine caused numbness  . Penicillins Swelling  . Statins Other (See Comments)  . Tetracycline Other (See Comments)  . Erythromycin Base Rash    GI upset  . Macrobid [Nitrofurantoin] Rash  . Spinach Palpitations    Irregular heartbeat  . Tape Rash  . Tetracyclines & Related Rash    DISCHARGE MEDICATIONS:   Allergies as of 01/27/2018      Reactions   Penicillin G Hives   AGE 56 yo PCN allergy   Sulfa Antibiotics Other (See Comments), Anaphylaxis   Sulfamethoxazole-trimethoprim Itching, Palpitations, Other (See Comments), Rash, Swelling  Shaking, redness, sneezing   Articaine-epinephrine Other (See Comments)   Darvon [propoxyphene] Other (See Comments)   Altered mental status   Erythromycin Other (See Comments)   Guaifenesin Other (See Comments)   Insomnia and anxiety   Levofloxacin Other (See Comments)   750mg  dosage after 24 hours caused confusion, off-balanced CNS   Metronidazole Nausea Only   Abdominal cramping  Gi upset   Molds & Smuts Cough   Other Other (See Comments)   septocaine caused numbness   Penicillins  Swelling   Statins Other (See Comments)   Tetracycline Other (See Comments)   Erythromycin Base Rash   GI upset   Macrobid [nitrofurantoin] Rash   Spinach Palpitations   Irregular heartbeat   Tape Rash   Tetracyclines & Related Rash      Medication List    STOP taking these medications   acetaminophen 500 MG tablet Commonly known as:  TYLENOL   losartan 100 MG tablet Commonly known as:  COZAAR   metoprolol succinate 25 MG 24 hr tablet Commonly known as:  TOPROL-XL   metroNIDAZOLE 500 MG tablet Commonly known as:  FLAGYL   ondansetron 4 MG disintegrating tablet Commonly known as:  ZOFRAN-ODT   polyethylene glycol powder powder Commonly known as:  GLYCOLAX/MIRALAX     TAKE these medications   Acidophilus Lactobacillus Caps Take 1 capsule by mouth daily.   aspirin 81 MG chewable tablet Chew 1 tablet (81 mg total) by mouth 2 (two) times daily.   carvedilol 6.25 MG tablet Commonly known as:  COREG Take 1 tablet by mouth 2 (two) times daily.   COMBIGAN 0.2-0.5 % ophthalmic solution Generic drug:  brimonidine-timolol Place 1 drop into both eyes every 12 (twelve) hours.   conjugated estrogens vaginal cream Commonly known as:  PREMARIN Place 1 Applicatorful vaginally daily. Use pea sized amount M-W-Fr before bedtime   Cranberry 450 MG Caps Take 2 tablets by mouth daily.   docusate sodium 100 MG capsule Commonly known as:  COLACE Take 1 capsule (100 mg total) by mouth 2 (two) times daily.   EPINEPHrine 0.3 mg/0.3 mL Soaj injection Commonly known as:  EPI-PEN Inject into the muscle once.   HYDROcodone-acetaminophen 5-325 MG tablet Commonly known as:  NORCO/VICODIN Take 1-2 tablets by mouth every 4 (four) hours as needed for moderate pain.   latanoprost 0.005 % ophthalmic solution Commonly known as:  XALATAN Place 1 drop into both eyes at bedtime.   magnesium hydroxide 400 MG/5ML suspension Commonly known as:  MILK OF MAGNESIA Take 30 mLs by mouth daily  as needed for mild constipation.   methocarbamol 500 MG tablet Commonly known as:  ROBAXIN Take 1 tablet (500 mg total) by mouth every 6 (six) hours as needed for muscle spasms.   ramipril 10 MG capsule Commonly known as:  ALTACE Take 10 mg by mouth 2 (two) times daily.   timolol 0.5 % ophthalmic solution Commonly known as:  TIMOPTIC Place 1 drop into both eyes every morning.        DISCHARGE INSTRUCTIONS:   Follow with ortho clinic in 1-2 weeks.  If you experience worsening of your admission symptoms, develop shortness of breath, life threatening emergency, suicidal or homicidal thoughts you must seek medical attention immediately by calling 911 or calling your MD immediately  if symptoms less severe.  You Must read complete instructions/literature along with all the possible adverse reactions/side effects for all the Medicines you take and that have been prescribed to you. Take any new Medicines after  you have completely understood and accept all the possible adverse reactions/side effects.   Please note  You were cared for by a hospitalist during your hospital stay. If you have any questions about your discharge medications or the care you received while you were in the hospital after you are discharged, you can call the unit and asked to speak with the hospitalist on call if the hospitalist that took care of you is not available. Once you are discharged, your primary care physician will handle any further medical issues. Please note that NO REFILLS for any discharge medications will be authorized once you are discharged, as it is imperative that you return to your primary care physician (or establish a relationship with a primary care physician if you do not have one) for your aftercare needs so that they can reassess your need for medications and monitor your lab values.    Today   CHIEF COMPLAINT:   Chief Complaint  Patient presents with  . Fall    HISTORY OF PRESENT  ILLNESS:  Maria [name]  is a 84 y.o. female with a known history of essential hypertension, hyperlipidemia, history of melanoma, history of osteoarthritis who presents to the hospital after a mechanical fall and noted to have a right hip fracture.  Patient fell yesterday at a [place] party at her independent living.  She was able to deal with the pain overnight but this morning she could barely put any weight on her right leg and was dragging it and therefore came to the ER for further evaluation.  In the emergency room patient underwent x-rays of her right hip which showed a subcapital femoral neck fracture.  Hospitalist services were contacted for admission.  Patient denied any prodromal symptoms prior to her fall like any chest pain, shortness of breath palpitations dizziness or syncope.   VITAL SIGNS:  Blood pressure (!) 157/71, pulse 77, temperature (!) 100.5 F (38.1 C), temperature source Oral, resp. rate 18, height 5\' 3"  (1.6 m), weight 61.2 kg, SpO2 99 %.  I/O:    Intake/Output Summary (Last 24 hours) at 01/27/2018 1229 Last data filed at 01/27/2018 0902 Gross per 24 hour  Intake 600 ml  Output -  Net 600 ml    PHYSICAL EXAMINATION:   GENERAL:  84 y.o.-year-old patient lying in the bed with no acute distress.  EYES: Pupils equal, round, reactive to light and accommodation. No scleral icterus. Extraocular muscles intact.  HEENT: Head atraumatic, normocephalic. Oropharynx and nasopharynx clear.  NECK:  Supple, no jugular venous distention. No thyroid enlargement, no tenderness.  LUNGS: Normal breath sounds bilaterally, no wheezing, rales,rhonchi or crepitation. No use of accessory muscles of respiration.  CARDIOVASCULAR: S1, S2 normal. No murmurs, rubs, or gallops.  ABDOMEN: Soft, nontender, nondistended. Bowel sounds present. No organomegaly or mass.  EXTREMITIES: No pedal edema, cyanosis, or clubbing.  NEUROLOGIC: Cranial nerves II through XII are intact. Muscle strength 4/5 in  all extremities except RLL due to post surgery pain. Sensation intact. Gait not checked.  PSYCHIATRIC: The patient is alert and oriented x 3.  SKIN: No obvious rash, lesion, or ulcer.   DATA REVIEW:   CBC Recent Labs  Lab 01/27/18 0347  WBC 7.2  HGB 10.3*  HCT 30.7*  PLT 169    Chemistries  Recent Labs  Lab 01/24/18 1021 01/25/18 0433  NA 134* 136  K 4.1 3.9  CL 98 105  CO2 24 25  GLUCOSE 104* 139*  BUN 13 11  CREATININE 0.66 0.59  CALCIUM 9.3 8.4*  AST 27  --   ALT 20  --   ALKPHOS 61  --   BILITOT 1.2  --     Cardiac Enzymes No results for input(s): TROPONINI in the last 168 hours.  Microbiology Results  Results for orders placed or performed during the hospital encounter of 04/02/16  Urine culture     Status: Abnormal   Collection Time: 04/02/16  4:51 PM  Result Value Ref Range Status   Specimen Description URINE, CLEAN CATCH  Final   Special Requests NONE  Final   Culture (A)  Final    <10,000 COLONIES/mL INSIGNIFICANT GROWTH Performed at [facility]    Report Status 04/03/2016 FINAL  Final    RADIOLOGY:  No results found.  EKG:   Orders placed or performed during the hospital encounter of 01/24/18  . ED EKG  . ED EKG  . EKG 12-Lead  . EKG 12-Lead      Management plans discussed with the patient, family and they are in agreement.  CODE STATUS:     Code Status Orders  (From admission, onward)         Start     Ordered   01/24/18 1256  Full code  Continuous     01/24/18 1255        Code Status History    Date Active Date Inactive Code Status Order ID Comments User Context   04/02/2016 1350 04/05/2016 1939 DNR [id number]  Loletha Grayer, MD Inpatient   12/10/2015 2319 12/13/2015 1549 Full Code [id number]  Hugelmeyer, Ubaldo Glassing, DO Inpatient    Advance Directive Documentation     Most Recent Value  Type of Advance Directive  Out of facility DNR (pink MOST or yellow form)  Pre-existing out of facility DNR order (yellow form or  pink MOST form)  -  "MOST" Form in Place?  -      TOTAL TIME TAKING CARE OF THIS PATIENT: 35 minutes.    Vaughan Basta M.D on 01/27/2018 at 12:29 PM  Between 7am to 6pm - Pager - [phone]  After 6pm go to [email or web address] - password [email or web address] [facility]  Office  [phone]  CC: Primary care physician; Kirk Ruths, MD   Note: This dictation was prepared with Dragon dictation along with smaller phrase technology. Any transcriptional errors that result from this process are unintentional.

## 2018-01-27 NOTE — Progress Notes (Signed)
Physical Therapy Treatment Patient Details Name: Maria [name] MRN: [id number] DOB: May 24, 1930 Today's Date: 01/27/2018    History of Present Illness Pt. is an 84 y.o. female who was admitted to [facility] for an Anterior Approach THA of a Right Femoral Neck Fracture. PMH includes vasovagal episode, seizures, LBBB, L arm weakness, Htn, Hcl, CA and arthritis.    PT Comments    Maria [name] was eager to work with therapy and made good progress toward her mobility goals.  She does, however, fatigue quickly with OOB activity, requiring several standing rest breaks when ambulating in hallway.  She requires cues for proper sequencing when ambulating and for safety with transfers.  SNF remains most appropriate d/c plan at this time.     Follow Up Recommendations  SNF     Equipment Recommendations  Rolling walker with 5" wheels(TBD at next venue of care.)    Recommendations for Other Services       Precautions / Restrictions Precautions Precautions: None Precaution Booklet Issued: No Precaution Comments: direct anterior approach, no precautions per order.  Pt has CAM boot to be used for comfort, x-ray of L foot neg, did not use this session per pt's preference.  Restrictions Weight Bearing Restrictions: Yes RLE Weight Bearing: Weight bearing as tolerated    Mobility  Bed Mobility Overal bed mobility: Needs Assistance Bed Mobility: Supine to Sit     Supine to sit: Min assist;HOB elevated     General bed mobility comments: Cues for sequencing.  Assist to bring RLE to EOB.   Transfers Overall transfer level: Needs assistance Equipment used: Rolling walker (2 wheeled) Transfers: Sit to/from Stand Sit to Stand: Min guard         General transfer comment: Pt slow to rise with mild instability but no LOB.  Cues for hand placement when sitting.   Ambulation/Gait Ambulation/Gait assistance: Min guard Gait Distance (Feet): 70 Feet Assistive device: Rolling walker (2 wheeled) Gait  Pattern/deviations: Step-to pattern;Step-through pattern;Decreased step length - right;Decreased step length - left;Decreased weight shift to right   Gait velocity interpretation: <1.31 ft/sec, indicative of household ambulator General Gait Details: Cues for proper sequencing using RW.  Pt takes 2 standing rest breaks due to fatigue.  Denies dizziness.  Transitions from step to pattern to step throuh pattern without cues.    Stairs             Wheelchair Mobility    Modified Rankin (Stroke Patients Only)       Balance Overall balance assessment: Needs assistance Sitting-balance support: No upper extremity supported;Feet supported Sitting balance-Maria [name] Scale: Good     Standing balance support: Bilateral upper extremity supported;During functional activity Standing balance-Maria [name] Scale: Poor Standing balance comment: Pt relies on BUE support for static and dynamic activities                            Cognition Arousal/Alertness: Awake/alert Behavior During Therapy: WFL for tasks assessed/performed Overall Cognitive Status: Within Functional Limits for tasks assessed                                        Exercises Total Joint Exercises Heel Slides: AROM;Right;10 reps;Supine Hip ABduction/ADduction: AAROM;Right;10 reps;Supine    General Comments General comments (skin integrity, edema, etc.): Daughter present during session.      Pertinent Vitals/Pain Pain Assessment: 0-10 Pain Score: 7  Pain Location: R hip(2/10 L foot) Pain Descriptors / Indicators: Aching;Grimacing;Guarding Pain Intervention(s): Limited activity within patient's tolerance;Monitored during session;Repositioned;Utilized relaxation techniques;Premedicated before session    Home Living                      Prior Function            PT Goals (current goals can now be found in the care plan section) Acute Rehab PT Goals Patient Stated Goal: To go to rehab  today PT Goal Formulation: With patient Time For Goal Achievement: 02/08/18 Potential to Achieve Goals: Good Progress towards PT goals: Progressing toward goals    Frequency    BID      PT Plan Current plan remains appropriate    Co-evaluation              AM-PAC PT "6 Clicks" Daily Activity  Outcome Measure  Difficulty turning over in bed (including adjusting bedclothes, sheets and blankets)?: A Lot Difficulty moving from lying on back to sitting on the side of the bed? : A Lot Difficulty sitting down on and standing up from a chair with arms (e.g., wheelchair, bedside commode, etc,.)?: A Lot Help needed moving to and from a bed to chair (including a wheelchair)?: A Little Help needed walking in hospital room?: A Little Help needed climbing 3-5 steps with a railing? : A Lot 6 Click Score: 14    End of Session Equipment Utilized During Treatment: Gait belt Activity Tolerance: Patient limited by fatigue Patient left: with call bell/phone within reach;in chair;with chair alarm set;with family/visitor present Nurse Communication: Mobility status PT Visit Diagnosis: Unsteadiness on feet (R26.81);Muscle weakness (generalized) (M62.81);Pain Pain - Right/Left: Right Pain - part of body: Hip     Time: 3716-9678 PT Time Calculation (min) (ACUTE ONLY): 22 min  Charges:  $Gait Training: 8-22 mins                    Collie Siad PT, DPT 01/27/2018, 12:43 PM

## 2018-01-27 NOTE — Progress Notes (Signed)
[facility] [place] NAME: Maria [name]    MR#:  [id number]  DATE OF BIRTH:  July 11, 1930  SUBJECTIVE:  CHIEF COMPLAINT:   Chief Complaint  Patient presents with  . Fall   Came after a fall and have hip fracture- s/p surgery, no complains. REVIEW OF SYSTEMS:  CONSTITUTIONAL: No fever, fatigue or weakness.  EYES: No blurred or double vision.  EARS, NOSE, AND THROAT: No tinnitus or ear pain.  RESPIRATORY: No cough, shortness of breath, wheezing or hemoptysis.  CARDIOVASCULAR: No chest pain, orthopnea, edema.  GASTROINTESTINAL: No nausea, vomiting, diarrhea or abdominal pain.  GENITOURINARY: No dysuria, hematuria.  ENDOCRINE: No polyuria, nocturia,  HEMATOLOGY: No anemia, easy bruising or bleeding SKIN: No rash or lesion. MUSCULOSKELETAL: right hip pain.   NEUROLOGIC: No tingling, numbness, weakness.  PSYCHIATRY: No anxiety or depression.   ROS  DRUG ALLERGIES:   Allergies  Allergen Reactions  . Penicillin G Hives    AGE 84 yo PCN allergy  . Sulfa Antibiotics Other (See Comments) and Anaphylaxis  . Sulfamethoxazole-Trimethoprim Itching, Palpitations, Other (See Comments), Rash and Swelling    Shaking, redness, sneezing  . Articaine-Epinephrine Other (See Comments)  . Darvon [Propoxyphene] Other (See Comments)    Altered mental status  . Erythromycin Other (See Comments)  . Guaifenesin Other (See Comments)    Insomnia and anxiety  . Levofloxacin Other (See Comments)    750mg  dosage after 24 hours caused confusion, off-balanced CNS  . Metronidazole Nausea Only    Abdominal cramping  Gi upset  . Molds & Smuts Cough  . Other Other (See Comments)    septocaine caused numbness  . Penicillins Swelling  . Statins Other (See Comments)  . Tetracycline Other (See Comments)  . Erythromycin Base Rash    GI upset  . Macrobid [Nitrofurantoin] Rash  . Spinach Palpitations    Irregular heartbeat  . Tape Rash  . Tetracyclines & Related  Rash    VITALS:  Blood pressure (!) 157/71, pulse 77, temperature (!) 100.5 F (38.1 C), temperature source Oral, resp. rate 18, height 5\' 3"  (1.6 m), weight 61.2 kg, SpO2 99 %.  PHYSICAL EXAMINATION:  GENERAL:  84 y.o.-year-old patient lying in the bed with no acute distress.  EYES: Pupils equal, round, reactive to light and accommodation. No scleral icterus. Extraocular muscles intact.  HEENT: Head atraumatic, normocephalic. Oropharynx and nasopharynx clear.  NECK:  Supple, no jugular venous distention. No thyroid enlargement, no tenderness.  LUNGS: Normal breath sounds bilaterally, no wheezing, rales,rhonchi or crepitation. No use of accessory muscles of respiration.  CARDIOVASCULAR: S1, S2 normal. No murmurs, rubs, or gallops.  ABDOMEN: Soft, nontender, nondistended. Bowel sounds present. No organomegaly or mass.  EXTREMITIES: No pedal edema, cyanosis, or clubbing.  NEUROLOGIC: Cranial nerves II through XII are intact. Muscle strength 4/5 in all extremities except RLL due to post surgery pain. Sensation intact. Gait not checked.  PSYCHIATRIC: The patient is alert and oriented x 3.  SKIN: No obvious rash, lesion, or ulcer.   Physical Exam LABORATORY PANEL:   CBC Recent Labs  Lab 01/27/18 0347  WBC 7.2  HGB 10.3*  HCT 30.7*  PLT 169   ------------------------------------------------------------------------------------------------------------------  Chemistries  Recent Labs  Lab 01/24/18 1021 01/25/18 0433  NA 134* 136  K 4.1 3.9  CL 98 105  CO2 24 25  GLUCOSE 104* 139*  BUN 13 11  CREATININE 0.66 0.59  CALCIUM 9.3 8.4*  AST 27  --   ALT  20  --   ALKPHOS 61  --   BILITOT 1.2  --    ------------------------------------------------------------------------------------------------------------------  Cardiac Enzymes No results for input(s): TROPONINI in the last 168  hours. ------------------------------------------------------------------------------------------------------------------  RADIOLOGY:  No results found.  ASSESSMENT AND PLAN:   Active Problems:   Hip fracture [facility])  84 year old female with past medical history of hypertension, hyperlipidemia, osteoarthritis, history of melanoma, who presented to the hospital after mechanical fall and noted to have a right hip fracture.  *   Status post fall and right hip fracture-secondary to mechanical fall.   - s/p right total hip arthroplasty 01/24/18 - participating with PT now.  * Foot pain- Xray negative for pathologies, she had surgical interventions in past- ortho advised tylenol and Boot.  *  Glaucoma-continue Combigan, Timoptic, latanoprost eyedrops.  *  Essential hypertension-continue carvedilol, losartan, ramipril.  * DVT prophylaxis- ASA bid and SCD per ortho.   All the records are reviewed and case discussed with Care Management/Social Workerr. Management plans discussed with the patient, family and they are in agreement.  CODE STATUS: Full.  TOTAL TIME TAKING CARE OF THIS PATIENT: 35 minutes.    POSSIBLE D/C IN 1-2 DAYS, DEPENDING ON CLINICAL CONDITION.   Vaughan Basta M.D on 01/27/2018   Between 7am to 6pm - Pager - [phone]  After 6pm go to [email or web address] - password [email or web address] [email or web address]  Office  [phone]  CC: Primary care physician; Kirk Ruths, MD  Note: This dictation was prepared with Dragon dictation along with smaller phrase technology. Any transcriptional errors that result from this process are unintentional.

## 2018-01-27 NOTE — Progress Notes (Signed)
Pt ready for discharge to SNF per MD. Pt assessment unchanged from this morning. Report called to Bet at [facility]; all questions answered and discharge instructions reviewed. EMS transportation set up for pt by RN. Pt and daughter updated. Pt belongings packed.   Ethelda Chick

## 2018-01-27 NOTE — Clinical Social Work Note (Signed)
Patient is medically ready for discharge today. CSW notified patient that she will discharge to [facility] today. Patient is in agreement. CSW notified Lovena Le at [facility] that patient will discharge today. Patient will be transported by EMS. RN will call report and call for transport.   [place], [place]

## 2018-01-27 NOTE — Progress Notes (Signed)
  Subjective: 3 Days Post-Op Procedure(s) (LRB): TOTAL HIP ARTHROPLASTY ANTERIOR APPROACH (Right)  Patient reports pain as mild to the right hip.  Continuing to have left lateral foot pain but she states this is improved with Tylenol. Patient denies any other complaints. Negative for chest pain and shortness of breath Fever: no Gastrointestinal:Negative for nausea and vomiting  Objective: Vital signs in last 24 hours: Temp:  [98 F (36.7 C)-99.7 F (37.6 C)] 98.2 F (36.8 C) (11/03 2315) Pulse Rate:  [69-76] 69 (11/03 2315) Resp:  [17] 17 (11/03 2315) BP: (124-144)/(58-72) 124/58 (11/03 2315) SpO2:  [92 %-97 %] 93 % (11/03 2315)  Intake/Output from previous day:  Intake/Output Summary (Last 24 hours) at 01/27/2018 0729 Last data filed at 01/26/2018 1423 Gross per 24 hour  Intake 840 ml  Output -  Net 840 ml    Intake/Output this shift: No intake/output data recorded.  Labs: Recent Labs    01/24/18 1021 01/25/18 0433 01/27/18 0347  HGB 13.3 11.3* 10.3*   Recent Labs    01/25/18 0433 01/27/18 0347  WBC 8.6 7.2  RBC 3.73* 3.30*  HCT 35.1* 30.7*  PLT 193 169   Recent Labs    01/24/18 1021 01/25/18 0433  NA 134* 136  K 4.1 3.9  CL 98 105  CO2 24 25  BUN 13 11  CREATININE 0.66 0.59  GLUCOSE 104* 139*  CALCIUM 9.3 8.4*   Recent Labs    01/24/18 1021  INR 0.95     EXAM General - Patient is Alert, Appropriate and Oriented Extremity - ABD soft Neurovascular intact Sensation intact distally Intact pulses distally Dorsiflexion/Plantar flexion intact Incision: dressing C/D/I No cellulitis present  Bilateral lower extremity soft with no swelling or edema. Dressing/Incision - clean, dry, no drainage  Motor Function - intact, moving foot and toes well on exam.    Past Medical History:  Diagnosis Date  . Arthritis    fingers, knees  . Cancer ([place])    melanoma  . CHF (congestive heart failure) ([place])   . Complication of anesthesia    Pt reports  "coded" during spinal injection during back surgery - 8 yrs ago - [place], [place]  . Diverticulitis   . Environmental and seasonal allergies   . Hypercholesteremia   . Hypertension   . Left arm weakness    positional  . Left bundle branch block (LBBB)   . Motion sickness    seasick  . Seizures ([place]) 06/2013   reaction to exposure to allergan (dogs and cats)  . Vasovagal episode     Assessment/Plan: 3 Days Post-Op Procedure(s) (LRB): TOTAL HIP ARTHROPLASTY ANTERIOR APPROACH (Right) Active Problems:   Hip fracture (HCC)  Estimated body mass index is 23.91 kg/m as calculated from the following:   Height as of this encounter: 5\' 3"  (1.6 m).   Weight as of this encounter: 61.2 kg. Advance diet Up with therapy   Continue to work on bowel movement. Follow-up with [facility] orthopedics in 2 weeks for staple removal Care management to assist with discharge to skilled nursing facility.   TED hose bilateral lower extremities x6 weeks Aspirin 81 mg twice daily x30 days for DVT prophylaxis   DVT Prophylaxis - Aspirin, Foot Pumps and TED hose Weight-Bearing as tolerated to right leg  Marlowe Sax, PA-C [facility] Orthopaedic Surgery 01/27/2018, 7:29 AM

## 2018-01-28 ENCOUNTER — Other Ambulatory Visit: Payer: Self-pay | Admitting: Adult Health

## 2018-01-28 LAB — SURGICAL PATHOLOGY

## 2018-01-28 MED ORDER — HYDROCODONE-ACETAMINOPHEN 5-325 MG PO TABS: 1.0000 | ORAL_TABLET | ORAL | 0 refills | Status: DC | PRN

## 2018-01-28 NOTE — Anesthesia Postprocedure Evaluation (Signed)
Anesthesia Post Note  Patient: Maria [name]  Procedure(s) Performed: TOTAL HIP ARTHROPLASTY ANTERIOR APPROACH (Right Hip)  Anesthesia Type: Spinal     Last Vitals:  Vitals:   01/27/18 1200 01/27/18 1521  BP:  139/60  Pulse:  74  Resp:  18  Temp: 37.2 C 36.9 C  SpO2:  98%    Last Pain:  Vitals:   01/27/18 1521  TempSrc: Oral  PainSc:                  Jermani Eberlein S

## 2018-01-29 ENCOUNTER — Non-Acute Institutional Stay (SKILLED_NURSING_FACILITY): Payer: Medicare Other | Admitting: Adult Health

## 2018-01-29 ENCOUNTER — Encounter: Payer: Self-pay | Admitting: Adult Health

## 2018-01-29 DIAGNOSIS — I5022 Chronic systolic (congestive) heart failure: Secondary | ICD-10-CM

## 2018-01-29 DIAGNOSIS — S72011D Unspecified intracapsular fracture of right femur, subsequent encounter for closed fracture with routine healing: Secondary | ICD-10-CM

## 2018-01-29 DIAGNOSIS — I10 Essential (primary) hypertension: Secondary | ICD-10-CM

## 2018-01-29 DIAGNOSIS — K5909 Other constipation: Secondary | ICD-10-CM

## 2018-01-29 DIAGNOSIS — M1A09X0 Idiopathic chronic gout, multiple sites, without tophus (tophi): Secondary | ICD-10-CM | POA: Diagnosis not present

## 2018-01-29 DIAGNOSIS — H409 Unspecified glaucoma: Secondary | ICD-10-CM

## 2018-01-29 NOTE — Progress Notes (Signed)
Location:   [place] [facility] Room Number: [place] of Service:  SNF (31)   CODE STATUS: DNR  Allergies  Allergen Reactions  . Dog Epithelium Allergy Skin Test Shortness Of Breath    Seizure  . Penicillin G Hives    AGE 84 yo PCN allergy  . Sulfa Antibiotics Other (See Comments) and Anaphylaxis  . Sulfamethoxazole-Trimethoprim Itching, Palpitations, Other (See Comments), Rash and Swelling    Shaking, redness, sneezing  . Uncaria Tomentosa (Cats Claw) Anaphylaxis    Seizure  . Guaifenesin Other (See Comments)    Insomnia and anxiety  . Levofloxacin Other (See Comments)    750mg  dosage after 24 hours caused confusion, off-balanced CNS  . Metronidazole Nausea Only    Abdominal cramping  Gi upset  . Molds & Smuts Cough  . Penicillins Swelling  . Articaine-Epinephrine Other (See Comments) and Swelling    At site, nerve loss for few days, used by dentist  . Erythromycin Other (See Comments), Nausea Only and Rash  . Erythromycin Base Rash    GI upset  . Hydralazine Hcl Other (See Comments)    hyponatremia  . Nitrofurantoin Rash and Nausea Only  . Other Other (See Comments)    septocaine caused numbness septocaine caused numbness  . Propoxyphene Other (See Comments)    Altered mental status  . Spinach Palpitations    Irregular heartbeat  . Statins Other (See Comments)  . Tape Rash  . Tetracycline Other (See Comments), Nausea Only and Rash  . Tetracyclines & Related Rash    Chief Complaint  Patient presents with  . Acute Visit    Constipation    HPI:  She is a 84 year old woman who suffered a right subcapital femoral neck fracture. She did have a total right hip arthroplasty. She denies any uncontrolled pain; no anxiety or depressive thoughts; no changes in appetite; does have constipation. She will continue to be followed for her chronic illnesses including: hypertension; systolic chf; gout. The goal of her care is for short term rehab.    Past Medical History:  Diagnosis Date  . Arthritis    fingers, knees  . Cancer ([place])    melanoma  . CHF (congestive heart failure) ([place])   . Complication of anesthesia    Pt reports "coded" during spinal injection during back surgery - 8 yrs ago - [place], [place]  . Diverticulitis   . Environmental and seasonal allergies   . Hypercholesteremia   . Hypertension   . Left arm weakness    positional  . Left bundle branch block (LBBB)   . Motion sickness    seasick  . Seizures ([place]) 06/2013   reaction to exposure to allergan (dogs and cats)  . Vasovagal episode     Past Surgical History:  Procedure Laterality Date  . BACK SURGERY     multiple  . CATARACT EXTRACTION W/ INTRAOCULAR LENS  IMPLANT, BILATERAL    . COLONOSCOPY WITH PROPOFOL N/A 06/27/2016   Procedure: COLONOSCOPY WITH PROPOFOL;  Surgeon: Manya Silvas, MD;  Location: [facility] ENDOSCOPY;  Service: Endoscopy;  Laterality: N/A;  . HAMMERTOE RECONSTRUCTION WITH WEIL OSTEOTOMY Left 08/29/2006  . JOINT REPLACEMENT  08/27/2014   knee  . KNEE ARTHROCENTESIS Left 04/26/2008  . LUMBAR LAMINECTOMY N/A 06/25/2015  . MASS EXCISION Left 12/15/2014   Procedure: MINOR EXCISION OF MASS LEFT INCLUSION CYST;  Surgeon: Beverly Gust, MD;  Location: [place];  Service: ENT;  Laterality: Left;  NEEDS 2  NURSE  . MELANOMA EXCISION Right 03/14/2016  . ROTATOR CUFF REPAIR Bilateral   . toric lens Left 07/15/12 and 08/05/12  . TOTAL HIP ARTHROPLASTY Right 01/24/2018   Procedure: TOTAL HIP ARTHROPLASTY ANTERIOR APPROACH;  Surgeon: Hessie Knows, MD;  Location: [facility] ORS;  Service: Orthopedics;  Laterality: Right;    Social History   Socioeconomic History  . Marital status: Widowed    Spouse name: Not on file  . Number of children: Not on file  . Years of education: Not on file  . Highest education level: Not on file  Occupational History  . Not on file  Social Needs  . Financial resource strain: Not on file  . Food insecurity:     Worry: Not on file    Inability: Not on file  . Transportation needs:    Medical: Not on file    Non-medical: Not on file  Tobacco Use  . Smoking status: Never Smoker  . Smokeless tobacco: Never Used  Substance and Sexual Activity  . Alcohol use: No  . Drug use: No  . Sexual activity: Not on file  Lifestyle  . Physical activity:    Days per week: Not on file    Minutes per session: Not on file  . Stress: Not on file  Relationships  . Social connections:    Talks on phone: Not on file    Gets together: Not on file    Attends religious service: Not on file    Active member of club or organization: Not on file    Attends meetings of clubs or organizations: Not on file    Relationship status: Not on file  . Intimate partner violence:    Fear of current or ex partner: Not on file    Emotionally abused: Not on file    Physically abused: Not on file    Forced sexual activity: Not on file  Other Topics Concern  . Not on file  Social History Narrative  . Not on file   Family History  Problem Relation Age of Onset  . Kidney Stones Mother   . Ovarian cancer Mother   . Heart disease Mother   . Heart disease Father   . Alzheimer's disease Sister   . Heart disease Maternal Grandmother   . Heart disease Maternal Grandfather   . Heart disease Paternal Grandmother   . Kidney disease Neg Hx       VITAL SIGNS BP (!) 146/74   Pulse 72   Temp 98.1 F (36.7 C)   Resp 18   Ht 5\' 3"  (1.6 m) Comment: taken from review flow sheet  Wt 135 lb (61.2 kg) Comment: taken from review flow sheet  SpO2 92%   BMI 23.91 kg/m   Outpatient Encounter Medications as of 01/29/2018  Medication Sig Note  . Acidophilus Lactobacillus CAPS Take 1 capsule by mouth daily.    Marland Kitchen aspirin 325 MG tablet Take 325 mg by mouth 2 (two) times daily.   . brimonidine-timolol (COMBIGAN) 0.2-0.5 % ophthalmic solution Place 1 drop into both eyes every 12 (twelve) hours.   . carvedilol (COREG) 6.25 MG tablet  Take 1 tablet by mouth 2 (two) times daily.   . Cholecalciferol 100 MCG (4000 UT) CAPS Take 1 capsule by mouth daily.   . colchicine 0.6 MG tablet Take 0.6 mg by mouth 2 (two) times daily.   . Cranberry 450 MG CAPS Take 2 tablets by mouth daily.   Marland Kitchen docusate sodium (COLACE) 100 MG  capsule Take 1 capsule (100 mg total) by mouth 2 (two) times daily.   Marland Kitchen HYDROcodone-acetaminophen (NORCO/VICODIN) 5-325 MG tablet Take 1-2 tablets by mouth every 4 (four) hours as needed for moderate pain (1 tab for moderate 2 tabs for severe).   Marland Kitchen latanoprost (XALATAN) 0.005 % ophthalmic solution Place 1 drop into both eyes at bedtime.    . magnesium hydroxide (MILK OF MAGNESIA) 400 MG/5ML suspension Take 30 mLs by mouth daily as needed for mild constipation.   . methocarbamol (ROBAXIN) 500 MG tablet Take 1 tablet (500 mg total) by mouth every 6 (six) hours as needed for muscle spasms.   . predniSONE (DELTASONE) 20 MG tablet Take 20 mg by mouth daily with breakfast. Starting on 01/28/18 ending on 02/02/18   . senna (SENOKOT) 8.6 MG tablet Take 2 tablets by mouth 2 (two) times daily.   Marland Kitchen telmisartan (MICARDIS) 80 MG tablet Take 80 mg by mouth daily.    No facility-administered encounter medications on file as of 01/29/2018.      SIGNIFICANT DIAGNOSTIC EXAMS  TODAY:   01-24-18: left hip pelvic x-ray: 1. Nondisplaced right subcapital femoral neck fracture.  01-24-18: chest x-ray: No evidence of active cardiopulmonary disease.  LABS REVIEWED TODAY:   01-24-18: wbc 8.9; hgb 13.3; hct 41.1; mcv 93.4; plt 169; glucose 104; bun 13; creat 0.66; k+ 4.1; na++ 134; ca 9.3; liver normal albumin 4.1  01-25-18: wbc 8.6; hgb 11.3; hct 35.1; mcv 94.1; plt 193; glucose 139; bun 11; creat 0.59; k+ 3.9; na+ +136; ca 8.4    Review of Systems  Constitutional: Negative for malaise/fatigue.  Respiratory: Negative for cough and shortness of breath.   Cardiovascular: Negative for chest pain, palpitations and leg swelling.   Gastrointestinal: Positive for constipation. Negative for abdominal pain and heartburn.  Musculoskeletal: Positive for joint pain. Negative for back pain and myalgias.       Right hip pain is managed   Skin: Negative.   Neurological: Negative for dizziness.  Psychiatric/Behavioral: The patient is not nervous/anxious.     Physical Exam  Constitutional: She is oriented to person, place, and time. She appears well-developed and well-nourished. No distress.  Neck: No thyromegaly present.  Cardiovascular: Normal rate, regular rhythm, normal heart sounds and intact distal pulses.  Pulmonary/Chest: Effort normal and breath sounds normal. No respiratory distress.  Abdominal: Soft. Bowel sounds are normal. She exhibits no distension. There is no tenderness.  Musculoskeletal: She exhibits no edema.  Is able to move all extremities Is status post right hip fracture   Lymphadenopathy:    She has no cervical adenopathy.  Neurological: She is alert and oriented to person, place, and time.  Skin: Skin is warm and dry. She is not diaphoretic.  Psychiatric: She has a normal mood and affect.     ASSESSMENT/ PLAN:  TODAY:   1. Benign essential hypertension: is stable b/p 146/74: will continue micardis 80 mg daily coreg 6.25 mg twice daily   2. Chronic systolic CHF (congestive heart failure) NYHA class 2: is stable will continue coreg 6.25 mg twice daily   3. Chronic constipation: is worse; will continue colace twice daily senna 2 tabs twice daily  and will begin miralax daily   4. Chronic gout of multiple sites: is stable will continue colchicine 0.6 mg twice daly   5. Glaucoma: is stable will continue xalatan to both eyes  6. Subcapital fracture of femur, right closed, with routine healing subsequent encounter: is stable will continue therapy as directed and will  follow up with orthopedics; will continue asa 325 mg twice daily ; vicodin 5.325 mg 1 or 2 tabs every 4 hours as needed; robaxin 500  mg every 6 hours as needed.    MD is aware of resident's narcotic use and is in agreement with current plan of care. We will attempt to wean resident as apropriate   Ok Edwards NP [facility]  Contact [phone] Monday through Friday 8am- 5pm  After hours call [phone]

## 2018-02-01 DIAGNOSIS — H409 Unspecified glaucoma: Secondary | ICD-10-CM | POA: Insufficient documentation

## 2018-02-01 DIAGNOSIS — K5909 Other constipation: Secondary | ICD-10-CM | POA: Insufficient documentation

## 2018-02-01 DIAGNOSIS — S72011D Unspecified intracapsular fracture of right femur, subsequent encounter for closed fracture with routine healing: Secondary | ICD-10-CM | POA: Insufficient documentation

## 2018-02-01 DIAGNOSIS — M1A09X0 Idiopathic chronic gout, multiple sites, without tophus (tophi): Secondary | ICD-10-CM | POA: Insufficient documentation

## 2018-02-04 ENCOUNTER — Non-Acute Institutional Stay (SKILLED_NURSING_FACILITY): Payer: Medicare Other | Admitting: Adult Health

## 2018-02-04 ENCOUNTER — Encounter: Payer: Self-pay | Admitting: Adult Health

## 2018-02-04 DIAGNOSIS — H409 Unspecified glaucoma: Secondary | ICD-10-CM

## 2018-02-04 DIAGNOSIS — M1A09X0 Idiopathic chronic gout, multiple sites, without tophus (tophi): Secondary | ICD-10-CM

## 2018-02-04 DIAGNOSIS — S72011D Unspecified intracapsular fracture of right femur, subsequent encounter for closed fracture with routine healing: Secondary | ICD-10-CM

## 2018-02-04 NOTE — Progress Notes (Signed)
[facility]   [facility] Room Number: [place] of Service:  SNF (31)   CODE STATUS: DNR  Allergies  Allergen Reactions  . Dog Epithelium Allergy Skin Test Shortness Of Breath    Seizure  . Penicillin G Hives    AGE 84 yo PCN allergy  . Sulfa Antibiotics Other (See Comments) and Anaphylaxis  . Sulfamethoxazole-Trimethoprim Itching, Palpitations, Other (See Comments), Rash and Swelling    Shaking, redness, sneezing  . Uncaria Tomentosa (Cats Claw) Anaphylaxis    Seizure  . Guaifenesin Other (See Comments)    Insomnia and anxiety  . Levofloxacin Other (See Comments)    750mg  dosage after 24 hours caused confusion, off-balanced CNS  . Metronidazole Nausea Only    Abdominal cramping  Gi upset  . Molds & Smuts Cough  . Penicillins Swelling  . Articaine-Epinephrine Other (See Comments) and Swelling    At site, nerve loss for few days, used by dentist  . Erythromycin Other (See Comments), Nausea Only and Rash  . Erythromycin Base Rash    GI upset  . Hydralazine Hcl Other (See Comments)    hyponatremia  . Nitrofurantoin Rash and Nausea Only  . Other Other (See Comments)    septocaine caused numbness septocaine caused numbness  . Propoxyphene Other (See Comments)    Altered mental status  . Spinach Palpitations    Irregular heartbeat  . Statins Other (See Comments)  . Tape Rash  . Tetracycline Other (See Comments), Nausea Only and Rash  . Tetracyclines & Related Rash    Chief Complaint  Patient presents with  . Medical Management of Chronic Issues    Subcapital fracture of femur right closed with routine healing subsequent encounter; glaucoma of both eyes unspecified glaucoma type; chronic gout of multiple sites; unspecified cause. Weekly follow up for the first 30 days post hospitalization and a routine care plan.     HPI:  She is a 84 year old short term resident of this facility is being seen for the management of her chronic illnesses:  right femur fracture; glaucoma gout. Her diarrhea is improving since changing her colchicine to twice daily as needed. Her right hip pain is being managed; her vicodin was stopped due to hypotension. She has a poor tolerance to medications. She continues to participate in therapy is ambulating 100 feet with therapy. Upon discharge she will need a front wheel walker and a raised commode. She will need home health as well.   Past Medical History:  Diagnosis Date  . Arthritis    fingers, knees  . Cancer ([place])    melanoma  . CHF (congestive heart failure) ([place])   . Complication of anesthesia    Pt reports "coded" during spinal injection during back surgery - 8 yrs ago - [place], [place]  . Diverticulitis   . Environmental and seasonal allergies   . Hypercholesteremia   . Hypertension   . Left arm weakness    positional  . Left bundle branch block (LBBB)   . Motion sickness    seasick  . Seizures ([place]) 06/2013   reaction to exposure to allergan (dogs and cats)  . Vasovagal episode     Past Surgical History:  Procedure Laterality Date  . BACK SURGERY     multiple  . CATARACT EXTRACTION W/ INTRAOCULAR LENS  IMPLANT, BILATERAL    . COLONOSCOPY WITH PROPOFOL N/A 06/27/2016   Procedure: COLONOSCOPY WITH PROPOFOL;  Surgeon: Manya Silvas, MD;  Location: [facility] ENDOSCOPY;  Service: Endoscopy;  Laterality: N/A;  . HAMMERTOE RECONSTRUCTION WITH WEIL OSTEOTOMY Left 08/29/2006  . JOINT REPLACEMENT  08/27/2014   knee  . KNEE ARTHROCENTESIS Left 04/26/2008  . LUMBAR LAMINECTOMY N/A 06/25/2015  . MASS EXCISION Left 12/15/2014   Procedure: MINOR EXCISION OF MASS LEFT INCLUSION CYST;  Surgeon: Beverly Gust, MD;  Location: [place];  Service: ENT;  Laterality: Left;  NEEDS 2 NURSE  . MELANOMA EXCISION Right 03/14/2016  . ROTATOR CUFF REPAIR Bilateral   . toric lens Left 07/15/12 and 08/05/12  . TOTAL HIP ARTHROPLASTY Right 01/24/2018   Procedure: TOTAL HIP ARTHROPLASTY ANTERIOR APPROACH;   Surgeon: Hessie Knows, MD;  Location: [facility] ORS;  Service: Orthopedics;  Laterality: Right;    Social History   Socioeconomic History  . Marital status: Widowed    Spouse name: Not on file  . Number of children: Not on file  . Years of education: Not on file  . Highest education level: Not on file  Occupational History  . Not on file  Social Needs  . Financial resource strain: Not on file  . Food insecurity:    Worry: Not on file    Inability: Not on file  . Transportation needs:    Medical: Not on file    Non-medical: Not on file  Tobacco Use  . Smoking status: Never Smoker  . Smokeless tobacco: Never Used  Substance and Sexual Activity  . Alcohol use: No  . Drug use: No  . Sexual activity: Not on file  Lifestyle  . Physical activity:    Days per week: Not on file    Minutes per session: Not on file  . Stress: Not on file  Relationships  . Social connections:    Talks on phone: Not on file    Gets together: Not on file    Attends religious service: Not on file    Active member of club or organization: Not on file    Attends meetings of clubs or organizations: Not on file    Relationship status: Not on file  . Intimate partner violence:    Fear of current or ex partner: Not on file    Emotionally abused: Not on file    Physically abused: Not on file    Forced sexual activity: Not on file  Other Topics Concern  . Not on file  Social History Narrative  . Not on file   Family History  Problem Relation Age of Onset  . Kidney Stones Mother   . Ovarian cancer Mother   . Heart disease Mother   . Heart disease Father   . Alzheimer's disease Sister   . Heart disease Maternal Grandmother   . Heart disease Maternal Grandfather   . Heart disease Paternal Grandmother   . Kidney disease Neg Hx       VITAL SIGNS BP (!) 149/78   Pulse 63   Temp 97.8 F (36.6 C)   Resp 20   Ht 5\' 3"  (1.6 m)   Wt 135 lb (61.2 kg)   SpO2 100%   BMI 23.91 kg/m   Outpatient  Encounter Medications as of 02/04/2018  Medication Sig  . acetaminophen (TYLENOL) 325 MG tablet Take 650 mg by mouth every 4 (four) hours as needed for mild pain or fever.  Marland Kitchen aspirin 325 MG tablet Take 325 mg by mouth 2 (two) times daily.  . brimonidine-timolol (COMBIGAN) 0.2-0.5 % ophthalmic solution Place 1 drop into both eyes every 12 (twelve) hours.  Marland Kitchen  carvedilol (COREG) 6.25 MG tablet Take 1 tablet by mouth 2 (two) times daily.  . Cholecalciferol 100 MCG (4000 UT) CAPS Take 1 capsule by mouth daily.  . colchicine 0.6 MG tablet Take 0.6 mg by mouth 2 (two) times daily as needed.   . Cranberry 450 MG CAPS Take 2 tablets by mouth daily.  Marland Kitchen docusate sodium (COLACE) 100 MG capsule Take 1 capsule (100 mg total) by mouth 2 (two) times daily.  Marland Kitchen latanoprost (XALATAN) 0.005 % ophthalmic solution Place 1 drop into both eyes at bedtime.   . magnesium hydroxide (MILK OF MAGNESIA) 400 MG/5ML suspension Take 30 mLs by mouth daily as needed for mild constipation.  . methocarbamol (ROBAXIN) 500 MG tablet Take 1 tablet (500 mg total) by mouth every 6 (six) hours as needed for muscle spasms.  . polyethylene glycol (MIRALAX / GLYCOLAX) packet Take 17 g by mouth at bedtime.  . senna (SENOKOT) 8.6 MG tablet Take 2 tablets by mouth 2 (two) times daily.  Marland Kitchen telmisartan (MICARDIS) 80 MG tablet Take 80 mg by mouth daily.   No facility-administered encounter medications on file as of 02/04/2018.      SIGNIFICANT DIAGNOSTIC EXAMS  PREVIOUS:   01-24-18: left hip pelvic x-ray: 1. Nondisplaced right subcapital femoral neck fracture.  01-24-18: chest x-ray: No evidence of active cardiopulmonary disease.  NO NEW EXAMS   LABS REVIEWED PREVIOUS:   01-24-18: wbc 8.9; hgb 13.3; hct 41.1; mcv 93.4; plt 169; glucose 104; bun 13; creat 0.66; k+ 4.1; na++ 134; ca 9.3; liver normal albumin 4.1  01-25-18: wbc 8.6; hgb 11.3; hct 35.1; mcv 94.1; plt 193; glucose 139; bun 11; creat 0.59; k+ 3.9; na+ +136; ca 8.4   TODAY:    01-25-18: wbc 8.6; hgb 11.3; hct 35.1; mcv 94.1; plt 193    Review of Systems  Constitutional: Negative for malaise/fatigue.  Respiratory: Negative for cough and shortness of breath.   Cardiovascular: Negative for chest pain, palpitations and leg swelling.  Gastrointestinal: Negative for abdominal pain, constipation and heartburn.  Musculoskeletal: Negative for back pain, joint pain and myalgias.  Skin: Negative.   Neurological: Positive for dizziness.       Has been light headed due to low blood pressure readings   Psychiatric/Behavioral: The patient is not nervous/anxious.     Physical Exam  Constitutional: She is oriented to person, place, and time. She appears well-developed and well-nourished. No distress.  Neck: No thyromegaly present.  Cardiovascular: Normal rate, regular rhythm, normal heart sounds and intact distal pulses.  Pulmonary/Chest: Effort normal and breath sounds normal. No respiratory distress.  Abdominal: Soft. Bowel sounds are normal. She exhibits no distension. There is no tenderness.  Musculoskeletal: She exhibits no edema.  Is able to move all extremities Is status post right hip fracture    Lymphadenopathy:    She has no cervical adenopathy.  Neurological: She is alert and oriented to person, place, and time.  Skin: Skin is warm and dry. She is not diaphoretic.  Psychiatric: She has a normal mood and affect.      ASSESSMENT/ PLAN:  TODAY:   1. Chronic gout of multiple sites: is stable will continue colchicine 0.6 mg twice daily as needed   2. Glaucoma: is stable will continue xalatan to both eyes and combigan to both eyes twice daily   3. Subcapital fracture of femur, right closed, with routine healing subsequent encounter: is stable will continue therapy as directed and will follow up with orthopedics; will continue asa 325 mg  twice daily ; tylenol three times daily as needed has  robaxin 500 mg every 6 hours as needed.   PREVIOUS   4. Benign  essential hypertension: is stable b/p 149/78: will continue micardis 80 mg daily coreg 6.25 mg twice daily   5. Chronic systolic CHF (congestive heart failure) NYHA class 2: is stable will continue coreg 6.25 mg twice daily   6. Chronic constipation: is stable; will continue colace twice daily senna 2 tabs twice daily and miralax daily      MD is aware of resident's narcotic use and is in agreement with current plan of care. We will attempt to wean resident as apropriate   Ok Edwards NP [facility]  Contact [phone] Monday through Friday 8am- 5pm  After hours call [phone]

## 2018-02-05 ENCOUNTER — Non-Acute Institutional Stay (SKILLED_NURSING_FACILITY): Payer: Medicare Other | Admitting: Adult Health

## 2018-02-05 DIAGNOSIS — I10 Essential (primary) hypertension: Secondary | ICD-10-CM | POA: Diagnosis not present

## 2018-02-06 ENCOUNTER — Encounter: Payer: Self-pay | Admitting: Adult Health

## 2018-02-06 NOTE — Progress Notes (Signed)
[facility]   [facility] Room Number: [place] of Service:  SNF (31)   CODE STATUS: DNR  Allergies  Allergen Reactions  . Dog Epithelium Allergy Skin Test Shortness Of Breath    Seizure  . Penicillin G Hives    AGE 84 yo PCN allergy  . Sulfa Antibiotics Other (See Comments) and Anaphylaxis  . Sulfamethoxazole-Trimethoprim Itching, Palpitations, Other (See Comments), Rash and Swelling    Shaking, redness, sneezing  . Uncaria Tomentosa (Cats Claw) Anaphylaxis    Seizure  . Guaifenesin Other (See Comments)    Insomnia and anxiety  . Levofloxacin Other (See Comments)    750mg  dosage after 24 hours caused confusion, off-balanced CNS  . Metronidazole Nausea Only    Abdominal cramping  Gi upset  . Molds & Smuts Cough  . Penicillins Swelling  . Articaine-Epinephrine Other (See Comments) and Swelling    At site, nerve loss for few days, used by dentist  . Erythromycin Other (See Comments), Nausea Only and Rash  . Erythromycin Base Rash    GI upset  . Hydralazine Hcl Other (See Comments)    hyponatremia  . Nitrofurantoin Rash and Nausea Only  . Other Other (See Comments)    septocaine caused numbness septocaine caused numbness  . Propoxyphene Other (See Comments)    Altered mental status  . Spinach Palpitations    Irregular heartbeat  . Statins Other (See Comments)  . Tape Rash  . Tetracycline Other (See Comments), Nausea Only and Rash  . Tetracyclines & Related Rash    Chief Complaint  Patient presents with  . Acute Visit    Low Blood Pressure    HPI:  She continues to have low blood pressure readings from 70-90/40/50's. She does feel weak and light headed. She denies any chest pain; no blurring of vision; no weakness of extremities. She feels as though the readings are due to her coreg.   Past Medical History:  Diagnosis Date  . Arthritis    fingers, knees  . Cancer ([place])    melanoma  . CHF (congestive heart failure) ([place])   .  Complication of anesthesia    Pt reports "coded" during spinal injection during back surgery - 8 yrs ago - [place], [place]  . Diverticulitis   . Environmental and seasonal allergies   . Hypercholesteremia   . Hypertension   . Left arm weakness    positional  . Left bundle branch block (LBBB)   . Motion sickness    [place]  . Seizures ([place]) 06/2013   reaction to exposure to allergan (dogs and cats)  . Vasovagal episode     Past Surgical History:  Procedure Laterality Date  . BACK SURGERY     multiple  . CATARACT EXTRACTION W/ INTRAOCULAR LENS  IMPLANT, BILATERAL    . COLONOSCOPY WITH PROPOFOL N/A 06/27/2016   Procedure: COLONOSCOPY WITH PROPOFOL;  Surgeon: Manya Silvas, MD;  [facility] END[facility]COPY;  Service: Endoscopy;  Laterality: N/A;  . HAMMERTOE RECONSTRUCTION WITH WEIL OSTEOTOMY Left 08/29/2006  . JOINT REPLACEMENT  08/27/2014   knee  . KNEE ARTHROCENTESIS Left 04/26/2008  . LUMBAR LAMINECTOMY N/A 06/25/2015  . MASS EXCISION Left 12/15/2014   Procedure: MINOR EXCISION OF MASS LEFT INCLUSION CYST;  Surgeon: Beverly Gust, MD;  Location: [place];  Service: ENT;  Laterality: Left;  NEEDS 2 NURSE  . MELANOMA EXCISION Right 03/14/2016  . ROTATOR CUFF REPAIR Bilateral   . toric lens Left 07/15/12 and 08/05/12  .  TOTAL HIP ARTHROPLASTY Right 01/24/2018   Procedure: TOTAL HIP ARTHROPLASTY ANTERIOR APPROACH;  Surgeon: Hessie Knows, MD;  Location: [facility] ORS;  Service: Orthopedics;  Laterality: Right;    Social History   Socioeconomic History  . Marital status: Widowed    Spouse name: Not on file  . Number of children: Not on file  . Years of education: Not on file  . Highest education level: Not on file  Occupational History  . Not on file  Social Needs  . Financial resource strain: Not on file  . Food insecurity:    Worry: Not on file    Inability: Not on file  . Transportation needs:    Medical: Not on file    Non-medical: Not on file  Tobacco Use  .  Smoking status: Never Smoker  . Smokeless tobacco: Never Used  Substance and Sexual Activity  . Alcohol use: No  . Drug use: No  . Sexual activity: Not on file  Lifestyle  . Physical activity:    Days per week: Not on file    Minutes per session: Not on file  . Stress: Not on file  Relationships  . Social connections:    Talks on phone: Not on file    Gets together: Not on file    Attends religious service: Not on file    Active member of club or organization: Not on file    Attends meetings of clubs or organizations: Not on file    Relationship status: Not on file  . Intimate partner violence:    Fear of current or ex partner: Not on file    Emotionally abused: Not on file    Physically abused: Not on file    Forced sexual activity: Not on file  Other Topics Concern  . Not on file  Social History Narrative  . Not on file   Family History  Problem Relation Age of Onset  . Kidney Stones Mother   . Ovarian cancer Mother   . Heart disease Mother   . Heart disease Father   . Alzheimer's disease Sister   . Heart disease Maternal Grandmother   . Heart disease Maternal Grandfather   . Heart disease Paternal Grandmother   . Kidney disease Neg Hx       VITAL SIGNS BP (!) 92/50   Pulse 78   Temp 97.8 F (36.6 C)   Resp 20   Ht 5\' 2"  (1.575 m)   Wt 135 lb (61.2 kg)   SpO2 100%   BMI 24.69 kg/m   Outpatient Encounter Medications as of 02/05/2018  Medication Sig  . acetaminophen (TYLENOL) 325 MG tablet Take 650 mg by mouth every 4 (four) hours as needed for mild pain or fever.  Marland Kitchen aspirin 325 MG tablet Take 325 mg by mouth 2 (two) times daily.  . brimonidine-timolol (COMBIGAN) 0.2-0.5 % ophthalmic solution Place 1 drop into both eyes every 12 (twelve) hours.  . carvedilol (COREG) 3.125 MG tablet Take 1 tablet by mouth 2 (two) times daily.   . Cholecalciferol 100 MCG (4000 UT) CAPS Take 1 capsule by mouth daily.  . Cranberry 450 MG CAPS Take 2 tablets by mouth daily.    Marland Kitchen docusate sodium (COLACE) 100 MG capsule Take 1 capsule (100 mg total) by mouth 2 (two) times daily.  Marland Kitchen latanoprost (XALATAN) 0.005 % ophthalmic solution Place 1 drop into both eyes at bedtime.   . magnesium hydroxide (MILK OF MAGNESIA) 400 MG/5ML suspension Take 30 mLs by  mouth daily as needed for mild constipation.  . methocarbamol (ROBAXIN) 500 MG tablet Take 1 tablet (500 mg total) by mouth every 6 (six) hours as needed for muscle spasms.  . NON FORMULARY Diet Type: regular  . polyethylene glycol (MIRALAX / GLYCOLAX) packet Take 17 g by mouth at bedtime.  . senna (SENOKOT) 8.6 MG tablet Take 2 tablets by mouth 2 (two) times daily.  Marland Kitchen telmisartan (MICARDIS) 80 MG tablet Take 80 mg by mouth daily.  . colchicine 0.6 MG tablet Take 0.6 mg by mouth 2 (two) times daily as needed.    No facility-administered encounter medications on file as of 02/05/2018.      SIGNIFICANT DIAGNOSTIC EXAMS   PREVIOUS:   01-24-18: left hip pelvic x-ray: 1. Nondisplaced right subcapital femoral neck fracture.  01-24-18: chest x-ray: No evidence of active cardiopulmonary disease.  NO NEW EXAMS   LABS REVIEWED PREVIOUS:   01-24-18: wbc 8.9; hgb 13.3; hct 41.1; mcv 93.4; plt 169; glucose 104; bun 13; creat 0.66; k+ 4.1; na++ 134; ca 9.3; liver normal albumin 4.1  01-25-18: wbc 8.6; hgb 11.3; hct 35.1; mcv 94.1; plt 193; glucose 139; bun 11; creat 0.59; k+ 3.9; na+ +136; ca 8.4   NO NEW LABS.    Review of Systems  Constitutional: Negative for malaise/fatigue.  Respiratory: Negative for cough and shortness of breath.   Cardiovascular: Negative for chest pain, palpitations and leg swelling.  Gastrointestinal: Negative for abdominal pain, constipation and heartburn.  Musculoskeletal: Negative for back pain, joint pain and myalgias.  Skin: Negative.   Neurological: Positive for dizziness.  Psychiatric/Behavioral: The patient is not nervous/anxious.     Physical Exam  Constitutional: She is oriented to  person, place, and time. She appears well-developed and well-nourished. No distress.  Neck: No thyromegaly present.  Cardiovascular: Normal rate, regular rhythm, normal heart sounds and intact distal pulses.  Pulmonary/Chest: Effort normal and breath sounds normal. No respiratory distress.  Abdominal: Soft. Bowel sounds are normal. She exhibits no distension. There is no tenderness.  Musculoskeletal: She exhibits no edema.  Is able to move all extremities Is status post right hip fracture     Lymphadenopathy:    She has no cervical adenopathy.  Neurological: She is alert and oriented to person, place, and time.  Skin: Skin is warm and dry. She is not diaphoretic.  Right hip incision without signs of infection present.   Psychiatric: She has a normal mood and affect.    ASSESSMENT/ PLAN:  TODAY:   1.  Benign essential hypertension: is worse b/p 92/50 : will continue micardis 80 mg daily will lower coreg to 3.215 mg twice daily and will hold for systolic <710.    MD is aware of resident's narcotic use and is in agreement with current plan of care. We will attempt to wean resident as apropriate   Ok Edwards NP [facility]  Contact [phone] Monday through Friday 8am- 5pm  After hours call [phone]

## 2018-02-11 ENCOUNTER — Non-Acute Institutional Stay (SKILLED_NURSING_FACILITY): Payer: Medicare Other | Admitting: Adult Health

## 2018-02-11 ENCOUNTER — Encounter: Payer: Self-pay | Admitting: Adult Health

## 2018-02-11 DIAGNOSIS — K5909 Other constipation: Secondary | ICD-10-CM

## 2018-02-11 DIAGNOSIS — I5022 Chronic systolic (congestive) heart failure: Secondary | ICD-10-CM | POA: Diagnosis not present

## 2018-02-11 DIAGNOSIS — I10 Essential (primary) hypertension: Secondary | ICD-10-CM | POA: Diagnosis not present

## 2018-02-11 NOTE — Progress Notes (Signed)
[facility] Room Number: [place] of Service:  SNF (31)   CODE STATUS: DNR  Allergies  Allergen Reactions  . Dog Epithelium Allergy Skin Test Shortness Of Breath    Seizure  . Penicillin G Hives    AGE 84 yo PCN allergy  . Sulfa Antibiotics Other (See Comments) and Anaphylaxis  . Sulfamethoxazole-Trimethoprim Itching, Palpitations, Other (See Comments), Rash and Swelling    Shaking, redness, sneezing  . Uncaria Tomentosa (Cats Claw) Anaphylaxis    Seizure  . Guaifenesin Other (See Comments)    Insomnia and anxiety  . Levofloxacin Other (See Comments)    750mg  dosage after 24 hours caused confusion, off-balanced CNS  . Metronidazole Nausea Only    Abdominal cramping  Gi upset  . Molds & Smuts Cough  . Penicillins Swelling  . Articaine-Epinephrine Other (See Comments) and Swelling    At site, nerve loss for few days, used by dentist  . Erythromycin Other (See Comments), Nausea Only and Rash  . Erythromycin Base Rash    GI upset  . Hydralazine Hcl Other (See Comments)    hyponatremia  . Nitrofurantoin Rash and Nausea Only  . Other Other (See Comments)    septocaine caused numbness septocaine caused numbness  . Propoxyphene Other (See Comments)    Altered mental status  . Spinach Palpitations    Irregular heartbeat  . Statins Other (See Comments)  . Tape Rash  . Tetracycline Other (See Comments), Nausea Only and Rash  . Tetracyclines & Related Rash    Chief Complaint  Patient presents with  . Medical Management of Chronic Issues    Benign essential hypertension; chronic systolic CHF (congestive heart failure) NYHA class 2; chronic constipation. Weekly follow up for the first 30 days post hospitalization     HPI:  She is a 84 year old short term rehab patient being seen for the management of her chronic illnesses: hypertension; chf; constipation. Her coreg and micardis have been stopped due to poor tolerance. She denies any  constipation; no uncontrolled pain; no dizziness.   Past Medical History:  Diagnosis Date  . Arthritis    fingers, knees  . Cancer ([place])    melanoma  . CHF (congestive heart failure) ([place])   . Complication of anesthesia    Pt reports "coded" during spinal injection during back surgery - 8 yrs ago - [place], [place]  . Diverticulitis   . Environmental and seasonal allergies   . Hypercholesteremia   . Hypertension   . Left arm weakness    positional  . Left bundle branch block (LBBB)   . Motion sickness    seasick  . Seizures ([place]) 06/2013   reaction to exposure to allergan (dogs and cats)  . Vasovagal episode     Past Surgical History:  Procedure Laterality Date  . BACK SURGERY     multiple  . CATARACT EXTRACTION W/ INTRAOCULAR LENS  IMPLANT, BILATERAL    . COLONOSCOPY WITH PROPOFOL N/A 06/27/2016   Procedure: COLONOSCOPY WITH PROPOFOL;  Surgeon: Manya Silvas, MD;  Location: [facility] ENDOSCOPY;  Service: Endoscopy;  Laterality: N/A;  . HAMMERTOE RECONSTRUCTION WITH WEIL OSTEOTOMY Left 08/29/2006  . JOINT REPLACEMENT  08/27/2014   knee  . KNEE ARTHROCENTESIS Left 04/26/2008  . LUMBAR LAMINECTOMY N/A 06/25/2015  . MASS EXCISION Left 12/15/2014   Procedure: MINOR EXCISION OF MASS LEFT INCLUSION CYST;  Surgeon: Beverly Gust, MD;  Location: [place];  Service: ENT;  Laterality:  Left;  NEEDS 2 NURSE  . MELANOMA EXCISION Right 03/14/2016  . ROTATOR CUFF REPAIR Bilateral   . toric lens Left 07/15/12 and 08/05/12  . TOTAL HIP ARTHROPLASTY Right 01/24/2018   Procedure: TOTAL HIP ARTHROPLASTY ANTERIOR APPROACH;  Surgeon: Hessie Knows, MD;  Location: [facility] ORS;  Service: Orthopedics;  Laterality: Right;    Social History   Socioeconomic History  . Marital status: Widowed    Spouse name: Not on file  . Number of children: Not on file  . Years of education: Not on file  . Highest education level: Not on file  Occupational History  . Not on file  Social Needs  . Financial  resource strain: Not on file  . Food insecurity:    Worry: Not on file    Inability: Not on file  . Transportation needs:    Medical: Not on file    Non-medical: Not on file  Tobacco Use  . Smoking status: Never Smoker  . Smokeless tobacco: Never Used  Substance and Sexual Activity  . Alcohol use: No  . Drug use: No  . Sexual activity: Not on file  Lifestyle  . Physical activity:    Days per week: Not on file    Minutes per session: Not on file  . Stress: Not on file  Relationships  . Social connections:    Talks on phone: Not on file    Gets together: Not on file    Attends religious service: Not on file    Active member of club or organization: Not on file    Attends meetings of clubs or organizations: Not on file    Relationship status: Not on file  . Intimate partner violence:    Fear of current or ex partner: Not on file    Emotionally abused: Not on file    Physically abused: Not on file    Forced sexual activity: Not on file  Other Topics Concern  . Not on file  Social History Narrative  . Not on file   Family History  Problem Relation Age of Onset  . Kidney Stones Mother   . Ovarian cancer Mother   . Heart disease Mother   . Heart disease Father   . Alzheimer's disease Sister   . Heart disease Maternal Grandmother   . Heart disease Maternal Grandfather   . Heart disease Paternal Grandmother   . Kidney disease Neg Hx       VITAL SIGNS BP (!) 149/77   Pulse 84   Temp 98.8 F (37.1 C)   Resp 20   Ht 5\' 2"  (1.575 m)   Wt 135 lb (61.2 kg)   SpO2 98%   BMI 24.69 kg/m   Outpatient Encounter Medications as of 02/11/2018  Medication Sig  . acetaminophen (TYLENOL) 325 MG tablet Take 650 mg by mouth every 4 (four) hours as needed for mild pain or fever.  Marland Kitchen aspirin 325 MG tablet Take 325 mg by mouth 2 (two) times daily.  . brimonidine-timolol (COMBIGAN) 0.2-0.5 % ophthalmic solution Place 1 drop into both eyes every 12 (twelve) hours.  .  Cholecalciferol 100 MCG (4000 UT) CAPS Take 1 capsule by mouth daily.  . Cranberry 450 MG CAPS Take 2 tablets by mouth daily.  Marland Kitchen latanoprost (XALATAN) 0.005 % ophthalmic solution Place 1 drop into both eyes at bedtime.   . magnesium hydroxide (MILK OF MAGNESIA) 400 MG/5ML suspension Take 30 mLs by mouth daily as needed for mild constipation.  . methocarbamol (  ROBAXIN) 500 MG tablet Take 1 tablet (500 mg total) by mouth every 6 (six) hours as needed for muscle spasms.  . NON FORMULARY Diet Type: regular   No facility-administered encounter medications on file as of 02/11/2018.      SIGNIFICANT DIAGNOSTIC EXAMS  PREVIOUS:   01-24-18: left hip pelvic x-ray: 1. Nondisplaced right subcapital femoral neck fracture.  01-24-18: chest x-ray: No evidence of active cardiopulmonary disease.  NO NEW EXAMS   LABS REVIEWED PREVIOUS:   01-24-18: wbc 8.9; hgb 13.3; hct 41.1; mcv 93.4; plt 169; glucose 104; bun 13; creat 0.66; k+ 4.1; na++ 134; ca 9.3; liver normal albumin 4.1  01-25-18: wbc 8.6; hgb 11.3; hct 35.1; mcv 94.1; plt 193; glucose 139; bun 11; creat 0.59; k+ 3.9; na+ +136; ca 8.4   NO NEW LABS.   Review of Systems  Constitutional: Negative for malaise/fatigue.  Respiratory: Negative for cough and shortness of breath.   Cardiovascular: Negative for chest pain, palpitations and leg swelling.  Gastrointestinal: Negative for abdominal pain, constipation and heartburn.  Musculoskeletal: Negative for back pain, joint pain and myalgias.  Skin: Negative.   Neurological: Negative for dizziness.  Psychiatric/Behavioral: The patient is not nervous/anxious.     Physical Exam  Constitutional: She is oriented to person, place, and time. She appears well-developed and well-nourished. No distress.  Neck: No thyromegaly present.  Cardiovascular: Normal rate, regular rhythm, normal heart sounds and intact distal pulses.  Pulmonary/Chest: Effort normal and breath sounds normal. No respiratory distress.   Abdominal: Soft. Bowel sounds are normal. She exhibits no distension. There is no tenderness.  Musculoskeletal: She exhibits no edema.  Is able to move all extremities Is status post right hip fracture   Lymphadenopathy:    She has no cervical adenopathy.  Neurological: She is alert and oriented to person, place, and time.  Skin: Skin is warm and dry. She is not diaphoretic.  Right hip incision without signs of infection present.    Psychiatric: She has a normal mood and affect.    ASSESSMENT/ PLAN:  TODAY:   1. Benign essential hypertension: is stable b/p 149/77: the coreg and micardis have been stopped.   2. Chronic systolic CHF (congestive heart failure) NYHA class 2: is stable will continue coreg stopped she is unable to tolerate.   3. Chronic constipation: is stable; will monitor   PREVIOUS   4. Glaucoma: is stable will continue xalatan to both eyes and combigan to both eyes twice daily   5. Subcapital fracture of femur, right closed, with routine healing subsequent encounter: is stable will continue therapy as directed and will follow up with orthopedics; will continue asa 325 mg twice daily ;  robaxin 500 mg every 6 hours as needed and tylenol 650 mg every 4 hours as needed    MD is aware of resident's narcotic use and is in agreement with current plan of care. We will attempt to wean resident as apropriate   Ok Edwards NP [facility]  Contact [phone] Monday through Friday 8am- 5pm  After hours call [phone]

## 2018-02-18 ENCOUNTER — Other Ambulatory Visit: Payer: Self-pay | Admitting: Adult Health

## 2018-02-18 ENCOUNTER — Non-Acute Institutional Stay (SKILLED_NURSING_FACILITY): Payer: Medicare Other | Admitting: Adult Health

## 2018-02-18 ENCOUNTER — Encounter: Payer: Self-pay | Admitting: Adult Health

## 2018-02-18 DIAGNOSIS — I5022 Chronic systolic (congestive) heart failure: Secondary | ICD-10-CM

## 2018-02-18 DIAGNOSIS — S72011D Unspecified intracapsular fracture of right femur, subsequent encounter for closed fracture with routine healing: Secondary | ICD-10-CM

## 2018-02-18 DIAGNOSIS — I10 Essential (primary) hypertension: Secondary | ICD-10-CM

## 2018-02-18 MED ORDER — LATANOPROST 0.005 % OP SOLN: 1.0000 [drp] | Freq: Every day | OPHTHALMIC | 0 refills | Status: AC

## 2018-02-18 MED ORDER — METHOCARBAMOL 500 MG PO TABS: 500.0000 mg | ORAL_TABLET | Freq: Four times a day (QID) | ORAL | 0 refills | Status: DC | PRN

## 2018-02-18 MED ORDER — BRIMONIDINE TARTRATE-TIMOLOL 0.2-0.5 % OP SOLN: 1.0000 [drp] | Freq: Two times a day (BID) | OPHTHALMIC | 0 refills | Status: DC

## 2018-02-18 NOTE — Progress Notes (Signed)
[facility]   [facility] Room Number: [place] of Service:  SNF (31)    CODE STATUS: DNR  Allergies  Allergen Reactions  . Dog Epithelium Allergy Skin Test Shortness Of Breath    Seizure  . Penicillin G Hives    AGE 84 yo PCN allergy  . Sulfa Antibiotics Other (See Comments) and Anaphylaxis  . Sulfamethoxazole-Trimethoprim Itching, Palpitations, Other (See Comments), Rash and Swelling    Shaking, redness, sneezing  . Uncaria Tomentosa (Cats Claw) Anaphylaxis    Seizure  . Guaifenesin Other (See Comments)    Insomnia and anxiety  . Levofloxacin Other (See Comments)    750mg  dosage after 24 hours caused confusion, off-balanced CNS  . Metronidazole Nausea Only    Abdominal cramping  Gi upset  . Molds & Smuts Cough  . Penicillins Swelling  . Articaine-Epinephrine Other (See Comments) and Swelling    At site, nerve loss for few days, used by dentist  . Erythromycin Other (See Comments), Nausea Only and Rash  . Erythromycin Base Rash    GI upset  . Hydralazine Hcl Other (See Comments)    hyponatremia  . Nitrofurantoin Rash and Nausea Only  . Other Other (See Comments)    septocaine caused numbness septocaine caused numbness  . Propoxyphene Other (See Comments)    Altered mental status  . Spinach Palpitations    Irregular heartbeat  . Statins Other (See Comments)  . Tape Rash  . Tetracycline Other (See Comments), Nausea Only and Rash  . Tetracyclines & Related Rash    Chief Complaint  Patient presents with  . Discharge Note    Discharging to home on 02/20/18    HPI:  She is being discharged to home with home health for pt/rn. She will need a 3:1 commode; will need her prescriptions written and will need to follow up with her medical provider.  She was hospitalized for right femoral fracture. She was admitted to this facility for short term rehab and is ready to go home to complete her therapy.     Past Medical History:  Diagnosis  Date  . Arthritis    fingers, knees  . Cancer ([place])    melanoma  . CHF (congestive heart failure) ([place])   . Complication of anesthesia    Pt reports "coded" during spinal injection during back surgery - 8 yrs ago - [place], [place]  . Diverticulitis   . Environmental and seasonal allergies   . Hypercholesteremia   . Hypertension   . Left arm weakness    positional  . Left bundle branch block (LBBB)   . Motion sickness    seasick  . Seizures ([place]) 06/2013   reaction to exposure to allergan (dogs and cats)  . Vasovagal episode     Past Surgical History:  Procedure Laterality Date  . BACK SURGERY     multiple  . CATARACT EXTRACTION W/ INTRAOCULAR LENS  IMPLANT, BILATERAL    . COLONOSCOPY WITH PROPOFOL N/A 06/27/2016   Procedure: COLONOSCOPY WITH PROPOFOL;  Surgeon: Manya Silvas, MD;  Location[facility]COPY;  Service: Endoscopy;  Laterality: N/A;  . HAMMERTOE RECONSTRUCTION WITH WEIL OSTEOTOMY Left 08/29/2006  . JOINT REPLACEMENT  08/27/2014   knee  . KNEE ARTHROCENTESIS Left 04/26/2008  . LUMBAR LAMINECTOMY N/A 06/25/2015  . MASS EXCISION Left 12/15/2014   Procedure: MINOR EXCISION OF MASS LEFT INCLUSION CYST;  Surgeon: Beverly Gust, MD;  Location: [place];  Service: ENT;  Laterality: Left;  NEEDS 2 NURSE  . MELANOMA EXCISION Right 03/14/2016  . ROTATOR CUFF REPAIR Bilateral   . toric lens Left 07/15/12 and 08/05/12  . TOTAL HIP ARTHROPLASTY Right 01/24/2018   Procedure: TOTAL HIP ARTHROPLASTY ANTERIOR APPROACH;  Surgeon: Hessie Knows, MD;  Location: [facility] ORS;  Service: Orthopedics;  Laterality: Right;    Social History   Socioeconomic History  . Marital status: Widowed    Spouse name: Not on file  . Number of children: Not on file  . Years of education: Not on file  . Highest education level: Not on file  Occupational History  . Not on file  Social Needs  . Financial resource strain: Not on file  . Food insecurity:    Worry: Not on file    Inability:  Not on file  . Transportation needs:    Medical: Not on file    Non-medical: Not on file  Tobacco Use  . Smoking status: Never Smoker  . Smokeless tobacco: Never Used  Substance and Sexual Activity  . Alcohol use: No  . Drug use: No  . Sexual activity: Not on file  Lifestyle  . Physical activity:    Days per week: Not on file    Minutes per session: Not on file  . Stress: Not on file  Relationships  . Social connections:    Talks on phone: Not on file    Gets together: Not on file    Attends religious service: Not on file    Active member of club or organization: Not on file    Attends meetings of clubs or organizations: Not on file    Relationship status: Not on file  . Intimate partner violence:    Fear of current or ex partner: Not on file    Emotionally abused: Not on file    Physically abused: Not on file    Forced sexual activity: Not on file  Other Topics Concern  . Not on file  Social History Narrative  . Not on file   Family History  Problem Relation Age of Onset  . Kidney Stones Mother   . Ovarian cancer Mother   . Heart disease Mother   . Heart disease Father   . Alzheimer's disease Sister   . Heart disease Maternal Grandmother   . Heart disease Maternal Grandfather   . Heart disease Paternal Grandmother   . Kidney disease Neg Hx     VITAL SIGNS BP (!) 145/75   Pulse 82   Temp 98.1 F (36.7 C)   Resp 16   Ht 5\' 2"  (1.575 m)   Wt 134 lb 14.4 oz (61.2 kg)   SpO2 100%   BMI 24.67 kg/m   Patient's Medications  New Prescriptions   No medications on file  Previous Medications   ACETAMINOPHEN (TYLENOL) 325 MG TABLET    Take 650 mg by mouth every 4 (four) hours as needed for mild pain or fever.   ASPIRIN 325 MG TABLET    Take 325 mg by mouth 2 (two) times daily.   BRIMONIDINE-TIMOLOL (COMBIGAN) 0.2-0.5 % OPHTHALMIC SOLUTION    Place 1 drop into both eyes every 12 (twelve) hours.   CHOLECALCIFEROL 100 MCG (4000 UT) CAPS    Take 1 capsule by mouth  daily.   CRANBERRY 450 MG CAPS    Take 2 tablets by mouth daily.   LATANOPROST (XALATAN) 0.005 % OPHTHALMIC SOLUTION    Place 1 drop into both eyes at bedtime.    MAGNESIUM  HYDROXIDE (MILK OF MAGNESIA) 400 MG/5ML SUSPENSION    Take 30 mLs by mouth daily as needed for mild constipation.   METHOCARBAMOL (ROBAXIN) 500 MG TABLET    Take 1 tablet (500 mg total) by mouth every 6 (six) hours as needed for muscle spasms.   NON FORMULARY    Diet Type: regular  Modified Medications   No medications on file  Discontinued Medications   No medications on file     SIGNIFICANT DIAGNOSTIC EXAMS  PREVIOUS:   01-24-18: left hip pelvic x-ray: 1. Nondisplaced right subcapital femoral neck fracture.  01-24-18: chest x-ray: No evidence of active cardiopulmonary disease.  NO NEW EXAMS   LABS REVIEWED PREVIOUS:   01-24-18: wbc 8.9; hgb 13.3; hct 41.1; mcv 93.4; plt 169; glucose 104; bun 13; creat 0.66; k+ 4.1; na++ 134; ca 9.3; liver normal albumin 4.1  01-25-18: wbc 8.6; hgb 11.3; hct 35.1; mcv 94.1; plt 193; glucose 139; bun 11; creat 0.59; k+ 3.9; na+ +136; ca 8.4   NO NEW LABS.   Review of Systems  Constitutional: Negative for malaise/fatigue.  Respiratory: Negative for cough and shortness of breath.   Cardiovascular: Negative for chest pain, palpitations and leg swelling.  Gastrointestinal: Negative for abdominal pain, constipation and heartburn.  Musculoskeletal: Negative for back pain, joint pain and myalgias.  Skin: Negative.   Neurological: Negative for dizziness.  Psychiatric/Behavioral: The patient is not nervous/anxious.      Physical Exam  Constitutional: She is oriented to person, place, and time. She appears well-developed and well-nourished. No distress.  Neck: No thyromegaly present.  Cardiovascular: Normal rate, regular rhythm, normal heart sounds and intact distal pulses.  Pulmonary/Chest: Effort normal and breath sounds normal. No respiratory distress.  Abdominal: Soft. Bowel  sounds are normal. She exhibits no distension. There is no tenderness.  Musculoskeletal: She exhibits no edema.  Is able to move all extremities Is status post right hip fracture    Lymphadenopathy:    She has no cervical adenopathy.  Neurological: She is alert and oriented to person, place, and time.  Skin: Skin is warm and dry. She is not diaphoretic.  Right hip incision without signs of infection present.     Psychiatric: She has a normal mood and affect.   ASSESSMENT/ PLAN:  Patient is being discharged with the following home health services:  Pt/rn: to evaluate and treat as indicated for gait balance strength and medication management   Patient is being discharged with the following durable medical equipment:  3:1 commode   Patient has been advised to f/u with their PCP in 1-2 weeks to bring them up to date on their rehab stay.  Social services at facility was responsible for arranging this appointment.  Pt was provided with a 30 day supply of prescriptions for medications and refills must be obtained from their PCP.  For controlled substances, a more limited supply may be provided adequate until PCP appointment only.  A 30 day supply of her prescription medications have seen electronically sent to [facility] on [address] and [address]  Time spent with patient: 35 minutes discussed medications; dme and home health needs; verbalized understanding.    Ok Edwards NP [facility]  Contact [phone] Monday through Friday 8am- 5pm  After hours call [phone]

## 2019-07-03 ENCOUNTER — Other Ambulatory Visit: Payer: Self-pay | Admitting: Family Medicine

## 2019-07-03 DIAGNOSIS — R519 Headache, unspecified: Secondary | ICD-10-CM

## 2019-07-03 DIAGNOSIS — H539 Unspecified visual disturbance: Secondary | ICD-10-CM

## 2019-07-13 ENCOUNTER — Other Ambulatory Visit: Payer: Self-pay

## 2019-07-13 ENCOUNTER — Ambulatory Visit
Admission: RE | Admit: 2019-07-13 | Discharge: 2019-07-13 | Disposition: A | Discharge status: Home / Self Care | Payer: Medicare Other | Source: Ambulatory Visit | Attending: Family Medicine | Admitting: Family Medicine

## 2019-07-13 DIAGNOSIS — R519 Headache, unspecified: Secondary | ICD-10-CM | POA: Diagnosis present

## 2019-07-13 DIAGNOSIS — H539 Unspecified visual disturbance: Secondary | ICD-10-CM | POA: Insufficient documentation

## 2020-11-03 ENCOUNTER — Other Ambulatory Visit: Payer: Self-pay | Admitting: Podiatry

## 2020-11-04 ENCOUNTER — Other Ambulatory Visit (INDEPENDENT_AMBULATORY_CARE_PROVIDER_SITE_OTHER): Payer: Self-pay | Admitting: Podiatry

## 2020-11-04 DIAGNOSIS — I739 Peripheral vascular disease, unspecified: Secondary | ICD-10-CM

## 2020-11-04 DIAGNOSIS — M79672 Pain in left foot: Secondary | ICD-10-CM

## 2020-11-04 DIAGNOSIS — M2042 Other hammer toe(s) (acquired), left foot: Secondary | ICD-10-CM

## 2020-11-04 DIAGNOSIS — G6289 Other specified polyneuropathies: Secondary | ICD-10-CM

## 2020-11-09 ENCOUNTER — Inpatient Hospital Stay: Admission: RE | Admit: 2020-11-09 | Payer: Medicare Other | Source: Ambulatory Visit

## 2020-11-11 ENCOUNTER — Ambulatory Visit (INDEPENDENT_AMBULATORY_CARE_PROVIDER_SITE_OTHER): Payer: Medicare Other | Admitting: Nurse Practitioner

## 2020-11-11 ENCOUNTER — Other Ambulatory Visit: Payer: Self-pay

## 2020-11-11 ENCOUNTER — Ambulatory Visit (INDEPENDENT_AMBULATORY_CARE_PROVIDER_SITE_OTHER): Payer: Medicare Other

## 2020-11-11 VITALS — BP 162/76 | HR 76 | Ht 63.0 in | Wt 141.0 lb

## 2020-11-11 DIAGNOSIS — G6289 Other specified polyneuropathies: Secondary | ICD-10-CM

## 2020-11-11 DIAGNOSIS — I739 Peripheral vascular disease, unspecified: Secondary | ICD-10-CM

## 2020-11-11 DIAGNOSIS — E782 Mixed hyperlipidemia: Secondary | ICD-10-CM | POA: Diagnosis not present

## 2020-11-11 DIAGNOSIS — M79672 Pain in left foot: Secondary | ICD-10-CM | POA: Diagnosis not present

## 2020-11-11 DIAGNOSIS — M2042 Other hammer toe(s) (acquired), left foot: Secondary | ICD-10-CM

## 2020-11-15 NOTE — Progress Notes (Signed)
Subjective:    Patient ID: Maria [name], female    DOB: Jan 20, 1931, 87 y.o.   MRN: [id number] Chief Complaint  Patient presents with   New Patient (Initial Visit)    NP baker PVD LT foot pain. abi    Maria [name] is an 87 year old female that presents today as a referral from Dr. Luana Shu in regards to left foot pain.  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  She denies any claudication-like symptoms she denies any rest pain.  The patient has had previous ingrown toenails to cause pain.  She denies any open wounds or ulcerations.  Today noninvasive studies show an ABI of 0.99 on the right and 1.04 on the left.  Patient has triphasic tibial artery waveforms bilaterally with good toe waveforms bilaterally.   Review of Systems  Musculoskeletal:  Positive for myalgias.  All other systems reviewed and are negative.     Objective:   Physical Exam Vitals reviewed.  Cardiovascular:     Rate and Rhythm: Normal rate.     Pulses:          Dorsalis pedis pulses are 1+ on the right side and 1+ on the left side.       Posterior tibial pulses are 1+ on the right side and 1+ on the left side.  Pulmonary:     Effort: Pulmonary effort is normal.  Skin:    General: Skin is warm and dry.  Neurological:     Mental Status: She is alert and oriented to person, place, and time.  Psychiatric:        Mood and Affect: Mood normal.        Behavior: Behavior normal.        Thought Content: Thought content normal.        Judgment: Judgment normal.    BP (!) 162/76   Pulse 76   Ht '5\' 3"'$  (1.6 m)   Wt 141 lb (64 kg)   BMI 24.98 kg/m   Past Medical History:  Diagnosis Date   Arthritis    fingers, knees   Cancer (HCC)    melanoma   CHF (congestive heart failure) (HCC)    Complication of anesthesia    Pt reports "coded" during spinal injection during back surgery - 8 yrs ago - [place], [place]   Diverticulitis    Environmental and seasonal allergies    Hypercholesteremia     Hypertension    Left arm weakness    positional   Left bundle branch block (LBBB)    Motion sickness    seasick   Seizures ([place]) 06/2013   reaction to exposure to allergan (dogs and cats)   Vasovagal episode     Social History   Socioeconomic History   Marital status: Widowed    Spouse name: Not on file   Number of children: Not on file   Years of education: Not on file   Highest education level: Not on file  Occupational History   Not on file  Tobacco Use   Smoking status: Never   Smokeless tobacco: Never  Substance and Sexual Activity   Alcohol use: No   Drug use: No   Sexual activity: Not on file  Other Topics Concern   Not on file  Social History Narrative   Not on file   Social Determinants of Health   Financial Resource Strain: Not on file  Food Insecurity: Not on file  Transportation Needs: Not on file  Physical Activity: Not on file  Stress: Not on file  Social Connections: Not on file  Intimate Partner Violence: Not on file    Past Surgical History:  Procedure Laterality Date   BACK SURGERY     multiple   CATARACT EXTRACTION W/ INTRAOCULAR LENS  IMPLANT, BILATERAL     COLONOSCOPY WITH PROPOFOL N/A 06/27/2016   Procedure: COLONOSCOPY WITH PROPOFOL;  Surgeon: Manya Silvas, MD;  Location: [facility] ENDOSCOPY;  Service: Endoscopy;  Laterality: N/A;   HAMMERTOE RECONSTRUCTION WITH WEIL OSTEOTOMY Left 08/29/2006   JOINT REPLACEMENT  08/27/2014   knee   KNEE ARTHROCENTESIS Left 04/26/2008   LUMBAR LAMINECTOMY N/A 06/25/2015   MASS EXCISION Left 12/15/2014   Procedure: MINOR EXCISION OF MASS LEFT INCLUSION CYST;  Surgeon: Beverly Gust, MD;  Location: [facility];  Service: ENT;  Laterality: Left;  NEEDS 2 NURSE   MELANOMA EXCISION Right 03/14/2016   ROTATOR CUFF REPAIR Bilateral    toric lens Left 07/15/12 and 08/05/12   TOTAL HIP ARTHROPLASTY Right 01/24/2018   Procedure: TOTAL HIP ARTHROPLASTY ANTERIOR APPROACH;  Surgeon: Hessie Knows, MD;   Location: [facility] ORS;  Service: Orthopedics;  Laterality: Right;    Family History  Problem Relation Age of Onset   Kidney Stones Mother    Ovarian cancer Mother    Heart disease Mother    Heart disease Father    Alzheimer's disease Sister    Heart disease Maternal Grandmother    Heart disease Maternal Grandfather    Heart disease Paternal Grandmother    Kidney disease Neg Hx     Allergies  Allergen Reactions   Dog Epithelium Allergy Skin Test Shortness Of Breath    Seizure   Penicillin G Hives    AGE 75 yo PCN allergy   Sulfa Antibiotics Other (See Comments) and Anaphylaxis   Sulfamethoxazole-Trimethoprim Itching, Palpitations, Other (See Comments), Rash and Swelling    Shaking, redness, sneezing   Uncaria Tomentosa ([place]) Anaphylaxis    Seizure   Guaifenesin Other (See Comments)    Insomnia and anxiety   Levofloxacin Other (See Comments)    '750mg'$  dosage after 24 hours caused confusion, off-balanced CNS   Metronidazole Nausea Only    Abdominal cramping  Gi upset   Molds & Smuts Cough   Articaine-Epinephrine Other (See Comments) and Swelling    At site, nerve loss for few days, used by dentist   Erythromycin Nausea Only and Rash   Hydralazine Hcl Other (See Comments)    hyponatremia   Nitrofurantoin Rash and Nausea Only   Other Other (See Comments)    septocaine caused numbness    Propoxyphene Other (See Comments)    Altered mental status   Spinach Palpitations    Irregular heartbeat   Statins Other (See Comments)    Unknown reaction   Tape Rash    Prefers paper tape   Tetracycline Other (See Comments), Nausea Only and Rash    CBC Latest Ref Rng & Units 01/27/2018 01/25/2018 01/24/2018  WBC 4.0 - 10.5 K/uL 7.2 8.6 8.9  Hemoglobin 12.0 - 15.0 g/dL 10.3(L) 11.3(L) 13.3  Hematocrit 36.0 - 46.0 % 30.7(L) 35.1(L) 41.1  Platelets 150 - 400 K/uL 169 193 169      CMP     Component Value Date/Time   NA 136 01/25/2018 0433   K 3.9 01/25/2018 0433   CL 105  01/25/2018 0433   CO2 25 01/25/2018 0433   GLUCOSE 139 (H) 01/25/2018 0433   BUN 11 01/25/2018 0433  CREATININE 0.59 01/25/2018 0433   CALCIUM 8.4 (L) 01/25/2018 0433   PROT 7.6 01/24/2018 1021   ALBUMIN 4.2 01/24/2018 1021   AST 27 01/24/2018 1021   ALT 20 01/24/2018 1021   ALKPHOS 61 01/24/2018 1021   BILITOT 1.2 01/24/2018 1021   GFRNONAA >60 01/25/2018 0433   GFRAA >60 01/25/2018 0433     No results found.     Assessment & Plan:   1. Left foot pain I suspect that this is either musculoskeletal cause or due to neuropathic pain.  Patient will continue to follow with podiatry/PCP for further work-up.  2. Other polyneuropathy Could also be contributing to the patient's discomfort as well.  Based on noninvasive studies today as well as the patient's description and pain believe that it is more so related to musculoskeletal causes for possible neuropathy.  3. Peripheral vascular disorder (HCC) Recommend:  I do not find evidence of life style limiting vascular disease. The patient specifically denies life style limitation.  Previous noninvasive studies including ABI's of the legs do not identify critical vascular problems.  The patient should continue walking and begin a more formal exercise program. The patient should continue his antiplatelet therapy and aggressive treatment of the lipid abnormalities.  The patient should begin wearing graduated compression socks 15-20 mmHg strength to control her mild edema.  Patient will follow-up with me on a PRN basis   4. Hyperlipidemia, mixed Good lipid control is important for controlling atherosclerotic disease process.  No medications added today.   Current Outpatient Medications on File Prior to Visit  Medication Sig Dispense Refill   acetaminophen (TYLENOL) 500 MG tablet Take 500-1,000 mg by mouth every 6 (six) hours as needed for moderate pain.     amLODipine (NORVASC) 5 MG tablet Take 5 mg by mouth at bedtime.      brimonidine-timolol (COMBIGAN) 0.2-0.5 % ophthalmic solution Place 1 drop into both eyes every 12 (twelve) hours. 1 Bottle 0   carboxymethylcellulose (REFRESH PLUS) 0.5 % SOLN Place 1 drop into both eyes daily as needed (dry eyes).     Cholecalciferol (VITAMIN D) 50 MCG (2000 UT) tablet Take 2,000 Units by mouth daily.     CRANBERRY PO Take 1,500 mg by mouth daily.     latanoprost (XALATAN) 0.005 % ophthalmic solution Place 1 drop into both eyes at bedtime. 2.5 mL 0   Magnesium 500 MG TABS Take 500 mg by mouth daily.     ramipril (ALTACE) 10 MG capsule Take 10 mg by mouth daily.     zinc gluconate 50 MG tablet Take 50 mg by mouth daily.     No current facility-administered medications on file prior to visit.    There are no Patient Instructions on file for this visit. No follow-ups on file.   Kris Hartmann, NP

## 2020-11-16 ENCOUNTER — Encounter (INDEPENDENT_AMBULATORY_CARE_PROVIDER_SITE_OTHER): Payer: Self-pay | Admitting: Nurse Practitioner

## 2020-11-18 ENCOUNTER — Encounter: Admission: RE | Payer: Self-pay | Source: Home / Self Care

## 2020-11-18 ENCOUNTER — Ambulatory Visit: Admission: RE | Admit: 2020-11-18 | Payer: Medicare Other | Source: Home / Self Care | Admitting: Podiatry

## 2020-11-18 SURGERY — AMPUTATION, TOE
Anesthesia: Choice | Site: Toe | Laterality: Left

## 2022-09-17 ENCOUNTER — Emergency Department: Payer: Medicare Other

## 2022-09-17 ENCOUNTER — Encounter: Payer: Self-pay | Admitting: Internal Medicine

## 2022-09-17 ENCOUNTER — Other Ambulatory Visit: Payer: Self-pay

## 2022-09-17 ENCOUNTER — Inpatient Hospital Stay: Payer: Medicare Other

## 2022-09-17 ENCOUNTER — Inpatient Hospital Stay
Admission: EM | Admit: 2022-09-17 | Discharge: 2022-09-19 | DRG: 065 | Disposition: A | Discharge status: Skilled Nursing Facility | Payer: Medicare Other | Source: Skilled Nursing Facility | Attending: Internal Medicine | Admitting: Internal Medicine

## 2022-09-17 DIAGNOSIS — Z87440 Personal history of urinary (tract) infections: Secondary | ICD-10-CM

## 2022-09-17 DIAGNOSIS — R4781 Slurred speech: Secondary | ICD-10-CM | POA: Diagnosis present

## 2022-09-17 DIAGNOSIS — Z961 Presence of intraocular lens: Secondary | ICD-10-CM | POA: Diagnosis present

## 2022-09-17 DIAGNOSIS — I6389 Other cerebral infarction: Secondary | ICD-10-CM | POA: Diagnosis not present

## 2022-09-17 DIAGNOSIS — I63531 Cerebral infarction due to unspecified occlusion or stenosis of right posterior cerebral artery: Principal | ICD-10-CM | POA: Diagnosis present

## 2022-09-17 DIAGNOSIS — I5022 Chronic systolic (congestive) heart failure: Secondary | ICD-10-CM | POA: Diagnosis present

## 2022-09-17 DIAGNOSIS — Z85820 Personal history of malignant melanoma of skin: Secondary | ICD-10-CM | POA: Diagnosis not present

## 2022-09-17 DIAGNOSIS — I110 Hypertensive heart disease with heart failure: Secondary | ICD-10-CM | POA: Diagnosis present

## 2022-09-17 DIAGNOSIS — Z96641 Presence of right artificial hip joint: Secondary | ICD-10-CM | POA: Diagnosis present

## 2022-09-17 DIAGNOSIS — Z79899 Other long term (current) drug therapy: Secondary | ICD-10-CM

## 2022-09-17 DIAGNOSIS — E7800 Pure hypercholesterolemia, unspecified: Secondary | ICD-10-CM | POA: Diagnosis present

## 2022-09-17 DIAGNOSIS — R29810 Facial weakness: Secondary | ICD-10-CM | POA: Diagnosis present

## 2022-09-17 DIAGNOSIS — Z9841 Cataract extraction status, right eye: Secondary | ICD-10-CM

## 2022-09-17 DIAGNOSIS — R4189 Other symptoms and signs involving cognitive functions and awareness: Secondary | ICD-10-CM | POA: Diagnosis present

## 2022-09-17 DIAGNOSIS — Z8249 Family history of ischemic heart disease and other diseases of the circulatory system: Secondary | ICD-10-CM

## 2022-09-17 DIAGNOSIS — Z881 Allergy status to other antibiotic agents status: Secondary | ICD-10-CM

## 2022-09-17 DIAGNOSIS — Z91018 Allergy to other foods: Secondary | ICD-10-CM

## 2022-09-17 DIAGNOSIS — I447 Left bundle-branch block, unspecified: Secondary | ICD-10-CM | POA: Diagnosis present

## 2022-09-17 DIAGNOSIS — Z91048 Other nonmedicinal substance allergy status: Secondary | ICD-10-CM

## 2022-09-17 DIAGNOSIS — Z9109 Other allergy status, other than to drugs and biological substances: Secondary | ICD-10-CM

## 2022-09-17 DIAGNOSIS — R29702 NIHSS score 2: Secondary | ICD-10-CM | POA: Diagnosis present

## 2022-09-17 DIAGNOSIS — Z888 Allergy status to other drugs, medicaments and biological substances status: Secondary | ICD-10-CM | POA: Diagnosis not present

## 2022-09-17 DIAGNOSIS — R7303 Prediabetes: Secondary | ICD-10-CM | POA: Diagnosis present

## 2022-09-17 DIAGNOSIS — I639 Cerebral infarction, unspecified: Secondary | ICD-10-CM | POA: Diagnosis not present

## 2022-09-17 DIAGNOSIS — Z820 Family history of epilepsy and other diseases of the nervous system: Secondary | ICD-10-CM

## 2022-09-17 DIAGNOSIS — Z882 Allergy status to sulfonamides status: Secondary | ICD-10-CM | POA: Diagnosis not present

## 2022-09-17 DIAGNOSIS — Z8041 Family history of malignant neoplasm of ovary: Secondary | ICD-10-CM | POA: Diagnosis not present

## 2022-09-17 DIAGNOSIS — R410 Disorientation, unspecified: Secondary | ICD-10-CM

## 2022-09-17 DIAGNOSIS — Z9842 Cataract extraction status, left eye: Secondary | ICD-10-CM | POA: Diagnosis not present

## 2022-09-17 LAB — CBC
HCT: 43.3 % (ref 36.0–46.0)
Hemoglobin: 13.9 g/dL (ref 12.0–15.0)
MCH: 29.6 pg (ref 26.0–34.0)
MCHC: 32.1 g/dL (ref 30.0–36.0)
MCV: 92.3 fL (ref 80.0–100.0)
Platelets: 185 10*3/uL (ref 150–400)
RBC: 4.69 MIL/uL (ref 3.87–5.11)
RDW: 13.2 % (ref 11.5–15.5)
WBC: 6.6 10*3/uL (ref 4.0–10.5)
nRBC: 0 % (ref 0.0–0.2)

## 2022-09-17 LAB — APTT: aPTT: 26 seconds (ref 24–36)

## 2022-09-17 LAB — COMPREHENSIVE METABOLIC PANEL
ALT: 17 U/L (ref 0–44)
AST: 22 U/L (ref 15–41)
Albumin: 4.1 g/dL (ref 3.5–5.0)
Alkaline Phosphatase: 69 U/L (ref 38–126)
Anion gap: 8 (ref 5–15)
BUN: 16 mg/dL (ref 8–23)
CO2: 25 mmol/L (ref 22–32)
Calcium: 9.5 mg/dL (ref 8.9–10.3)
Chloride: 102 mmol/L (ref 98–111)
Creatinine, Ser: 0.6 mg/dL (ref 0.44–1.00)
GFR, Estimated: >60 mL/min (ref >60)
Glucose, Bld: 100 mg/dL — ABNORMAL HIGH (ref 70–99)
Potassium: 4 mmol/L (ref 3.5–5.1)
Sodium: 135 mmol/L (ref 135–145)
Total Bilirubin: 0.7 mg/dL (ref 0.3–1.2)
Total Protein: 7.5 g/dL (ref 6.5–8.1)

## 2022-09-17 LAB — DIFFERENTIAL
Abs Immature Granulocytes: 0.03 10*3/uL (ref 0.00–0.07)
Basophils Absolute: 0.1 10*3/uL (ref 0.0–0.1)
Basophils Relative: 1 %
Eosinophils Absolute: 0.3 10*3/uL (ref 0.0–0.5)
Eosinophils Relative: 5 %
Immature Granulocytes: 1 %
Lymphocytes Relative: 22 %
Lymphs Abs: 1.4 10*3/uL (ref 0.7–4.0)
Monocytes Absolute: 0.4 10*3/uL (ref 0.1–1.0)
Monocytes Relative: 7 %
Neutro Abs: 4.3 10*3/uL (ref 1.7–7.7)
Neutrophils Relative %: 64 %

## 2022-09-17 LAB — URINALYSIS, ROUTINE W REFLEX MICROSCOPIC
Bilirubin Urine: NEGATIVE
Glucose, UA: NEGATIVE mg/dL
Hgb urine dipstick: NEGATIVE
Ketones, ur: NEGATIVE mg/dL
Leukocytes,Ua: NEGATIVE
Nitrite: NEGATIVE
Protein, ur: NEGATIVE mg/dL
Specific Gravity, Urine: 1.005 (ref 1.005–1.030)
pH: 6 (ref 5.0–8.0)

## 2022-09-17 LAB — PROTIME-INR
INR: 0.9 (ref 0.8–1.2)
Prothrombin Time: 12.8 seconds (ref 11.4–15.2)

## 2022-09-17 LAB — CBG MONITORING, ED: Glucose-Capillary: 101 mg/dL — ABNORMAL HIGH (ref 70–99)

## 2022-09-17 MED ORDER — SENNOSIDES-DOCUSATE SODIUM 8.6-50 MG PO TABS: 1.0000 | ORAL_TABLET | Freq: Every evening | ORAL | Status: DC | PRN

## 2022-09-17 MED ORDER — STROKE: EARLY STAGES OF RECOVERY BOOK: Freq: Once | Status: AC

## 2022-09-17 MED ORDER — IOHEXOL 350 MG/ML SOLN
75.0000 mL | Freq: Once | INTRAVENOUS | Status: AC | PRN
  Administered 2022-09-17: 75 mL via INTRAVENOUS

## 2022-09-17 MED ORDER — ACETAMINOPHEN 650 MG RE SUPP: 650.0000 mg | RECTAL | Status: DC | PRN

## 2022-09-17 MED ORDER — ASPIRIN 325 MG PO TBEC
325.0000 mg | DELAYED_RELEASE_TABLET | Freq: Once | ORAL | Status: AC
  Administered 2022-09-17: 325 mg via ORAL
  Filled 2022-09-17: qty 1

## 2022-09-17 MED ORDER — ASPIRIN 81 MG PO CHEW
81.0000 mg | CHEWABLE_TABLET | Freq: Every day | ORAL | Status: DC
  Administered 2022-09-18: 81 mg via ORAL
  Filled 2022-09-17: qty 1

## 2022-09-17 MED ORDER — HEPARIN SODIUM (PORCINE) 5000 UNIT/ML IJ SOLN
5000.0000 [IU] | Freq: Three times a day (TID) | INTRAMUSCULAR | Status: DC
  Administered 2022-09-18 – 2022-09-19 (×4): 5000 [IU] via SUBCUTANEOUS
  Filled 2022-09-17 (×4): qty 1

## 2022-09-17 MED ORDER — CLOPIDOGREL BISULFATE 75 MG PO TABS
75.0000 mg | ORAL_TABLET | Freq: Every day | ORAL | Status: DC
  Administered 2022-09-18 – 2022-09-19 (×2): 75 mg via ORAL
  Filled 2022-09-17 (×2): qty 1

## 2022-09-17 MED ORDER — ACETAMINOPHEN 325 MG PO TABS: 650.0000 mg | ORAL_TABLET | ORAL | Status: DC | PRN

## 2022-09-17 MED ORDER — SODIUM CHLORIDE 0.9 % IV SOLN: INTRAVENOUS | Status: DC

## 2022-09-17 MED ORDER — ACETAMINOPHEN 160 MG/5ML PO SOLN: 650.0000 mg | ORAL | Status: DC | PRN

## 2022-09-17 NOTE — ED Notes (Signed)
Pt got up and ambulated with assistance to the BR without any issue. She states that she normally uses a walker and had hers here, but it's not in the room. Family took it prior to coming. Will get walker for pt to use from hospital. She tolerated a sandwich with no issues of swallowing

## 2022-09-17 NOTE — ED Notes (Addendum)
Have taken 2 EKG's so far but having trouble getting portable machine to receive pt's info so will try to take another again soon. This RN witness to placing pt stickers on previous EKG's in person while in triage room.

## 2022-09-17 NOTE — ED Notes (Signed)
EKG to EDP Kinner in person.  °

## 2022-09-17 NOTE — ED Provider Notes (Signed)
[facility] Provider Note    Event Date/Time   First MD Initiated Contact with Patient 09/17/22 1129     (approximate)   History   Cerebrovascular Accident   HPI  Maria [name] is a 89 y.o. female who presents to the emergency department today accompanied by family because of concerns for possible CVA.  They state over the past couple of days they have noticed some increased confusion and forgetfulness which is unusual for the patient.  When they had nursing staff take a look at her today they noticed some left-sided facial droop.  Patient does not have history of CVAs.  Does have histories of urinary tract infections although has not noticed any recent dysuria or bad odor.     Physical Exam   Triage Vital Signs: ED Triage Vitals  Enc Vitals Group     BP 09/17/22 1025 (!) 157/73     Pulse Rate 09/17/22 1025 70     Resp 09/17/22 1025 17     Temp 09/17/22 1025 97.9 F (36.6 C)     Temp src --      SpO2 09/17/22 1025 100 %     Weight 09/17/22 1029 140 lb (63.5 kg)     Height 09/17/22 1029 5\' 3"  (1.6 m)     Head Circumference --      Peak Flow --      Pain Score 09/17/22 1028 0     Pain Loc --      Pain Edu? --      Excl. in GC? --     Most recent vital signs: Vitals:   09/17/22 1025  BP: (!) 157/73  Pulse: 70  Resp: 17  Temp: 97.9 F (36.6 C)  SpO2: 100%   General: Awake, alert, oriented. CV:  Good peripheral perfusion. Regular rate and rhythm. Resp:  Normal effort. Lungs clear. Abd:  No distention.  Neuro:  Left facial droop. Strength 5/5 in upper and lower extremities. Sensation grossly intact.    ED Results / Procedures / Treatments   Labs (all labs ordered are listed, but only abnormal results are displayed) Labs Reviewed  COMPREHENSIVE METABOLIC PANEL - Abnormal; Notable for the following components:      Result Value   Glucose, Bld 100 (*)    All other components within normal limits  CBG MONITORING, ED - Abnormal; Notable  for the following components:   Glucose-Capillary 101 (*)    All other components within normal limits  PROTIME-INR  APTT  CBC  DIFFERENTIAL     EKG  I, Phineas Semen, attending physician, personally viewed and interpreted this EKG  EKG Time: 1050 Rate: 65 Rhythm: normal sinus rhythm Axis: left axis deviation Intervals: qtc 488 QRS: LBBB ST changes: no st elevation Impression: abnormal ekg   RADIOLOGY I independently interpreted and visualized the CT head. My interpretation: No bleed Radiology interpretation:  IMPRESSION:  1.  No evidence of an acute intracranial abnormality.  2. Moderate chronic small vessel ischemic changes within the  cerebral white matter.  3. Mild generalized cerebral atrophy.  4. Paranasal sinus disease as described.      PROCEDURES:  Critical Care performed: No    MEDICATIONS ORDERED IN ED: Medications - No data to display   IMPRESSION / MDM / ASSESSMENT AND PLAN / ED COURSE  I reviewed the triage vital signs and the nursing notes.  Differential diagnosis includes, but is not limited to, CVA, anemia, electrolyte abnormality  Patient's presentation is most consistent with acute presentation with potential threat to life or bodily function.   The patient is on the cardiac monitor to evaluate for evidence of arrhythmia and/or significant heart rate changes.  Patient presented to the emergency department today because of concerns for confusion and left facial droop.  On exam patient does have slight left facial droop.  No other focal neurodeficits found.  CT scan without any obvious acute findings.  Will obtain MRI to evaluate for possible CVA.      FINAL CLINICAL IMPRESSION(S) / ED DIAGNOSES   Final diagnoses:  Facial droop  Confusion     Note:  This document was prepared using Dragon voice recognition software and may include unintentional dictation errors.    Phineas Semen, MD 09/17/22  1520

## 2022-09-17 NOTE — ED Notes (Signed)
Pt comes via EMS from home with c/o left sided facial droop. Pt family reports this started Friday. Family states it has now gotten worse. Pt denies any weakness,blurry vision  Pt is from [facility] [place]. BP-176/84

## 2022-09-17 NOTE — ED Notes (Signed)
3rd EKG completed since portable machine wouldn't receive pt's information initially for it to be linked to pt and exported to chart.

## 2022-09-17 NOTE — ED Notes (Signed)
Patient ambulated to bathroom with one person assist

## 2022-09-17 NOTE — ED Notes (Signed)
Pt stated that black walker with stained glass bird on it containing her purse and phone is missing. Will be on the lookout for it. Nurse notified via phone call.

## 2022-09-17 NOTE — ED Triage Notes (Addendum)
Friday afternoon family noted confusion, "dullness", pt didn't take her meds that morning, not eaten well at that time; pt not her talkative self per staff at facility per family; denies obvious changes to ambulation with her walker; speech clear per family just less verbally interactive; shorter memory span recently; L-sided facial droop/weakness. Family thinks pt is on blood thinner.

## 2022-09-17 NOTE — ED Notes (Signed)
Pt was found wandering the hallway.  Pt was guided back to her bed, walker from hospital found, bed alarm and fall risk precautions placed on pt. Re-enforcement of using the call bell was used to pt, all belongings brought close to the pt and door left open, night light on. Will cont to monitor

## 2022-09-17 NOTE — H&P (Addendum)
History and Physical    Maria [name] UVO:[id number] DOB: 03-Mar-1931 DOA: 09/17/2022  PCP: Lauro Regulus, MD  Patient coming from: [facility]  I have personally briefly reviewed patient's old medical records in [facility]  Chief Complaint: change in mental status left side facial droop slurred speech  HPI: Maria [name] is a very pleasant 89 y.o. female with medical history significant of HLD, HTN, LBBB, CHF last ef 40% per cards related to LBBB,recurrent UTI,who presents to ED BIB EMS with complaint of left sided facial droop, slurred speech and confusion. Per family patient was noted have increase confusion and lethargy starting 2days ago. As patient did not improve with time, family took patient to  RN at her independent living facility for evaluation.  Patient and at time patient was noted to have left sided facial droop and slurred speech. EMS was then called for transport to ED.   ED Course:  Afeb bp 157/73, hr 70, rr 17, sat 100%  Wbc: 6.6, hgb 13.9, plt 185  Inr 0.9,  Na 135, K 4, cl 102, glu 100, cr 0.6  UA:neg  EKG: NSR, LAD, LBBB CTH: NAD MRI IMPRESSION: 1. Acute right thalamocapsular infarct. 2. Moderate chronic small vessel ischemic disease.   Review of Systems: As per HPI otherwise 10 point review of systems negative.   Past Medical History:  Diagnosis Date   Arthritis    fingers, knees   Cancer (HCC)    melanoma   CHF (congestive heart failure) (HCC)    Complication of anesthesia    Pt reports "coded" during spinal injection during back surgery - 8 yrs ago - [place], [place]   Diverticulitis    Environmental and seasonal allergies    Hypercholesteremia    Hypertension    Left arm weakness    positional   Left bundle branch block (LBBB)    Motion sickness    seasick   Seizures (HCC) 06/2013   reaction to exposure to allergan (dogs and cats)   Vasovagal episode     Past Surgical History:  Procedure Laterality Date   BACK SURGERY      multiple   CATARACT EXTRACTION W/ INTRAOCULAR LENS  IMPLANT, BILATERAL     COLONOSCOPY WITH PROPOFOL N/A 06/27/2016   Procedure: COLONOSCOPY WITH PROPOFOL;  Surgeon: Scot Jun, MD;  Location: [facility] ENDOSCOPY;  Service: Endoscopy;  Laterality: N/A;   HAMMERTOE RECONSTRUCTION WITH WEIL OSTEOTOMY Left 08/29/2006   JOINT REPLACEMENT  08/27/2014   knee   KNEE ARTHROCENTESIS Left 04/26/2008   LUMBAR LAMINECTOMY N/A 06/25/2015   MASS EXCISION Left 12/15/2014   Procedure: MINOR EXCISION OF MASS LEFT INCLUSION CYST;  Surgeon: Linus Salmons, MD;  Location: [facility];  Service: ENT;  Laterality: Left;  NEEDS 2 NURSE   MELANOMA EXCISION Right 03/14/2016   ROTATOR CUFF REPAIR Bilateral    toric lens Left 07/15/12 and 08/05/12   TOTAL HIP ARTHROPLASTY Right 01/24/2018   Procedure: TOTAL HIP ARTHROPLASTY ANTERIOR APPROACH;  Surgeon: Kennedy Bucker, MD;  Location: [facility] ORS;  Service: Orthopedics;  Laterality: Right;     reports that she has never smoked. She has never used smokeless tobacco. She reports that she does not drink alcohol and does not use drugs.  Allergies  Allergen Reactions   Dog Epithelium (Canis Lupus Familiaris) Shortness Of Breath    Seizure   Penicillin G Hives    AGE 56 yo PCN allergy   Sulfa Antibiotics Other (See Comments) and Anaphylaxis   Sulfamethoxazole-Trimethoprim  Itching, Palpitations, Other (See Comments), Rash and Swelling    Shaking, redness, sneezing   Uncaria Tomentosa (Cats Claw) Anaphylaxis    Seizure   Guaifenesin Other (See Comments)    Insomnia and anxiety   Levofloxacin Other (See Comments)    750mg  dosage after 24 hours caused confusion, off-balanced CNS   Metronidazole Nausea Only    Abdominal cramping  Gi upset   Molds & Smuts Cough   Articaine-Epinephrine Other (See Comments) and Swelling    At site, nerve loss for few days, used by dentist   Erythromycin Nausea Only and Rash   Hydralazine Hcl Other (See Comments)    hyponatremia    Nitrofurantoin Rash and Nausea Only   Other Other (See Comments)    septocaine caused numbness    Propoxyphene Other (See Comments)    Altered mental status   Spinach Palpitations    Irregular heartbeat   Statins Other (See Comments)    Unknown reaction   Tape Rash    Prefers paper tape   Tetracycline Other (See Comments), Nausea Only and Rash    Family History  Problem Relation Age of Onset   Kidney Stones Mother    Ovarian cancer Mother    Heart disease Mother    Heart disease Father    Alzheimer's disease Sister    Heart disease Maternal Grandmother    Heart disease Maternal Grandfather    Heart disease Paternal Grandmother    Kidney disease Neg Hx     Prior to Admission medications   Medication Sig Start Date End Date Taking? Authorizing Provider  acetaminophen (TYLENOL) 500 MG tablet Take 500-1,000 mg by mouth every 6 (six) hours as needed for moderate pain.    [provider]  amLODipine (NORVASC) 5 MG tablet Take 5 mg by mouth at bedtime.    [provider]  brimonidine-timolol (COMBIGAN) 0.2-0.5 % ophthalmic solution Place 1 drop into both eyes every 12 (twelve) hours. 02/18/18   Sharee Holster, NP  carboxymethylcellulose (REFRESH PLUS) 0.5 % SOLN Place 1 drop into both eyes daily as needed (dry eyes).    [provider]  Cholecalciferol (VITAMIN D) 50 MCG (2000 UT) tablet Take 2,000 Units by mouth daily.    [provider]  CRANBERRY PO Take 1,500 mg by mouth daily.    [provider]  latanoprost (XALATAN) 0.005 % ophthalmic solution Place 1 drop into both eyes at bedtime. 02/18/18   Sharee Holster, NP  Magnesium 500 MG TABS Take 500 mg by mouth daily.    [provider]  ramipril (ALTACE) 10 MG capsule Take 10 mg by mouth daily.    [provider]  zinc gluconate 50 MG tablet Take 50 mg by mouth daily.    [provider]    Physical Exam: Vitals:   09/17/22 1025 09/17/22 1029 09/17/22  1440 09/17/22 1515  BP: (!) 157/73  (!) 189/87   Pulse: 70  72   Resp: 17  (!) 25   Temp: 97.9 F (36.6 C)   98.1 F (36.7 C)  TempSrc:    Oral  SpO2: 100%  100%   Weight:  63.5 kg    Height:  5\' 3"  (1.6 m)      Constitutional: NAD, calm, comfortable Vitals:   09/17/22 1025 09/17/22 1029 09/17/22 1440 09/17/22 1515  BP: (!) 157/73  (!) 189/87   Pulse: 70  72   Resp: 17  (!) 25   Temp: 97.9 F (36.6 C)  98.1 F (36.7 C)  TempSrc:    Oral  SpO2: 100%  100%   Weight:  63.5 kg    Height:  5\' 3"  (1.6 m)     Eyes: PERRL, lids and conjunctivae normal ENMT: Mucous membranes are moist. Posterior pharynx clear of any exudate or lesions.Normal dentition.  Neck: normal, supple, no masses, no thyromegaly Respiratory: clear to auscultation bilaterally, no wheezing, no crackles. Normal respiratory effort. No accessory muscle use.  Cardiovascular: Regular rate and rhythm, no murmurs / rubs / gallops. No extremity edema. 2+ pedal pulses.  Abdomen: no tenderness, no masses palpated. No hepatosplenomegaly. Bowel sounds positive.  Musculoskeletal: no clubbing / cyanosis. No joint deformity upper and lower extremities. Good ROM, no contractures. Normal muscle tone.  Skin: no rashes, lesions, ulcers. No induration Neurologic: CN 2-12 grossly intact other than noted mild nasolabial fold flattening on left with mild facial droop. Sensation intact,  Strength 5/5 in all 4.  Psychiatric: Normal judgment and insight. Alert and oriented x 3. Normal mood.    Labs on Admission: I have personally reviewed following labs and imaging studies  CBC: Recent Labs  Lab 09/17/22 1044  WBC 6.6  NEUTROABS 4.3  HGB 13.9  HCT 43.3  MCV 92.3  PLT 185   Basic Metabolic Panel: Recent Labs  Lab 09/17/22 1044  NA 135  K 4.0  CL 102  CO2 25  GLUCOSE 100*  BUN 16  CREATININE 0.60  CALCIUM 9.5   GFR: Estimated Creatinine Clearance: 41.9 mL/min (by C-G formula based on SCr of 0.6 mg/dL). Liver  Function Tests: Recent Labs  Lab 09/17/22 1044  AST 22  ALT 17  ALKPHOS 69  BILITOT 0.7  PROT 7.5  ALBUMIN 4.1   No results for input(s): "LIPASE", "AMYLASE" in the last 168 hours. No results for input(s): "AMMONIA" in the last 168 hours. Coagulation Profile: Recent Labs  Lab 09/17/22 1044  INR 0.9   Cardiac Enzymes: No results for input(s): "CKTOTAL", "CKMB", "CKMBINDEX", "TROPONINI" in the last 168 hours. BNP (last 3 results) No results for input(s): "PROBNP" in the last 8760 hours. HbA1C: No results for input(s): "HGBA1C" in the last 72 hours. CBG: Recent Labs  Lab 09/17/22 1031  GLUCAP 101*   Lipid Profile: No results for input(s): "CHOL", "HDL", "LDLCALC", "TRIG", "CHOLHDL", "LDLDIRECT" in the last 72 hours. Thyroid Function Tests: No results for input(s): "TSH", "T4TOTAL", "FREET4", "T3FREE", "THYROIDAB" in the last 72 hours. Anemia Panel: No results for input(s): "VITAMINB12", "FOLATE", "FERRITIN", "TIBC", "IRON", "RETICCTPCT" in the last 72 hours. Urine analysis:    Component Value Date/Time   COLORURINE STRAW (A) 09/17/2022 1044   APPEARANCEUR CLEAR (A) 09/17/2022 1044   APPEARANCEUR Cloudy (A) 01/11/2016 0953   LABSPEC 1.005 09/17/2022 1044   PHURINE 6.0 09/17/2022 1044   GLUCOSEU NEGATIVE 09/17/2022 1044   HGBUR NEGATIVE 09/17/2022 1044   BILIRUBINUR NEGATIVE 09/17/2022 1044   BILIRUBINUR Negative 01/11/2016 0953   KETONESUR NEGATIVE 09/17/2022 1044   PROTEINUR NEGATIVE 09/17/2022 1044   NITRITE NEGATIVE 09/17/2022 1044   LEUKOCYTESUR NEGATIVE 09/17/2022 1044    Radiological Exams on Admission: MR BRAIN WO CONTRAST  Result Date: 09/17/2022 CLINICAL DATA:  Left facial droop.  Confusion. EXAM: MRI HEAD WITHOUT CONTRAST TECHNIQUE: Multiplanar, multiecho pulse sequences of the brain and surrounding structures were obtained without intravenous contrast. COMPARISON:  Head CT 09/17/2022 FINDINGS: Brain: There is a 1.5 cm acute infarct involving the  ventral right thalamus and adjacent internal capsule. No intracranial hemorrhage, mass, midline shift, or extra-axial  fluid collection is identified. Patchy T2 hyperintensities in the cerebral white matter and pons are nonspecific but compatible with moderate chronic small vessel ischemic disease. There is mild cerebral atrophy. Vascular: Major intracranial vascular flow voids are preserved. Skull and upper cervical spine: Unremarkable bone marrow signal. Sinuses/Orbits: Bilateral cataract extraction. Mild mucosal thickening in the paranasal sinuses. Trace bilateral mastoid fluid. Other: None. IMPRESSION: 1. Acute right thalamocapsular infarct. 2. Moderate chronic small vessel ischemic disease. Electronically Signed   By: Sebastian Ache M.D.   On: 09/17/2022 16:11   CT HEAD WO CONTRAST  Result Date: 09/17/2022 CLINICAL DATA:  Provided history: Mental status change, unknown cause. Memory loss. EXAM: CT HEAD WITHOUT CONTRAST TECHNIQUE: Contiguous axial images were obtained from the base of the skull through the vertex without intravenous contrast. RADIATION DOSE REDUCTION: This exam was performed according to the departmental dose-optimization program which includes automated exposure control, adjustment of the mA and/or kV according to patient size and/or use of iterative reconstruction technique. COMPARISON:  Prior head CT examinations 07/13/2019 and earlier. FINDINGS: Brain: Mild generalized cerebral atrophy. Patchy and ill-defined hypoattenuation within the cerebral white matter, nonspecific but compatible with moderate chronic small vessel ischemic disease. There is no acute intracranial hemorrhage. No demarcated cortical infarct. No extra-axial fluid collection. No evidence of an intracranial mass. No midline shift. Vascular: No hyperdense vessel.  Atherosclerotic calcifications. Skull: No fracture or aggressive osseous lesion. Sinuses/Orbits: No mass or acute finding within the imaged orbits. Postsurgical  appearance of the paranasal sinuses. Minimal mucosal thickening, and tiny mucous retention cyst or polyp, within the right maxillary sinus. Mild mucosal thickening within the bilateral ethmoid sinuses/ethmoidectomy cavities. Mucosal thickening within the inferior left frontal sinus. IMPRESSION: 1.  No evidence of an acute intracranial abnormality. 2. Moderate chronic small vessel ischemic changes within the cerebral white matter. 3. Mild generalized cerebral atrophy. 4. Paranasal sinus disease as described. Electronically Signed   By: Jackey Loge D.O.   On: 09/17/2022 11:14    EKG: Independently reviewed.   Assessment/Plan  Acute right thalamocapsular infarct  -CTA pending --symptoms of mild left sided facial droop persistent  - case discussed with neurology  -admit tia/cva r/o protocol -MRI brain pending  -A1c/HLD pending -Permissive HTN x48 hours  -goal BP < 220/110, PRN above these parameters -  ASA 81mg  daily + plavix 75mg   ordered -duration as per neurology  -neuro checks , SLP, PT/OT /ECHO -await final neuro recs      CHFref in setting of LBBB -ef 40%   -currently compensated  -not on GDMT  -follows with Dr Ples Specter -repeat echo pending   HLD -diet controlled  - has allergy to statin     HTN -hold oral medication for permissive htn   Prediabetes -check A1c  - monitor fs   DVT prophylaxis: heparin Code Status: full/ as discussed per patient wishes in event of cardiac arrest  Family Communication: Sabel, [place] (Daughter) [phone] (Home Phone)  Disposition Plan: patient  expected to be admitted greater than 2 midnights  Consults called: Neuro Dr Wilford Corner will see in am  Admission status:  progressive   Lurline Del MD [facility]   If 7PM-7AM, please contact night-coverage [email or web address] Password [facility]  09/17/2022, 4:39 PM

## 2022-09-18 ENCOUNTER — Inpatient Hospital Stay (HOSPITAL_COMMUNITY)
Admit: 2022-09-18 | Discharge: 2022-09-18 | Disposition: A | Discharge status: Home / Self Care | Payer: Medicare Other | Attending: Internal Medicine | Admitting: Internal Medicine

## 2022-09-18 DIAGNOSIS — I6389 Other cerebral infarction: Secondary | ICD-10-CM

## 2022-09-18 DIAGNOSIS — I639 Cerebral infarction, unspecified: Secondary | ICD-10-CM | POA: Diagnosis not present

## 2022-09-18 LAB — HEMOGLOBIN A1C
Hgb A1c MFr Bld: 5.5 % (ref 4.8–5.6)
Mean Plasma Glucose: 111.15 mg/dL

## 2022-09-18 LAB — LIPID PANEL
Cholesterol: 236 mg/dL — ABNORMAL HIGH (ref 0–200)
HDL: 64 mg/dL (ref >40)
LDL Cholesterol: 154 mg/dL — ABNORMAL HIGH (ref 0–99)
Total CHOL/HDL Ratio: 3.7 RATIO
Triglycerides: 92 mg/dL (ref <150)
VLDL: 18 mg/dL (ref 0–40)

## 2022-09-18 MED ORDER — ASPIRIN 325 MG PO TABS
325.0000 mg | ORAL_TABLET | Freq: Every day | ORAL | Status: DC
  Administered 2022-09-19: 325 mg via ORAL
  Filled 2022-09-18: qty 1

## 2022-09-18 MED ORDER — LATANOPROST 0.005 % OP SOLN
1.0000 [drp] | Freq: Every day | OPHTHALMIC | Status: DC
  Administered 2022-09-18: 1 [drp] via OPHTHALMIC
  Filled 2022-09-18: qty 2.5

## 2022-09-18 MED ORDER — EZETIMIBE 10 MG PO TABS
10.0000 mg | ORAL_TABLET | Freq: Every day | ORAL | Status: DC
  Administered 2022-09-18 – 2022-09-19 (×2): 10 mg via ORAL
  Filled 2022-09-18 (×2): qty 1

## 2022-09-18 NOTE — Evaluation (Addendum)
Speech Language Pathology Evaluation Patient Details Name: Maria [name] MRN: [id number] DOB: [birth year] Today's Date: 09/18/2022 Time: 4098-1191 SLP Time Calculation (min) (ACUTE ONLY): 50 min  Problem List:  Patient Active Problem List   Diagnosis Date Noted   Acute CVA (cerebrovascular accident) (HCC) 09/17/2022   Chronic constipation 02/01/2018   Chronic gout of multiple sites 02/01/2018   Glaucoma, bilateral 02/01/2018   Subcapital fracture of femur, right, closed, with routine healing, subsequent encounter 02/01/2018   Hip fracture (HCC) 01/24/2018   Colonic diverticular abscess 04/02/2016   AKI (acute kidney injury) (HCC) 12/10/2015   Diverticulitis of large intestine without perforation or abscess without bleeding 10/17/2015   Chronic systolic CHF (congestive heart failure), NYHA class 2 (HCC) 03/24/2015   Benign essential hypertension 03/16/2015   Hyperlipidemia, mixed 03/16/2015   Bundle branch block, left 09/01/2014   Status post total right knee replacement 08/27/2014   Diverticulosis of large intestine without hemorrhage 06/08/2014   Allergic rhinitis due to animal hair and dander 04/30/2014   Past Medical History:  Past Medical History:  Diagnosis Date   Arthritis    fingers, knees   Cancer (HCC)    melanoma   CHF (congestive heart failure) (HCC)    Complication of anesthesia    Pt reports "coded" during spinal injection during back surgery - 8 yrs ago - [place], [place]   Diverticulitis    Environmental and seasonal allergies    Hypercholesteremia    Hypertension    Left arm weakness    positional   Left bundle branch block (LBBB)    Motion sickness    seasick   Seizures (HCC) 06/2013   reaction to exposure to allergan (dogs and cats)   Vasovagal episode    Past Surgical History:  Past Surgical History:  Procedure Laterality Date   BACK SURGERY     multiple   CATARACT EXTRACTION W/ INTRAOCULAR LENS  IMPLANT, BILATERAL     COLONOSCOPY WITH PROPOFOL N/A  06/27/2016   Procedure: COLONOSCOPY WITH PROPOFOL;  Surgeon: Scot Jun, MD;  Location: [facility] ENDOSCOPY;  Service: Endoscopy;  Laterality: N/A;   HAMMERTOE RECONSTRUCTION WITH WEIL OSTEOTOMY Left 08/29/2006   JOINT REPLACEMENT  08/27/2014   knee   KNEE ARTHROCENTESIS Left 04/26/2008   LUMBAR LAMINECTOMY N/A 06/25/2015   MASS EXCISION Left 12/15/2014   Procedure: MINOR EXCISION OF MASS LEFT INCLUSION CYST;  Surgeon: Linus Salmons, MD;  Location: [facility];  Service: ENT;  Laterality: Left;  NEEDS 2 NURSE   MELANOMA EXCISION Right 03/14/2016   ROTATOR CUFF REPAIR Bilateral    toric lens Left 07/15/12 and 08/05/12   TOTAL HIP ARTHROPLASTY Right 01/24/2018   Procedure: TOTAL HIP ARTHROPLASTY ANTERIOR APPROACH;  Surgeon: Kennedy Bucker, MD;  Location: [facility] ORS;  Service: Orthopedics;  Laterality: Right;   HPI:  Per MD note, pt is a very pleasant 89 y.o. female with medical history significant of living alone in an apartment of her living community - pt stated she requests her meals be brought to her at her apartment, HLD, HTN, LBBB, CHF last ef 40% per cards related to LBBB,recurrent UTI,who presents to ED   EMS was called with complaint of left sided facial droop, slurred speech and confusion. Per family, patient was noted have increase confusion and lethargy starting 2days ago. As patient did not improve with time, family took patient to the RN at her independent living facility for evaluation who then called EMS.    MRI: Acute right thalamocapsular infarct.  2. Moderate  chronic small vessel ischemic disease.   Assessment / Plan / Recommendation Clinical Impression  Pt seen today for informal screening of cognitive-linguistic and speech-language abilities at bedside using parts of the SLUMS assessment tool and the Western Aphasic Battery(WAB). Pt awake, verbal and engaged easily w/ this therapist. NSG has noted the same communication abilities this morning. Pt stated she feels she is at  her Baseline re: her verbal communication today. 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 She engaged easily in conversation re: Music, her breakfast meal, and her Daughter who is an SLP and lives in [place], [place].  Pt appears to present w/ slight-min cognitive-communication deficits impacting her verbal communication skills overall c/b deficits in the areas of attention, memory recall, and executive functioning per demonstration on subtests on the SLUMS. During language tasks, pt was able to identify ADL objects/their function w/ basic word/description but needed verbal cues to give more details. Min-mod complex Y/N questions and Repetition tasks were accurate. Following 1-2 step commands was accurate. Verbal picture description was functional w/ basic details; further detailed information was retrieved from pt per verbal cueing. Pt was able to ID problem situations and provide a solution. Slight, inconsistent phonemic paraphasias(self-corrected) and hesiations noted during conversation and challenged verbal tasks in potential setting of possible slower processing time. Time and verbal cues given when needed during tasks for pt to work through the task which increased accuracy and detail of answers.  OM exam revealed slight Left corner of mouth decreased tone; all other lingual/labial strength/ROM was [facility].   Overall cognitive-linguistic and language deficits may impact ability to independently manage finances, medications, and maintain safety in the home environment. Unsure of pt's Baseline level of functioning as she lives alone in her apartment. With MRI results revealing acute right thalamocapsular infarct and Moderate Chronic small vessel ischemic disease, suspect potential cognitive-linguistic impact could be present at Baseline and need for support in the home and during some ADLs could be necessary. Suspect pt could be close to/at her previous Baseline, but f/u for any new  cognitive-linguistic intervention/needs could be beneficial w/ executive functioning tasks during ADLs in the home environment. Would recommend Supervision in the Home post D/C for safety. Recommend f/u w/ Neurology for formal assessment of pt's cognitive-communication function/status.    SLP Assessment  SLP Recommendation/Assessment: All further Speech Lanaguage Pathology  needs can be addressed in the next venue of care (if need is determined there) SLP Visit Diagnosis: Cognitive communication deficit (R41.841)    Recommendations for follow up therapy are one component of a multi-disciplinary discharge planning process, led by the attending physician.  Recommendations may be updated based on patient status, additional functional criteria and insurance authorization.    Follow Up Recommendations  Follow physician's recommendations for discharge plan and follow up therapies    Assistance Recommended at Discharge  Set up Supervision/Assistance (moreso w/ higher executive tasks if need determined)  Functional Status Assessment  unknown  Frequency and Duration  (n/a)   (n/a)      SLP Evaluation Cognition  Overall Cognitive Status: No family/caregiver present to determine baseline cognitive functioning Arousal/Alertness: Awake/alert Orientation Level: Oriented to person;Oriented to place;Oriented to time;Oriented to situation (knew she was in the hospital b/c of "not feeling well" but unsure of details. NOT oriented to her apartment #([phone])) Year: 2024 Month: June Attention: Focused;Sustained Focused Attention: Appears intact Sustained Attention: Appears intact Memory: Impaired Memory Impairment: Decreased recall of new information (5 words -- "I guess I don't remember that  well") Awareness: Appears intact Problem Solving: Appears intact Executive Function: Reasoning;Sequencing;Decision Making Reasoning: Appears intact Sequencing: Appears intact Decision Making: Appears  intact Behaviors:  (none) Safety/Judgment: Appears intact Comments: pictured situation - correctly ID'd the problems and solutions       Comprehension  Auditory Comprehension Overall Auditory Comprehension: Appears within functional limits for tasks assessed Yes/No Questions: Within Functional Limits Commands: Within Functional Limits (1-2 step) Conversation: Simple Other Conversation Comments: slight hesitations and phonemic paraphasias which she self-corrected Interfering Components: Processing speed (?) EffectiveTechniques: Extra processing time Visual Recognition/Discrimination Discrimination: Not tested Reading Comprehension Reading Status: Not tested (did not have reading glasses)    Expression Expression Primary Mode of Expression: Verbal Verbal Expression Overall Verbal Expression: Appears within functional limits for tasks assessed (slight, inconsistent phonemic paraphasias(self-corrected) and hesiations during conversation and challenged verbal tasks) Initiation: No impairment Automatic Speech:  (wfl) Level of Generative/Spontaneous Verbalization: Sentence Repetition: No impairment Naming: No impairment Pragmatics: No impairment (brief, short sentences during conversation) Interfering Components:  (n/a) Effective Techniques:  (n/a) Non-Verbal Means of Communication: Not applicable Written Expression Dominant Hand: Right Written Expression: Not tested   Oral / Motor  Oral Motor/Sensory Function Overall Oral Motor/Sensory Function: Within functional limits (slight decreased Left labial tone in corner of mouth - full ROM) Motor Speech Overall Motor Speech: Appears within functional limits for tasks assessed Respiration: Within functional limits Phonation: Normal Resonance: Within functional limits Articulation: Within functional limitis Intelligibility: Intelligible (100%) Motor Planning: Witnin functional limits Motor Speech Errors: Not applicable               Jerilynn Som, MS, CCC-SLP Speech Language Pathologist Rehab Services; [facility] [phone] (ascom) Shaquala Broeker 09/18/2022, 12:13 PM

## 2022-09-18 NOTE — Evaluation (Addendum)
Physical Therapy Evaluation Patient Details Name: Maria [name] MRN: [id number] DOB: Aug 08, 1930 Today's Date: 09/18/2022  History of Present Illness  Patient is a 89 year old with change in mental status left side facial droop slurred speech. 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  Clinical Impression  PT evaluation completed. Patient is cooperative throughout session. She reports she lives with her spouse (the chart indicated patient is widowed). She is able to follow single step commands without difficulty but has some confusion about recent events. The chart also indicated patient is coming from [facility] [place] where she reports living in an apartment without steps.  The patient reports she typically ambulates without a rolling walker. With standing, patient feels mild unsteadiness and feels more comfortable with rolling walker support. She walked in the the hallway with min guard assistance without loss of balance. No focal weakness was noted in extremities. Anticipate the patient might require intermittent supervision for cognition at discharge. PT will continue to follow to maximize independence and facilitate return to prior level of function.      Recommendations for follow up therapy are one component of a multi-disciplinary discharge planning process, led by the attending physician.  Recommendations may be updated based on patient status, additional functional criteria and insurance authorization.  Follow Up Recommendations       Assistance Recommended at Discharge Intermittent Supervision/Assistance  Patient can return home with the following  A little help with walking and/or transfers;A little help with bathing/dressing/bathroom    Equipment Recommendations None recommended by PT  Recommendations for Other Services       Functional Status Assessment Patient has had a recent decline in their functional status and demonstrates the ability to make significant  improvements in function in a reasonable and predictable amount of time.     Precautions / Restrictions Precautions Precautions: Fall Restrictions Weight Bearing Restrictions: No      Mobility  Bed Mobility Overal bed mobility: Modified Independent                  Transfers Overall transfer level: Needs assistance Equipment used: Rolling walker (2 wheels) Transfers: Sit to/from Stand Sit to Stand: Min guard, Supervision           General transfer comment: increased time required for standing with no physical lifting assistance required. Min gaurd for the first stand and supervision for second stand    Ambulation/Gait Ambulation/Gait assistance: Min guard Gait Distance (Feet): 130 Feet Assistive device: Rolling walker (2 wheels) Gait Pattern/deviations: Step-through pattern, Decreased stride length Gait velocity: decreased     General Gait Details: no loss of balance while using rolling walker. patient reports she feels unsteady if not holding on to something while standing. heart rate in the 80's after walking and no dizziness is reported with mobility  Stairs            Wheelchair Mobility    Modified Rankin (Stroke Patients Only)       Balance Overall balance assessment: Needs assistance Sitting-balance support: Feet supported Sitting balance-Leahy Scale: Good     Standing balance support: Single extremity supported Standing balance-Leahy Scale: Fair Standing balance comment: patient is requesting to have something to hold on to for safety/comfort with standing.                             Pertinent Vitals/Pain Pain Assessment Pain Assessment: No/denies pain    Home Living Family/patient expects to  be discharged to:: Other (Comment) ([facility]) Living Arrangements: Alone Available Help at Discharge: Family Type of Home: Apartment Home Access: Level entry       Home Layout: One level   Additional Comments:  patient reports she lives with her spouse, but the chart indicated patient is widowed    Prior Function Prior Level of Function : Independent/Modified Independent             Mobility Comments: patient reports she walks with no device and drives at baseline, no family in room to confirm about the driving . ADLs Comments: patient reports she is independent     Hand Dominance   Dominant Hand: Right    Extremity/Trunk Assessment   Upper Extremity Assessment Upper Extremity Assessment: Overall WFL for tasks assessed    Lower Extremity Assessment Lower Extremity Assessment: Overall WFL for tasks assessed       Communication   Communication: No difficulties  Cognition Arousal/Alertness: Awake/alert Behavior During Therapy: WFL for tasks assessed/performed Overall Cognitive Status: No family/caregiver present to determine baseline cognitive functioning                                 General Comments: Patient is oriented to person. She reports she lives with her spouse, however chart indicated patient is widowed. She is able to follow single step commands without difficulty. she does not recall how she got to the hospital        General Comments      Exercises     Assessment/Plan    PT Assessment Patient needs continued PT services  PT Problem List Decreased activity tolerance;Decreased balance;Decreased mobility;Decreased cognition       PT Treatment Interventions DME instruction;Gait training;Stair training;Functional mobility training;Therapeutic activities;Therapeutic exercise;Balance training;Neuromuscular re-education;Cognitive remediation;Patient/family education    PT Goals (Current goals can be found in the Care Plan section)  Acute Rehab PT Goals Patient Stated Goal: to go home PT Goal Formulation: With patient Time For Goal Achievement: 10/02/22 Potential to Achieve Goals: Good    Frequency Min 2X/week     Co-evaluation                AM-PAC PT "6 Clicks" Mobility  Outcome Measure Help needed turning from your back to your side while in a flat bed without using bedrails?: None Help needed moving from lying on your back to sitting on the side of a flat bed without using bedrails?: A Little Help needed moving to and from a bed to a chair (including a wheelchair)?: A Little Help needed standing up from a chair using your arms (e.g., wheelchair or bedside chair)?: A Little Help needed to walk in hospital room?: A Little Help needed climbing 3-5 steps with a railing? : A Little 6 Click Score: 19    End of Session Equipment Utilized During Treatment: Gait belt Activity Tolerance: Patient tolerated treatment well Patient left: in bed;with call bell/phone within reach;with bed alarm set Nurse Communication: Mobility status PT Visit Diagnosis: Unsteadiness on feet (R26.81);Muscle weakness (generalized) (M62.81)    Time: 3664-4034 PT Time Calculation (min) (ACUTE ONLY): 16 min   Charges:   PT Evaluation $PT Eval Low Complexity: 1 Low         Donna Bernard, PT, MPT   [facility] [employer] 09/18/2022, 10:41 AM

## 2022-09-18 NOTE — Evaluation (Signed)
Occupational Therapy Evaluation Patient Details Name: Maria [name] MRN: [id number] DOB: [birth year] Today's Date: 09/18/2022   History of Present Illness Patient is a 89 year old with change in mental status left side facial droop slurred speech. 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   Clinical Impression   Ms. Sehgal was seen for OT evaluation this date. Prior to hospital admission, pt was residing at the [facility] [place] [facility]. No family present at bedside to confirm PLOF, but pt reports she was generally Independent to MOD I for ADL management. Pt presents to acute OT demonstrating impaired ADL performance and functional mobility 2/2 decreased activity tolerance, decreased cognition, and decreased safety awareness (See OT problem list for additional functional deficits). Pt currently requires SUPERVISION for safety with standing grooming, bed, and functional mobility in her room with a RW. Pt would benefit from skilled OT services to address noted impairments and functional limitations (see below for any additional details) in order to maximize safety and independence while minimizing falls risk and caregiver burden. Anticipate the need for follow up OT services upon acute hospital DC.       Recommendations for follow up therapy are one component of a multi-disciplinary discharge planning process, led by the attending physician.  Recommendations may be updated based on patient status, additional functional criteria and insurance authorization.   Assistance Recommended at Discharge Intermittent Supervision/Assistance  Patient can return home with the following A little help with walking and/or transfers;Assistance with cooking/housework;Assist for transportation;Help with stairs or ramp for entrance;Direct supervision/assist for medications management;A little help with bathing/dressing/bathroom    Functional Status Assessment  Patient has had a recent decline in their  functional status and demonstrates the ability to make significant improvements in function in a reasonable and predictable amount of time.  Equipment Recommendations  None recommended by OT    Recommendations for Other Services       Precautions / Restrictions Precautions Precautions: Fall Restrictions Weight Bearing Restrictions: No      Mobility Bed Mobility Overal bed mobility: Needs Assistance Bed Mobility: Supine to Sit, Sit to Supine     Supine to sit: Supervision, HOB elevated Sit to supine: Supervision, HOB elevated        Transfers Overall transfer level: Needs assistance Equipment used: Rolling walker (2 wheels) Transfers: Sit to/from Stand Sit to Stand: Supervision           General transfer comment: increased time required for standing with no physical lifting assistance required.      Balance Overall balance assessment: Needs assistance Sitting-balance support: Feet supported Sitting balance-Leahy Scale: Good     Standing balance support: Single extremity supported, No upper extremity supported, During functional activity Standing balance-Leahy Scale: Good Standing balance comment: steady static standing at sink without BUE support.                           ADL either performed or assessed with clinical judgement   ADL Overall ADL's : Needs assistance/impaired                                       General ADL Comments: Pt is supervision for bed/functional mobiltiy. Able to perform standing grooming at sink with SET UP assist. Is aware of need to adjust movements 2/2 lines and leads. Overall likely near her baseline level of functional independence.  Vision Patient Visual Report: No change from baseline       Perception     Praxis      Pertinent Vitals/Pain Pain Assessment Pain Assessment: No/denies pain     Hand Dominance Right   Extremity/Trunk Assessment Upper Extremity Assessment Upper  Extremity Assessment: Overall WFL for tasks assessed   Lower Extremity Assessment Lower Extremity Assessment: Overall WFL for tasks assessed       Communication Communication Communication: No difficulties   Cognition Arousal/Alertness: Awake/alert Behavior During Therapy: WFL for tasks assessed/performed Overall Cognitive Status: No family/caregiver present to determine baseline cognitive functioning                                 General Comments: Patient is oriented to person, limited place, but not date or situation. She reports living with a spouse, however, chart indicates she is widowed. She follows single step VCs consistently during session, however, mild confusion noted with higher level tasks such as operating bed controlls/call bell and when given multi-step VCs.     General Comments       Exercises Other Exercises Other Exercises: Pt educated on role of OT in acute setting, safety, falls prevention for home and hospital.   Shoulder Instructions      Home Living Family/patient expects to be discharged to:: Other (Comment) (Independent living at [place] [facility] [place]) Living Arrangements: Alone Available Help at Discharge: Family;Available PRN/intermittently Type of Home: Apartment Home Access: Level entry     Home Layout: One level                   Additional Comments: patient reports she lives with her spouse, but the chart indicated patient is widowed  Lives With: Alone    Prior Functioning/Environment Prior Level of Function : Independent/Modified Independent;History of Falls (last six months)             Mobility Comments: patient reports she walks with no device and drives at baseline, no family in room to confirm about the driving . ADLs Comments: Pt reports she is independent with ADL management. Able to confirm she is living at ILF, no family to confirm level of assisted needed with IADL management. Pt endorses 1 fall in last  six months but is unable to provide further detail.        OT Problem List: Decreased cognition;Decreased activity tolerance;Decreased safety awareness;Decreased knowledge of use of DME or AE;Impaired balance (sitting and/or standing)      OT Treatment/Interventions: Self-care/ADL training;Therapeutic exercise;Therapeutic activities;DME and/or AE instruction;Patient/family education;Balance training;Energy conservation    OT Goals(Current goals can be found in the care plan section) Acute Rehab OT Goals Patient Stated Goal: To go home OT Goal Formulation: With patient Time For Goal Achievement: 10/02/22 Potential to Achieve Goals: Good ADL Goals Pt Will Perform Grooming: standing;with modified independence Pt Will Perform Lower Body Dressing: sit to/from stand;with modified independence;with adaptive equipment Pt Will Transfer to Toilet: ambulating;regular height toilet;bedside commode;with modified independence Pt Will Perform Toileting - Clothing Manipulation and hygiene: sit to/from stand;with modified independence;with adaptive equipment  OT Frequency: Min 1X/week    Co-evaluation              AM-PAC OT "6 Clicks" Daily Activity     Outcome Measure Help from another person eating meals?: None Help from another person taking care of personal grooming?: A Little Help from another person toileting, which includes using toliet, bedpan,  or urinal?: A Little Help from another person bathing (including washing, rinsing, drying)?: A Little Help from another person to put on and taking off regular upper body clothing?: A Little Help from another person to put on and taking off regular lower body clothing?: A Little 6 Click Score: 19   End of Session Equipment Utilized During Treatment: Gait belt;Rolling walker (2 wheels)  Activity Tolerance: Patient tolerated treatment well Patient left: in bed;with call bell/phone within reach;with bed alarm set  OT Visit Diagnosis: Other  abnormalities of gait and mobility (R26.89);Other symptoms and signs involving cognitive function                Time: 0921-0939 OT Time Calculation (min): 18 min Charges:  OT General Charges $OT Visit: 1 Visit OT Evaluation $OT Eval Moderate Complexity: 1 Mod OT Treatments $Self Care/Home Management : 8-22 mins  Rockney Ghee, M.S., OTR/L 09/18/22, 12:17 PM

## 2022-09-18 NOTE — Progress Notes (Signed)
[facility]  - [place] [facility]   PATIENT NAME: Maria [name]    MR#:  [id number]  DATE OF BIRTH:  January 10, 1931  SUBJECTIVE:      VITALS:  Blood pressure 137/62, pulse 65, temperature 97.8 F (36.6 C), resp. rate 18, height 5\' 3"  (1.6 m), weight 62.2 kg, SpO2 96 %.  PHYSICAL EXAMINATION:   GENERAL:  89 y.o.-year-old patient with no acute distress.  LUNGS: Normal breath sounds bilaterally, no wheezing CARDIOVASCULAR: S1, S2 normal. No murmur   ABDOMEN: Soft, nontender, nondistended. Bowel sounds present.  EXTREMITIES: No  edema b/l.    NEUROLOGIC: nonfocal  patient is alert and awake SKIN: No obvious rash, lesion, or ulcer.   LABORATORY PANEL:  CBC Recent Labs  Lab 09/17/22 1044  WBC 6.6  HGB 13.9  HCT 43.3  PLT 185    Chemistries  Recent Labs  Lab 09/17/22 1044  NA 135  K 4.0  CL 102  CO2 25  GLUCOSE 100*  BUN 16  CREATININE 0.60  CALCIUM 9.5  AST 22  ALT 17  ALKPHOS 69  BILITOT 0.7   Cardiac Enzymes No results for input(s): "TROPONINI" in the last 168 hours. RADIOLOGY:  CT ANGIO HEAD NECK W WO CM  Result Date: 09/17/2022 CLINICAL DATA:  Left facial droop. Confusion. Right thalamic infarcts. EXAM: CT ANGIOGRAPHY HEAD AND NECK WITH AND WITHOUT CONTRAST TECHNIQUE: Multidetector CT imaging of the head and neck was performed using the standard protocol during bolus administration of intravenous contrast. Multiplanar CT image reconstructions and MIPs were obtained to evaluate the vascular anatomy. Carotid stenosis measurements (when applicable) are obtained utilizing NASCET criteria, using the distal internal carotid diameter as the denominator. RADIATION DOSE REDUCTION: This exam was performed according to the departmental dose-optimization program which includes automated exposure control, adjustment of the mA and/or kV according to patient size and/or use of iterative reconstruction technique. CONTRAST:  75mL OMNIPAQUE IOHEXOL 350 MG/ML SOLN  COMPARISON:  CT head without contrast 09/17/2022. MR head without contrast 09/17/2022. FINDINGS: CTA NECK FINDINGS Aortic arch: Atherosclerotic calcifications are present in the distal aortic arch in at the origin of the left subclavian artery. No significant stenosis or aneurysm is present. Right carotid system: Right common carotid artery is within normal limits. Atherosclerotic calcifications are present in the proximal right ICA without significant stenosis. Cervical right ICA is otherwise normal. Left carotid system: The left common carotid artery mildly tortuous without focal stenosis. Atherosclerotic calcifications are present the proximal left ICA without a significant stenosis relative to the more distal vessel. Distal mural calcifications are also present without significant stenosis. Vertebral arteries: The left vertebral artery is dominant to the right. Vertebral arteries originate from the subclavian arteries without significant stenosis. No significant stenosis is present in either vertebral artery in the neck. Skeleton: Multilevel degenerative changes are present in the cervical spine. Neck close is present across the C5-6 disc space. Posterior elements are fused at C5-6 on the left. No focal osseous lesions are present. Other neck: Soft tissues the neck are otherwise unremarkable. Salivary glands are within normal limits. Thyroid is normal. No significant adenopathy is present. No focal mucosal or submucosal lesions are present. Upper chest: The lung apices are clear. The thoracic inlet is within limits. Review of the MIP images confirms the above findings CTA HEAD FINDINGS Anterior circulation: Atherosclerotic calcifications are present within the cavernous internal carotid arteries bilaterally. Moderate stenosis is present right supraclinoid segment. No significant stenosis is present on the left. The ICA  termini are patent bilaterally. The left A1 segment is dominant. The anterior communicating  arteries scratched at the anterior communicating artery is patent. The ACA and MCA branch vessels are within normal limits bilaterally. Posterior circulation: The vertebral arteries are codominant at the foramen magnum. Atherosclerotic changes are present in the left V4 segment. Moderate segmental stenosis present proximally. A severe distal left V4 segment stenosis is present. The right vertebral artery is within normal limits. The basilar artery is normal. The superior cerebellar arteries are patent bilaterally. The left posterior cerebral artery originates from basilar tip. The right posterior cerebral artery is of fetal type. High-grade stenosis is present in the proximal right P2 segment. Distal right PCA branch vessels are patent bilaterally. No significant left-sided stenosis is present. Venous sinuses: The dural sinuses are patent. The straight sinus and deep cerebral veins are intact. Cortical veins are within normal limits. No significant vascular malformation is evident. Anatomic variants: Fetal type right posterior cerebral artery Review of the MIP images confirms the above findings IMPRESSION: 1. High-grade stenosis of the proximal right P2 segment. This is likely related to the right thalamic stroke in the region of the thalamostriate perforators. 2. Moderate stenosis of the right supraclinoid internal carotid artery. 3. No significant stenosis of the left cavernous internal carotid artery. 4. Moderate segmental stenosis of the left V4 segment. 5. Atherosclerotic changes at the proximal internal carotid arteries bilaterally without significant stenosis relative to the more distal vessels. 6. Multilevel spondylosis of the cervical spine. 7.  Aortic Atherosclerosis (ICD10-I70.0). Electronically Signed   By: Marin Roberts M.D.   On: 09/17/2022 17:43   MR BRAIN WO CONTRAST  Result Date: 09/17/2022 CLINICAL DATA:  Left facial droop.  Confusion. EXAM: MRI HEAD WITHOUT CONTRAST TECHNIQUE:  Multiplanar, multiecho pulse sequences of the brain and surrounding structures were obtained without intravenous contrast. COMPARISON:  Head CT 09/17/2022 FINDINGS: Brain: There is a 1.5 cm acute infarct involving the ventral right thalamus and adjacent internal capsule. No intracranial hemorrhage, mass, midline shift, or extra-axial fluid collection is identified. Patchy T2 hyperintensities in the cerebral white matter and pons are nonspecific but compatible with moderate chronic small vessel ischemic disease. There is mild cerebral atrophy. Vascular: Major intracranial vascular flow voids are preserved. Skull and upper cervical spine: Unremarkable bone marrow signal. Sinuses/Orbits: Bilateral cataract extraction. Mild mucosal thickening in the paranasal sinuses. Trace bilateral mastoid fluid. Other: None. IMPRESSION: 1. Acute right thalamocapsular infarct. 2. Moderate chronic small vessel ischemic disease. Electronically Signed   By: Sebastian Ache M.D.   On: 09/17/2022 16:11   CT HEAD WO CONTRAST  Result Date: 09/17/2022 CLINICAL DATA:  Provided history: Mental status change, unknown cause. Memory loss. EXAM: CT HEAD WITHOUT CONTRAST TECHNIQUE: Contiguous axial images were obtained from the base of the skull through the vertex without intravenous contrast. RADIATION DOSE REDUCTION: This exam was performed according to the departmental dose-optimization program which includes automated exposure control, adjustment of the mA and/or kV according to patient size and/or use of iterative reconstruction technique. COMPARISON:  Prior head CT examinations 07/13/2019 and earlier. FINDINGS: Brain: Mild generalized cerebral atrophy. Patchy and ill-defined hypoattenuation within the cerebral white matter, nonspecific but compatible with moderate chronic small vessel ischemic disease. There is no acute intracranial hemorrhage. No demarcated cortical infarct. No extra-axial fluid collection. No evidence of an intracranial  mass. No midline shift. Vascular: No hyperdense vessel.  Atherosclerotic calcifications. Skull: No fracture or aggressive osseous lesion. Sinuses/Orbits: No mass or acute finding within the imaged orbits. Postsurgical  appearance of the paranasal sinuses. Minimal mucosal thickening, and tiny mucous retention cyst or polyp, within the right maxillary sinus. Mild mucosal thickening within the bilateral ethmoid sinuses/ethmoidectomy cavities. Mucosal thickening within the inferior left frontal sinus. IMPRESSION: 1.  No evidence of an acute intracranial abnormality. 2. Moderate chronic small vessel ischemic changes within the cerebral white matter. 3. Mild generalized cerebral atrophy. 4. Paranasal sinus disease as described. Electronically Signed   By: Jackey Loge D.O.   On: 09/17/2022 11:14    Assessment and Plan  Shaunette Gassner is a very pleasant 89 y.o. female with medical history significant of HLD, HTN, LBBB, CHF last ef 40% per cards related to LBBB,recurrent UTI,who presents to ED BIB EMS with complaint of left sided facial droop, slurred speech and confusion. Per family patient was noted have increase confusion and lethargy starting 2days ago.   CTA High-grade stenosis of the proximal right P2 segment. This is likely related to the right thalamic stroke in the region of the thalamostriate perforators. 2. Moderate stenosis of the right supraclinoid internal carotid artery. 3. No significant stenosis of the left cavernous internal carotid artery.  MRI brain  Acute right thalamocapsular infarct. 2. Moderate chronic small vessel ischemic disease.  Acute right thalamocapsular infarct  -CTA and MRI as above --symptoms of mild left sided facial droop persistent --now improving - case discussed with neurology  -admit tia/cva r/o protocol -Permissive HTN x48 hours  -goal BP < 220/110, PRN above these parameters -- ASA 81mg  daily + plavix 75mg   ordered -duration as per neurology  -- PT OT recommends  home health. Patient lives at [facility] [place]. TOC for discharge planning. -- echo results pending -await final neuro recs  Dr Wilford Corner aware   CHF chronic systolic in setting of LBBB -ef 40%   -currently compensated  -not on GDMT  -follows with [facility] cardiology -repeat echo pending  HLD -diet controlled  - has allergy to statin     HTN -hold oral medication for permissive htn      DVT prophylaxis: heparin Code Status: full/ as discussed per patient wishes   Family Communication: Whitehouse,Joanna (Daughter) at bedside [phone] (Home Phone)   Patient will discharge in 1 to 2 days back to [facility] appropriate with home health. TOC for discharge planning  TOTAL TIME TAKING CARE OF THIS PATIENT: 35 minutes.  >50% time spent on counselling and coordination of care  Note: This dictation was prepared with Dragon dictation along with smaller phrase technology. Any transcriptional errors that result from this process are unintentional.  Enedina Finner M.D    [facility]   CC: Primary care physician; Lauro Regulus, MD

## 2022-09-18 NOTE — Consult Note (Signed)
Neurology Consultation  Reason for Consult: Stroke Referring Physician: Dr. Eliane Decree  CC: Altered mental status, question left-sided facial droop and slurred speech  History is obtained from: Patient, chart patient's daughter at bedside  HPI: Maria [name] is a 89 y.o. female past medical history of hypertension hyperlipidemia systolic heart failure brought in with complaints of confusion for now multiple days with last known well somewhere on Friday. Family noted that she was not acting like herself.  Usually she is very regimented in her daily routines but she was not waking up or sleeping at times that she usually does.  She was also noted to have behavior that was out of character-watching movies with cartoons that she usually does not and sitting in her recliner for prolonged time.  There was also question of slurred speech and left-sided facial droop. She was brought into the ED for further evaluation.  MRI of the brain and was completed and revealed a right thalamic capsular infarct for which she was admitted. She is a retired professor of music and education and lives in an independent living facility in her own apartment. Family does report some amount of cognitive decline in terms of memory and ability to take care of herself but nothing that was out of ordinary till this Friday when the family came in and noticed that she had a part on the stove with nothing in it and the flame under the pot running.    LKW: 4 days to 5 days ago IV thrombolysis given?: no, outside the window EVT: No ELVO Premorbid modified Rankin scale (mRS):1   ROS: Full ROS was performed and is negative except as noted in the HPI.   Past Medical History:  Diagnosis Date   Arthritis    fingers, knees   Cancer (HCC)    melanoma   CHF (congestive heart failure) (HCC)    Complication of anesthesia    Pt reports "coded" during spinal injection during back surgery - 8 yrs ago - [place], [place]   Diverticulitis     Environmental and seasonal allergies    Hypercholesteremia    Hypertension    Left arm weakness    positional   Left bundle branch block (LBBB)    Motion sickness    seasick   Seizures (HCC) 06/2013   reaction to exposure to allergan (dogs and cats)   Vasovagal episode      Family History  Problem Relation Age of Onset   Kidney Stones Mother    Ovarian cancer Mother    Heart disease Mother    Heart disease Father    Alzheimer's disease Sister    Heart disease Maternal Grandmother    Heart disease Maternal Grandfather    Heart disease Paternal Grandmother    Kidney disease Neg Hx      Social History:   reports that she has never smoked. She has never used smokeless tobacco. She reports that she does not drink alcohol and does not use drugs.  Medications  Current Facility-Administered Medications:    acetaminophen (TYLENOL) tablet 650 mg, 650 mg, Oral, Q4H PRN **OR** acetaminophen (TYLENOL) 160 MG/5ML solution 650 mg, 650 mg, Per Tube, Q4H PRN **OR** acetaminophen (TYLENOL) suppository 650 mg, 650 mg, Rectal, Q4H PRN, Lurline Del, MD   aspirin chewable tablet 81 mg, 81 mg, Oral, Daily, Skip Mayer A, MD, 81 mg at 09/18/22 1045   clopidogrel (PLAVIX) tablet 75 mg, 75 mg, Oral, Daily, Skip Mayer A, MD, 75 mg at 09/18/22 1045  heparin injection 5,000 Units, 5,000 Units, Subcutaneous, Q8H, Lurline Del, MD, 5,000 Units at 09/18/22 0555   senna-docusate (Senokot-S) tablet 1 tablet, 1 tablet, Oral, QHS PRN, Lurline Del, MD  Exam: Current vital signs: BP 137/62 (BP Location: Right Arm)   Pulse 65   Temp 97.8 F (36.6 C)   Resp 18   Ht 5\' 3"  (1.6 m)   Wt 62.2 kg   SpO2 96%   BMI 24.29 kg/m  Vital signs in last 24 hours: Temp:  [97.7 F (36.5 C)-98.3 F (36.8 C)] 97.8 F (36.6 C) (06/25 0726) Pulse Rate:  [64-88] 65 (06/25 0726) Resp:  [14-18] 18 (06/25 0726) BP: (130-189)/(62-92) 137/62 (06/25 0726) SpO2:  [96 %-100 %] 96 % (06/25  0726) Weight:  [62.2 kg] 62.2 kg (06/24 2354) General: Awake alert in no distress HEENT: Normocephalic atraumatic Lungs clear Cardiovascular: Regular rhythm Abdomen nondistended nontender Neurological exam She is awake alert oriented to self She is aware that she is in the hospital but could not tell me where.  She could not tell me the city but told me she is in [place]. Could not tell me the month.  Could not tell me her age.  Could not tell me her correct year of birth. No aphasia.  No dysarthria. Decreased attention concentration Cranial nerves II to XII intact Motor examination with no drift in any of the 4 extremities Sensation intact to light touch Coordination examination with no dysmetria although she is slow to do all movements. NIHSS 1a Level of Conscious.: 0 1b LOC Questions: 2 1c LOC Commands: 0 2 Best Gaze: 0 3 Visual: 0 4 Facial Palsy: 0 5a Motor Arm - left: 0 5b Motor Arm - Right: 0 6a Motor Leg - Left: 0 6b Motor Leg - Right: 0 7 Limb Ataxia: 0 8 Sensory: 0 9 Best Language: 0 10 Dysarthria: 0 11 Extinct. and Inatten.: 0 TOTAL: 2   Labs I have reviewed labs in epic and the results pertinent to this consultation are:  CBC    Component Value Date/Time   WBC 6.6 09/17/2022 1044   RBC 4.69 09/17/2022 1044   HGB 13.9 09/17/2022 1044   HCT 43.3 09/17/2022 1044   PLT 185 09/17/2022 1044   MCV 92.3 09/17/2022 1044   MCH 29.6 09/17/2022 1044   MCHC 32.1 09/17/2022 1044   RDW 13.2 09/17/2022 1044   LYMPHSABS 1.4 09/17/2022 1044   MONOABS 0.4 09/17/2022 1044   EOSABS 0.3 09/17/2022 1044   BASOSABS 0.1 09/17/2022 1044    CMP     Component Value Date/Time   NA 135 09/17/2022 1044   K 4.0 09/17/2022 1044   CL 102 09/17/2022 1044   CO2 25 09/17/2022 1044   GLUCOSE 100 (H) 09/17/2022 1044   BUN 16 09/17/2022 1044   CREATININE 0.60 09/17/2022 1044   CALCIUM 9.5 09/17/2022 1044   PROT 7.5 09/17/2022 1044   ALBUMIN 4.1 09/17/2022 1044   AST 22  09/17/2022 1044   ALT 17 09/17/2022 1044   ALKPHOS 69 09/17/2022 1044   BILITOT 0.7 09/17/2022 1044   GFRNONAA >60 09/17/2022 1044   GFRAA >60 01/25/2018 0433    Lipid Panel     Component Value Date/Time   CHOL 236 (H) 09/18/2022 0445   TRIG 92 09/18/2022 0445   HDL 64 09/18/2022 0445   CHOLHDL 3.7 09/18/2022 0445   VLDL 18 09/18/2022 0445   LDLCALC 154 (H) 09/18/2022 0445    A1c 5.5.  2D  echo completed-report pending.  Imaging I have reviewed the images obtained:  CT-head-no acute changes  MRI examination of the brain-acute/subacute right thalamic capsular area of restricted diffusion.  CT angiography head and neck with high-grade stenosis of the proximal right P2 segment-likely related to the right thalamic stroke in the region of the perforators in the talabostat region.  Moderate stenosis of the left cavernous ICA.  Moderate stenosis of the left V4.  Atherosclerotic changes of the proximal ICA bilaterally without significant stenosis  Assessment:  89 year old hypertension hyperlipidemia systolic heart failure presenting with complaints of confusion and noted to have right thalamic capsular infarct. She was also noted to have significant stenosis in her PCA especially in the P2 segment of the proximal right PCA which could be related to the stroke. Her confusion and mental status deterioration is likely related to unmasking of underlying cognitive deficits in the setting of acute stroke. Her workup from a stroke risk factor standpoint is only pending and 2D echocardiogram.   Impression:  Right thalamic capsular infarct-secondary to right P2 stenosis versus small vessel etiology Underlying cognitive deficits/dementia with unmasking in the setting of acute stroke.  Recommendations: For now, I would recommend aspirin 325+ Plavix 75 for 3 months followed by aspirin only-due to the fact that the right P2 stenosis might be the etiology for the right thalamic infarct. Ideally  she should be on a statin for goal LDL less than 70.  Spoke with the daughter in detail-she had severe myalgias with statins in the recent past and would not like to continue that.  She should be on Zetia with outpatient referral for cholesterol clinic for consideration of Repatha. PT OT speech therapy Will follow 2D echo results. Outpatient neurology follow-up in 8 to 12 weeks for stroke follow-up as well as follow-up for neurocognitive testing for possible underlying dementia. Plan discussed with Dr. Allena Katz via secure chat  -- Milon Dikes, MD Neurologist [facility] Pager: [phone]

## 2022-09-18 NOTE — Progress Notes (Signed)
*  PRELIMINARY RESULTS* Echocardiogram 2D Echocardiogram has been performed.  Maria [name] 09/18/2022, 4:07 PM

## 2022-09-19 ENCOUNTER — Other Ambulatory Visit (HOSPITAL_COMMUNITY): Payer: Self-pay

## 2022-09-19 DIAGNOSIS — I639 Cerebral infarction, unspecified: Secondary | ICD-10-CM | POA: Diagnosis not present

## 2022-09-19 LAB — ECHOCARDIOGRAM COMPLETE
AR max vel: 2.36 cm2
AV Area VTI: 2.13 cm2
AV Area mean vel: 2.06 cm2
AV Mean grad: 6 mmHg
AV Peak grad: 11.3 mmHg
Ao pk vel: 1.68 m/s
Area-P 1/2: 1.6 cm2
Height: 63 in
MV VTI: 3.21 cm2
S' Lateral: 2.2 cm
Weight: 2194.02 oz

## 2022-09-19 MED ORDER — CLOPIDOGREL BISULFATE 75 MG PO TABS: 75.0000 mg | ORAL_TABLET | Freq: Every day | ORAL | 2 refills | Status: AC

## 2022-09-19 MED ORDER — EZETIMIBE 10 MG PO TABS: 10.0000 mg | ORAL_TABLET | Freq: Every day | ORAL | 3 refills | Status: AC

## 2022-09-19 MED ORDER — ASPIRIN 325 MG PO TABS: 325.0000 mg | ORAL_TABLET | Freq: Every day | ORAL | 3 refills | Status: AC

## 2022-09-19 NOTE — [place] FL2 (Signed)
[place] MEDICAID FL2 LEVEL OF CARE FORM     IDENTIFICATION  Patient Name: Maria [name] Birthdate: Dec 10, 1930 Sex: female Admission Date (Current Location): 09/17/2022  [facility] and [place] Number:  Chiropodist and Address:  [facility], [address] [place], [place] [address]      Provider Number: [id number]  Attending Physician Name and Address:  Enedina Finner, MD  Relative Name and Phone Number:       Current Level of Care: Hospital Recommended Level of Care: Skilled Nursing Facility Prior Approval Number:    Date Approved/Denied:   PASRR Number: [id number]  Discharge Plan: SNF    Current Diagnoses: Patient Active Problem List   Diagnosis Date Noted   Acute CVA (cerebrovascular accident) (HCC) 09/17/2022   Chronic constipation 02/01/2018   Chronic gout of multiple sites 02/01/2018   Glaucoma, bilateral 02/01/2018   Subcapital fracture of femur, right, closed, with routine healing, subsequent encounter 02/01/2018   Hip fracture (HCC) 01/24/2018   Colonic diverticular abscess 04/02/2016   AKI (acute kidney injury) (HCC) 12/10/2015   Diverticulitis of large intestine without perforation or abscess without bleeding 10/17/2015   Chronic systolic CHF (congestive heart failure), NYHA class 2 (HCC) 03/24/2015   Benign essential hypertension 03/16/2015   Hyperlipidemia, mixed 03/16/2015   Bundle branch block, left 09/01/2014   Status post total right knee replacement 08/27/2014   Diverticulosis of large intestine without hemorrhage 06/08/2014   Allergic rhinitis due to animal hair and dander 04/30/2014    Orientation RESPIRATION BLADDER Height & Weight     Self, Time, Place  Normal Continent Weight: 132 lb 0.9 oz (59.9 kg) Height:  5\' 3"  (160 cm)  BEHAVIORAL SYMPTOMS/MOOD NEUROLOGICAL BOWEL NUTRITION STATUS   (None)  (None) Continent Diet (Heart healthy)  AMBULATORY STATUS COMMUNICATION OF NEEDS Skin   Limited Assist Verbally  Normal                       Personal Care Assistance Level of Assistance  Bathing, Feeding, Dressing Bathing Assistance: Limited assistance Feeding assistance: Independent Dressing Assistance: Limited assistance     Functional Limitations Info  Sight, Hearing, Speech Sight Info: Adequate Hearing Info: Adequate Speech Info: Adequate    SPECIAL CARE FACTORS FREQUENCY  PT (By licensed PT), OT (By licensed OT)     PT Frequency: 5 x week OT Frequency: 5 x week            Contractures Contractures Info: Not present    Additional Factors Info  Code Status, Allergies Code Status Info: DNR Allergies Info: Dog Epithelium (Canis Lupus Familiaris), Penicillin G, Sulfa Antibiotics, Sulfamethoxazole-trimethoprim, Uncaria Tomentosa (Cats Claw), Guaifenesin, Levofloxacin, Metronidazole, Molds & Smuts, Articaine-epinephrine, Erythromycin, Hydralazine Hcl, Nitrofurantoin, Other, Propoxyphene, Spinach, Statins, Tape, Tetracycline           Current Medications (09/19/2022):  This is the current hospital active medication list Current Facility-Administered Medications  Medication Dose Route Frequency Provider Last Rate Last Admin   acetaminophen (TYLENOL) tablet 650 mg  650 mg Oral Q4H PRN Lurline Del, MD       Or   acetaminophen (TYLENOL) 160 MG/5ML solution 650 mg  650 mg Per Tube Q4H PRN Lurline Del, MD       Or   acetaminophen (TYLENOL) suppository 650 mg  650 mg Rectal Q4H PRN Lurline Del, MD       aspirin tablet 325 mg  325 mg Oral Daily Milon Dikes, MD  clopidogrel (PLAVIX) tablet 75 mg  75 mg Oral Daily Skip Mayer A, MD   75 mg at 09/18/22 1045   ezetimibe (ZETIA) tablet 10 mg  10 mg Oral Daily Enedina Finner, MD   10 mg at 09/18/22 1732   heparin injection 5,000 Units  5,000 Units Subcutaneous Q8H Skip Mayer A, MD   5,000 Units at 09/19/22 0512   latanoprost (XALATAN) 0.005 % ophthalmic solution 1 drop  1 drop Both Eyes QHS Enedina Finner, MD   1 drop at 09/18/22 2139   senna-docusate (Senokot-S) tablet 1 tablet  1 tablet Oral QHS PRN Lurline Del, MD         Discharge Medications: Please see discharge summary for a list of discharge medications.  Relevant Imaging Results:  Relevant Lab Results:   Additional Information SS#: [id number]  Margarito Liner, LCSW

## 2022-09-19 NOTE — TOC Initial Note (Signed)
Transition of Care [facility]) - Initial/Assessment Note    Patient Details  Name: [name] MRN: [id number] Date of Birth: January 04, 1931  Transition of Care [facility]) CM/SW Contact:    Margarito Liner, LCSW Phone Number: 09/19/2022, 10:45 AM  Clinical Narrative:   CSW met with patient. Daughter at bedside. CSW introduced role and explained that discharge planning would be discussed. Patient lives at [facility]. CSW spoke with Pam at the facility who said they plan to bring her into their SNF side to make sure she is appropriate to transition to their ALF. Patient has not met 3-night inpatient stay so she will use her 14 respite days. They can take her today. Patient and daughter are aware and agreeable. Daughter will transport. Signed DNR is on the chart. No further concerns. CSW encouraged patient and her daughter to contact CSW as needed. CSW will continue to follow patient for support and facilitate discharge to SNF today.              Expected Discharge Plan: Skilled Nursing Facility Barriers to Discharge: No Barriers Identified   Patient Goals and CMS Choice     Choice offered to / list presented to : Patient, Adult Children      Expected Discharge Plan and Services     Post Acute Care Choice: Skilled Nursing Facility Living arrangements for the past 2 months: Independent Living Facility                                      Prior Living Arrangements/Services Living arrangements for the past 2 months: Independent Living Facility Lives with:: Self Patient language and need for interpreter reviewed:: Yes Do you feel safe going back to the place where you live?: Yes      Need for Family Participation in Patient Care: Yes (Comment) Care giver support system in place?: Yes (comment)   Criminal Activity/Legal Involvement Pertinent to Current Situation/Hospitalization: No - Comment as needed  Activities of Daily Living Home Assistive Devices/Equipment: Walker  (specify type), Eyeglasses ADL Screening (condition at time of admission) Patient's cognitive ability adequate to safely complete daily activities?: Yes Is the patient deaf or have difficulty hearing?: No Does the patient have difficulty seeing, even when wearing glasses/contacts?: No Does the patient have difficulty concentrating, remembering, or making decisions?: No Patient able to express need for assistance with ADLs?: Yes Does the patient have difficulty dressing or bathing?: No Independently performs ADLs?: No Communication: Independent Dressing (OT): Independent Grooming: Independent Feeding: Independent Bathing: Needs assistance Is this a change from baseline?: Pre-admission baseline Toileting: Needs assistance Is this a change from baseline?: Pre-admission baseline In/Out Bed: Needs assistance Is this a change from baseline?: Change from baseline, expected to last <3 days Walks in Home: Needs assistance Is this a change from baseline?: Change from baseline, expected to last <3 days Does the patient have difficulty walking or climbing stairs?: Yes Weakness of Legs: Both Weakness of Arms/Hands: None  Permission Sought/Granted Permission sought to share information with : Facility Medical sales representative, Family Supports    Share Information with NAME: Maria [name]  Permission granted to share info w AGENCY: [employer] granted to share info w Relationship: Daughter  Permission granted to share info w Contact Information: [phone]  Emotional Assessment Appearance:: Appears stated age Attitude/Demeanor/Rapport: Engaged, Gracious Affect (typically observed): Accepting, Appropriate, Calm, Pleasant Orientation: : Oriented to Self, Oriented to  Place, Oriented to  Time, Oriented to Situation Alcohol / Substance Use: Not Applicable Psych Involvement: No (comment)  Admission diagnosis:  Confusion [R41.0] Facial droop [R29.810] Acute CVA  (cerebrovascular accident) [facility]) [I63.9] Patient Active Problem List   Diagnosis Date Noted   Acute CVA (cerebrovascular accident) (HCC) 09/17/2022   Chronic constipation 02/01/2018   Chronic gout of multiple sites 02/01/2018   Glaucoma, bilateral 02/01/2018   Subcapital fracture of femur, right, closed, with routine healing, subsequent encounter 02/01/2018   Hip fracture (HCC) 01/24/2018   Colonic diverticular abscess 04/02/2016   AKI (acute kidney injury) (HCC) 12/10/2015   Diverticulitis of large intestine without perforation or abscess without bleeding 10/17/2015   Chronic systolic CHF (congestive heart failure), NYHA class 2 (HCC) 03/24/2015   Benign essential hypertension 03/16/2015   Hyperlipidemia, mixed 03/16/2015   Bundle branch block, left 09/01/2014   Status post total right knee replacement 08/27/2014   Diverticulosis of large intestine without hemorrhage 06/08/2014   Allergic rhinitis due to animal hair and dander 04/30/2014   PCP:  Lauro Regulus, MD Pharmacy:   [id number] Nich[place] Rough, [place] - [address] AT [facility] OF [address] [place] [address] Phone: [phone] Fax: [phone]  [facility] - [address] [place], [place] - [address] [place] [address] Phone: [phone] Fax: [phone]  [facility] - [place], [place] - [address] Renee Harder [place] [address] Phone: [phone] Fax: [phone]     Social Determinants of Health (SDOH) Social History: SDOH Screenings   Food Insecurity: No Food Insecurity (09/19/2022)  Housing: Low Risk  (09/19/2022)  Transportation Needs: No Transportation Needs (09/19/2022)  Utilities: Not At Risk (09/19/2022)  Tobacco Use: Low Risk  (09/17/2022)   SDOH Interventions:     Readmission Risk Interventions     No data to display

## 2022-09-19 NOTE — TOC Transition Note (Signed)
Transition of Care [facility]) - CM/SW Discharge Note   Patient Details  Name: Maria [name] MRN: [id number] Date of Birth: 10-Nov-1930  [facility] CM/SW Contact:  Margarito Liner, LCSW Phone Number: 09/19/2022, 11:47 AM   Clinical Narrative: Patient has orders to discharge to [facility] SN[facility] today. RN will call report to [phone] (Room 343). Daughter will transport. No further concerns. CSW signing off.    Final next level of care: Skilled Nursing Facility Barriers to Discharge: Barriers Resolved   Patient Goals and CMS Choice   Choice offered to / list presented to : Patient, Adult Children  Discharge Placement     Existing PASRR number confirmed : 09/19/22          Patient chooses bed at: [facility] Patient to be transferred to facility by: Daughter Name of family member notified: Andrina Locken Patient and family notified of of transfer: 09/19/22  Discharge Plan and Services Additional resources added to the After Visit Summary for       Post Acute Care Choice: Skilled Nursing Facility                               Social Determinants of Health (SDOH) Interventions SDOH Screenings   Food Insecurity: No Food Insecurity (09/19/2022)  Housing: Low Risk  (09/19/2022)  Transportation Needs: No Transportation Needs (09/19/2022)  Utilities: Not At Risk (09/19/2022)  Tobacco Use: Low Risk  (09/17/2022)     Readmission Risk Interventions     No data to display

## 2022-09-19 NOTE — TOC Benefit Eligibility Note (Signed)
Pharmacy Patient Advocate Encounter  Insurance verification completed.    The patient is insured through [facility] Medicare Part D  Ran test claim for Repatha Sure Click 140 mg/ml Soaj and Not on Formulary   This test claim was processed through [facility]- copay amounts may vary at other pharmacies due to [employer], or as the patient moves through the different stages of their insurance plan.    Roland Earl, CPHT Pharmacy Patient Advocate Specialist [facility] Patient Advocate Team Direct Number: [phone]  Fax: [phone]

## 2022-09-19 NOTE — Plan of Care (Signed)
2d echo reviewed - normal EF, normal LA size. Recs as in consult note. Please call with questions.   -- Milon Dikes, MD Neurologist [facility] Pager: [phone]

## 2022-09-19 NOTE — Discharge Summary (Signed)
Physician Discharge Summary   Patient: Maria [name] MRN: [id number] DOB: 11-15-1930  Admit date:     09/17/2022  Discharge date: 09/19/22  Discharge Physician: Maria [name]   PCP: Maria Regulus, MD   Recommendations at discharge:   F/u With Maria Maria [name] in 1-2 weeks  Discharge Diagnoses: Principal Problem:   Acute CVA (cerebrovascular accident) (HCC)  Maria [name] is a very pleasant 89 y.o. female with medical history significant of HLD, HTN, LBBB, CHF last ef 40% per cards related to LBBB,recurrent UTI,who presents to ED BIB EMS with complaint of left sided facial droop, slurred speech and confusion. Per family patient was noted have increase confusion and lethargy starting 2days ago.    CTA High-grade stenosis of the proximal right P2 segment. This is likely related to the right thalamic stroke in the region of the thalamostriate perforators. 2. Moderate stenosis of the right supraclinoid internal carotid artery. 3. No significant stenosis of the left cavernous internal carotid artery.   MRI brain  Acute right thalamocapsular infarct. 2. Moderate chronic small vessel ischemic disease.   Acute right thalamocapsular infarct  -CTA and MRI as above --symptoms of mild left sided facial droop persistent --now improving - case discussed with neurology  -admit tia/cva r/o protocol -Permissive HTN x48 hours  -goal BP < 220/110, PRN above these parameters -- ASA 325 mg daily + plavix 75mg   ordered  for 3 months and then cont asa-- as per neurology  -- PT OT recommends home health. Patient lives at [facility]. TOC for discharge planning. -- echo results noted. EF ok -d/w Maria [name].   CHF chronic systolic in setting of LBBB -ef 40%   -currently compensated  -not on GDMT  -follows with Maria [name] cardiology   HLD -diet controlled  - has allergy to statin     HTN --resume home meds  Will discharge to ALF (at [facility])     DVT prophylaxis: heparin Code Status: full/ as  discussed per patient wishes  Family Communication: Maria [name] (Daughter) at bedside          Consultants: Neurology Disposition: Assisted living Diet recommendation:  Discharge Diet Orders (From admission, onward)     Start     Ordered   09/19/22 0000  Diet - low sodium heart healthy        09/19/22 1047           Cardiac and Carb modified diet DISCHARGE MEDICATION: Allergies as of 09/19/2022       Reactions   Dog Epithelium (canis Lupus Familiaris) Shortness Of Breath   Seizure   Penicillin G Hives   AGE 57 yo PCN allergy   Sulfa Antibiotics Other (See Comments), Anaphylaxis   Sulfamethoxazole-trimethoprim Itching, Palpitations, Other (See Comments), Rash, Swelling   Shaking, redness, sneezing   Uncaria Tomentosa (cats Claw) Anaphylaxis   Seizure   Guaifenesin Other (See Comments)   Insomnia and anxiety   Levofloxacin Other (See Comments)   750mg  dosage after 24 hours caused confusion, off-balanced CNS   Metronidazole Nausea Only   Abdominal cramping  Gi upset   Molds & Smuts Cough   Articaine-epinephrine Other (See Comments), Swelling   At site, nerve loss for few days, used by dentist   Erythromycin Nausea Only, Rash   Hydralazine Hcl Other (See Comments)   hyponatremia   Nitrofurantoin Rash, Nausea Only   Other Other (See Comments)   septocaine caused numbness   Propoxyphene Other (See Comments)   Altered mental status   Spinach  Palpitations   Irregular heartbeat   Statins Other (See Comments)   Unknown reaction   Tape Rash   Prefers paper tape   Tetracycline Other (See Comments), Nausea Only, Rash        Medication List     TAKE these medications    amLODipine 5 MG tablet Commonly known as: NORVASC Take 5 mg by mouth at bedtime.   aspirin 325 MG tablet Take 1 tablet (325 mg total) by mouth daily. Start taking on: September 20, 2022   cholecalciferol 25 MCG (1000 UNIT) tablet Commonly known as: VITAMIN D3 Take 1,000 Units by mouth  daily.   clopidogrel 75 MG tablet Commonly known as: PLAVIX Take 1 tablet (75 mg total) by mouth daily. Start taking on: September 20, 2022   Cranberry-Vitamin C 15000-100 MG Caps Take 2 capsules by mouth daily.   ezetimibe 10 MG tablet Commonly known as: ZETIA Take 1 tablet (10 mg total) by mouth daily. Start taking on: September 20, 2022   latanoprost 0.005 % ophthalmic solution Commonly known as: XALATAN Place 1 drop into both eyes at bedtime.   Magnesium 250 MG Tabs Take 250 mg by mouth daily.   magnesium chloride 64 MG Tbec SR tablet Commonly known as: SLOW-MAG Take 1 tablet by mouth daily.   ramipril 10 MG capsule Commonly known as: ALTACE Take 10 mg by mouth daily.        Follow-up Information     Maria Regulus, MD. Schedule an appointment as soon as possible for a visit in 1 week(s).   Specialty: Internal Medicine Contact information: [address] [place] [address] [phone]                Discharge Exam: Ceasar Mons Weights   09/17/22 1029 09/17/22 2354 09/19/22 0548  Weight: 63.5 kg 62.2 kg 59.9 kg  GENERAL:  89 y.o.-year-old patient with no acute distress.  LUNGS: Normal breath sounds bilaterally, no wheezing CARDIOVASCULAR: S1, S2 normal. No murmur   ABDOMEN: Soft, nontender, nondistended. Bowel sounds present.  EXTREMITIES: No  edema b/l.    NEUROLOGIC: nonfocal  patient is alert and awake   Condition at discharge: fair  The results of significant diagnostics from this hospitalization (including imaging, microbiology, ancillary and laboratory) are listed below for reference.   Imaging Studies: ECHOCARDIOGRAM COMPLETE  Result Date: 09/19/2022    ECHOCARDIOGRAM REPORT   Patient Name:   [name] Date of Exam: 09/18/2022 Medical Rec #:  [id number]  Height:       63.0 in Accession #:    [id number] Weight:       137.1 lb Date of Birth:  1930-05-05  BSA:          1.647 m Patient Age:    89 years   BP:           137/62 mmHg Patient  Gender: F          HR:           69 bpm. Exam Location:  [facility] Procedure: 2D Echo, Cardiac Doppler and Color Doppler Indications:     Stroke  History:         Patient has no prior history of Echocardiogram examinations.                  CHF, Stroke, Arrythmias:LBBB; Risk Factors:Hypertension and                  Dyslipidemia.  Sonographer:     Nicole Cella  Wynona Neat Referring Phys:  [id number] SARA-MAIZ A THOMAS Diagnosing Phys: Yvonne Kendall MD IMPRESSIONS  1. Left ventricular ejection fraction, by estimation, is 55 to 60%. The left ventricle has normal function. The left ventricle has no regional wall motion abnormalities. There is moderate asymmetric left ventricular hypertrophy of the basal-septal segment. Left ventricular diastolic parameters are consistent with Grade I diastolic dysfunction (impaired relaxation).  2. Right ventricular systolic function is normal. The right ventricular size is normal. There is normal pulmonary artery systolic pressure.  3. The mitral valve is normal in structure. Trivial mitral valve regurgitation. No evidence of mitral stenosis.  4. Tricuspid valve regurgitation is mild to moderate.  5. The aortic valve is tricuspid. There is mild thickening of the aortic valve. Aortic valve regurgitation is not visualized. Aortic valve sclerosis is present, with no evidence of aortic valve stenosis.  6. The inferior vena cava is normal in size with greater than 50% respiratory variability, suggesting right atrial pressure of 3 mmHg. FINDINGS  Left Ventricle: Left ventricular ejection fraction, by estimation, is 55 to 60%. The left ventricle has normal function. The left ventricle has no regional wall motion abnormalities. The left ventricular internal cavity size was normal in size. There is  moderate asymmetric left ventricular hypertrophy of the basal-septal segment. Left ventricular diastolic parameters are consistent with Grade I diastolic dysfunction (impaired relaxation). Right Ventricle: The  right ventricular size is normal. No increase in right ventricular wall thickness. Right ventricular systolic function is normal. There is normal pulmonary artery systolic pressure. The tricuspid regurgitant velocity is 2.47 m/s, and  with an assumed right atrial pressure of 3 mmHg, the estimated right ventricular systolic pressure is 27.4 mmHg. Left Atrium: Left atrial size was normal in size. Right Atrium: Right atrial size was normal in size. Pericardium: There is no evidence of pericardial effusion. Mitral Valve: The mitral valve is normal in structure. Trivial mitral valve regurgitation. No evidence of mitral valve stenosis. MV peak gradient, 4.0 mmHg. The mean mitral valve gradient is 1.0 mmHg. Tricuspid Valve: The tricuspid valve is normal in structure. Tricuspid valve regurgitation is mild to moderate. Aortic Valve: The aortic valve is tricuspid. There is mild thickening of the aortic valve. Aortic valve regurgitation is not visualized. Aortic valve sclerosis is present, with no evidence of aortic valve stenosis. Aortic valve mean gradient measures 6.0  mmHg. Aortic valve peak gradient measures 11.3 mmHg. Aortic valve area, by VTI measures 2.13 cm. Pulmonic Valve: The pulmonic valve was normal in structure. Pulmonic valve regurgitation is mild. No evidence of pulmonic stenosis. Aorta: The aortic root and ascending aorta are structurally normal, with no evidence of dilitation. Pulmonary Artery: The pulmonary artery is of normal size. Venous: The inferior vena cava is normal in size with greater than 50% respiratory variability, suggesting right atrial pressure of 3 mmHg. IAS/Shunts: The interatrial septum was not well visualized.  LEFT VENTRICLE PLAX 2D LVIDd:         3.70 cm   Diastology LVIDs:         2.20 cm   LV e' medial:    5.77 cm/s LV PW:         1.30 cm   LV E/e' medial:  8.2 LV IVS:        1.66 cm   LV e' lateral:   5.98 cm/s LVOT diam:     1.90 cm   LV E/e' lateral: 7.9 LV SV:         78 LV SV  Index:  48 LVOT Area:     2.84 cm  RIGHT VENTRICLE RV Basal diam:  3.65 cm RV Mid diam:    3.00 cm TAPSE (M-mode): 2.4 cm LEFT ATRIUM             Index        RIGHT ATRIUM           Index LA diam:        2.60 cm 1.58 cm/m   RA Area:     15.10 cm LA Vol (A2C):   53.1 ml 32.24 ml/m  RA Volume:   35.40 ml  21.49 ml/m LA Vol (A4C):   45.6 ml 27.69 ml/m LA Biplane Vol: 52.4 ml 31.81 ml/m  AORTIC VALVE                     PULMONIC VALVE AV Area (Vmax):    2.36 cm      PV Vmax:       0.87 m/s AV Area (Vmean):   2.06 cm      PV Peak grad:  3.0 mmHg AV Area (VTI):     2.13 cm AV Vmax:           168.00 cm/s AV Vmean:          119.000 cm/s AV VTI:            0.368 m AV Peak Grad:      11.3 mmHg AV Mean Grad:      6.0 mmHg LVOT Vmax:         140.00 cm/s LVOT Vmean:        86.600 cm/s LVOT VTI:          0.276 m LVOT/AV VTI ratio: 0.75  AORTA Ao Root diam: 3.20 cm Ao Asc diam:  3.30 cm MITRAL VALVE                TRICUSPID VALVE MV Area (PHT): 1.60 cm     TR Peak grad:   24.4 mmHg MV Area VTI:   3.21 cm     TR Vmax:        247.00 cm/s MV Peak grad:  4.0 mmHg MV Mean grad:  1.0 mmHg     SHUNTS MV Vmax:       1.00 m/s     Systemic VTI:  0.28 m MV Vmean:      49.7 cm/s    Systemic Diam: 1.90 cm MV Decel Time: 473 msec MV E velocity: 47.20 cm/s MV A velocity: 105.00 cm/s MV E/A ratio:  0.45 Cristal Deer End MD Electronically signed by Yvonne Kendall MD Signature Date/Time: 09/19/2022/9:47:22 AM    Final    CT ANGIO HEAD NECK W WO CM  Result Date: 09/17/2022 CLINICAL DATA:  Left facial droop. Confusion. Right thalamic infarcts. EXAM: CT ANGIOGRAPHY HEAD AND NECK WITH AND WITHOUT CONTRAST TECHNIQUE: Multidetector CT imaging of the head and neck was performed using the standard protocol during bolus administration of intravenous contrast. Multiplanar CT image reconstructions and MIPs were obtained to evaluate the vascular anatomy. Carotid stenosis measurements (when applicable) are obtained utilizing NASCET criteria,  using the distal internal carotid diameter as the denominator. RADIATION DOSE REDUCTION: This exam was performed according to the departmental dose-optimization program which includes automated exposure control, adjustment of the mA and/or kV according to patient size and/or use of iterative reconstruction technique. CONTRAST:  75mL OMNIPAQUE IOHEXOL 350 MG/ML SOLN COMPARISON:  CT head without contrast 09/17/2022. MR head without contrast  09/17/2022. FINDINGS: CTA NECK FINDINGS Aortic arch: Atherosclerotic calcifications are present in the distal aortic arch in at the origin of the left subclavian artery. No significant stenosis or aneurysm is present. Right carotid system: Right common carotid artery is within normal limits. Atherosclerotic calcifications are present in the proximal right ICA without significant stenosis. Cervical right ICA is otherwise normal. Left carotid system: The left common carotid artery mildly tortuous without focal stenosis. Atherosclerotic calcifications are present the proximal left ICA without a significant stenosis relative to the more distal vessel. Distal mural calcifications are also present without significant stenosis. Vertebral arteries: The left vertebral artery is dominant to the right. Vertebral arteries originate from the subclavian arteries without significant stenosis. No significant stenosis is present in either vertebral artery in the neck. Skeleton: Multilevel degenerative changes are present in the cervical spine. Neck close is present across the C5-6 disc space. Posterior elements are fused at C5-6 on the left. No focal osseous lesions are present. Other neck: Soft tissues the neck are otherwise unremarkable. Salivary glands are within normal limits. Thyroid is normal. No significant adenopathy is present. No focal mucosal or submucosal lesions are present. Upper chest: The lung apices are clear. The thoracic inlet is within limits. Review of the MIP images confirms  the above findings CTA HEAD FINDINGS Anterior circulation: Atherosclerotic calcifications are present within the cavernous internal carotid arteries bilaterally. Moderate stenosis is present right supraclinoid segment. No significant stenosis is present on the left. The ICA termini are patent bilaterally. The left A1 segment is dominant. The anterior communicating arteries scratched at the anterior communicating artery is patent. The ACA and MCA branch vessels are within normal limits bilaterally. Posterior circulation: The vertebral arteries are codominant at the foramen magnum. Atherosclerotic changes are present in the left V4 segment. Moderate segmental stenosis present proximally. A severe distal left V4 segment stenosis is present. The right vertebral artery is within normal limits. The basilar artery is normal. The superior cerebellar arteries are patent bilaterally. The left posterior cerebral artery originates from basilar tip. The right posterior cerebral artery is of fetal type. High-grade stenosis is present in the proximal right P2 segment. Distal right PCA branch vessels are patent bilaterally. No significant left-sided stenosis is present. Venous sinuses: The dural sinuses are patent. The straight sinus and deep cerebral veins are intact. Cortical veins are within normal limits. No significant vascular malformation is evident. Anatomic variants: Fetal type right posterior cerebral artery Review of the MIP images confirms the above findings IMPRESSION: 1. High-grade stenosis of the proximal right P2 segment. This is likely related to the right thalamic stroke in the region of the thalamostriate perforators. 2. Moderate stenosis of the right supraclinoid internal carotid artery. 3. No significant stenosis of the left cavernous internal carotid artery. 4. Moderate segmental stenosis of the left V4 segment. 5. Atherosclerotic changes at the proximal internal carotid arteries bilaterally without  significant stenosis relative to the more distal vessels. 6. Multilevel spondylosis of the cervical spine. 7.  Aortic Atherosclerosis (ICD10-I70.0). Electronically Signed   By: Marin Roberts M.D.   On: 09/17/2022 17:43   MR BRAIN WO CONTRAST  Result Date: 09/17/2022 CLINICAL DATA:  Left facial droop.  Confusion. EXAM: MRI HEAD WITHOUT CONTRAST TECHNIQUE: Multiplanar, multiecho pulse sequences of the brain and surrounding structures were obtained without intravenous contrast. COMPARISON:  Head CT 09/17/2022 FINDINGS: Brain: There is a 1.5 cm acute infarct involving the ventral right thalamus and adjacent internal capsule. No intracranial hemorrhage, mass, midline shift, or  extra-axial fluid collection is identified. Patchy T2 hyperintensities in the cerebral white matter and pons are nonspecific but compatible with moderate chronic small vessel ischemic disease. There is mild cerebral atrophy. Vascular: Major intracranial vascular flow voids are preserved. Skull and upper cervical spine: Unremarkable bone marrow signal. Sinuses/Orbits: Bilateral cataract extraction. Mild mucosal thickening in the paranasal sinuses. Trace bilateral mastoid fluid. Other: None. IMPRESSION: 1. Acute right thalamocapsular infarct. 2. Moderate chronic small vessel ischemic disease. Electronically Signed   By: Sebastian Ache M.D.   On: 09/17/2022 16:11   CT HEAD WO CONTRAST  Result Date: 09/17/2022 CLINICAL DATA:  Provided history: Mental status change, unknown cause. Memory loss. EXAM: CT HEAD WITHOUT CONTRAST TECHNIQUE: Contiguous axial images were obtained from the base of the skull through the vertex without intravenous contrast. RADIATION DOSE REDUCTION: This exam was performed according to the departmental dose-optimization program which includes automated exposure control, adjustment of the mA and/or kV according to patient size and/or use of iterative reconstruction technique. COMPARISON:  Prior head CT examinations  07/13/2019 and earlier. FINDINGS: Brain: Mild generalized cerebral atrophy. Patchy and ill-defined hypoattenuation within the cerebral white matter, nonspecific but compatible with moderate chronic small vessel ischemic disease. There is no acute intracranial hemorrhage. No demarcated cortical infarct. No extra-axial fluid collection. No evidence of an intracranial mass. No midline shift. Vascular: No hyperdense vessel.  Atherosclerotic calcifications. Skull: No fracture or aggressive osseous lesion. Sinuses/Orbits: No mass or acute finding within the imaged orbits. Postsurgical appearance of the paranasal sinuses. Minimal mucosal thickening, and tiny mucous retention cyst or polyp, within the right maxillary sinus. Mild mucosal thickening within the bilateral ethmoid sinuses/ethmoidectomy cavities. Mucosal thickening within the inferior left frontal sinus. IMPRESSION: 1.  No evidence of an acute intracranial abnormality. 2. Moderate chronic small vessel ischemic changes within the cerebral white matter. 3. Mild generalized cerebral atrophy. 4. Paranasal sinus disease as described. Electronically Signed   By: Jackey Loge D.O.   On: 09/17/2022 11:14    Microbiology: Results for orders placed or performed during the hospital encounter of 04/02/16  Urine culture     Status: Abnormal   Collection Time: 04/02/16  4:51 PM   Specimen: Urine, Clean Catch  Result Value Ref Range Status   Specimen Description URINE, CLEAN CATCH  Final   Special Requests NONE  Final   Culture (A)  Final    <10,000 COLONIES/mL INSIGNIFICANT GROWTH Performed at [facility]    Report Status 04/03/2016 FINAL  Final    Labs: CBC: Recent Labs  Lab 09/17/22 1044  WBC 6.6  NEUTROABS 4.3  HGB 13.9  HCT 43.3  MCV 92.3  PLT 185   Basic Metabolic Panel: Recent Labs  Lab 09/17/22 1044  NA 135  K 4.0  CL 102  CO2 25  GLUCOSE 100*  BUN 16  CREATININE 0.60  CALCIUM 9.5   Liver Function Tests: Recent Labs   Lab 09/17/22 1044  AST 22  ALT 17  ALKPHOS 69  BILITOT 0.7  PROT 7.5  ALBUMIN 4.1   CBG: Recent Labs  Lab 09/17/22 1031  GLUCAP 101*    Discharge time spent: greater than 30 minutes.  Signed: Enedina Finner, MD [facility] 09/19/2022

## 2022-09-25 ENCOUNTER — Institutional Professional Consult (permissible substitution) (HOSPITAL_BASED_OUTPATIENT_CLINIC_OR_DEPARTMENT_OTHER): Payer: Medicare Other | Admitting: Internal Medicine
